# Patient Record
Sex: Male | Born: 1990 | Race: White | Hispanic: No | Marital: Single | Smoking: Never smoker
Health system: Southern US, Community
[De-identification: ages and names within clinical notes are randomized; demographics above are authoritative.]

## PROBLEM LIST (undated history)

## (undated) DIAGNOSIS — R42 Dizziness and giddiness: Secondary | ICD-10-CM

## (undated) DIAGNOSIS — F419 Anxiety disorder, unspecified: Secondary | ICD-10-CM

## (undated) DIAGNOSIS — R112 Nausea with vomiting, unspecified: Secondary | ICD-10-CM

## (undated) DIAGNOSIS — T4145XA Adverse effect of unspecified anesthetic, initial encounter: Secondary | ICD-10-CM

## (undated) DIAGNOSIS — B179 Acute viral hepatitis, unspecified: Secondary | ICD-10-CM

## (undated) DIAGNOSIS — B2710 Cytomegaloviral mononucleosis without complications: Secondary | ICD-10-CM

## (undated) DIAGNOSIS — T7840XA Allergy, unspecified, initial encounter: Secondary | ICD-10-CM

## (undated) DIAGNOSIS — F913 Oppositional defiant disorder: Secondary | ICD-10-CM

## (undated) DIAGNOSIS — R51 Headache: Secondary | ICD-10-CM

## (undated) DIAGNOSIS — R197 Diarrhea, unspecified: Secondary | ICD-10-CM

## (undated) DIAGNOSIS — K648 Other hemorrhoids: Secondary | ICD-10-CM

## (undated) DIAGNOSIS — F988 Other specified behavioral and emotional disorders with onset usually occurring in childhood and adolescence: Secondary | ICD-10-CM

## (undated) DIAGNOSIS — K3533 Acute appendicitis with perforation and localized peritonitis, with abscess: Secondary | ICD-10-CM

## (undated) DIAGNOSIS — Z9119 Patient's noncompliance with other medical treatment and regimen: Secondary | ICD-10-CM

## (undated) DIAGNOSIS — F411 Generalized anxiety disorder: Secondary | ICD-10-CM

## (undated) DIAGNOSIS — F319 Bipolar disorder, unspecified: Secondary | ICD-10-CM

## (undated) DIAGNOSIS — Z8489 Family history of other specified conditions: Secondary | ICD-10-CM

## (undated) DIAGNOSIS — L701 Acne conglobata: Secondary | ICD-10-CM

## (undated) DIAGNOSIS — K589 Irritable bowel syndrome without diarrhea: Secondary | ICD-10-CM

## (undated) HISTORY — DX: Generalized anxiety disorder: F41.1

## (undated) HISTORY — DX: Anxiety disorder, unspecified: F41.9

## (undated) HISTORY — DX: Irritable bowel syndrome, unspecified: K58.9

## (undated) HISTORY — DX: Allergy, unspecified, initial encounter: T78.40XA

## (undated) HISTORY — DX: Oppositional defiant disorder: F91.3

## (undated) HISTORY — DX: Acne conglobata: L70.1

## (undated) HISTORY — PX: KNEE SURGERY: SHX244

## (undated) HISTORY — DX: Acute appendicitis with perforation and localized peritonitis, with abscess: K35.33

## (undated) HISTORY — DX: Other specified behavioral and emotional disorders with onset usually occurring in childhood and adolescence: F98.8

## (undated) HISTORY — DX: Other hemorrhoids: K64.8

---
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
## 2013-09-23 ENCOUNTER — Other Ambulatory Visit: Payer: Self-pay | Admitting: Physician Assistant

## 2013-09-23 MED ORDER — SULFAMETHOXAZOLE-TRIMETHOPRIM 400-80 MG PO TABS: 1.0000 | ORAL_TABLET | Freq: Two times a day (BID) | ORAL | Status: AC

## 2013-10-24 ENCOUNTER — Other Ambulatory Visit: Payer: Self-pay | Admitting: Emergency Medicine

## 2013-11-04 ENCOUNTER — Other Ambulatory Visit: Payer: Self-pay | Admitting: Emergency Medicine

## 2013-11-07 DIAGNOSIS — F988 Other specified behavioral and emotional disorders with onset usually occurring in childhood and adolescence: Secondary | ICD-10-CM | POA: Insufficient documentation

## 2013-11-07 DIAGNOSIS — T7840XA Allergy, unspecified, initial encounter: Secondary | ICD-10-CM | POA: Insufficient documentation

## 2013-11-08 ENCOUNTER — Encounter: Payer: Self-pay | Admitting: Internal Medicine

## 2013-11-08 ENCOUNTER — Ambulatory Visit (INDEPENDENT_AMBULATORY_CARE_PROVIDER_SITE_OTHER): Payer: Self-pay | Admitting: Internal Medicine

## 2013-11-08 VITALS — BP 112/62 | HR 72 | Temp 98.2°F | Resp 18 | Wt 183.6 lb

## 2013-11-08 DIAGNOSIS — F913 Oppositional defiant disorder: Secondary | ICD-10-CM

## 2013-11-08 DIAGNOSIS — L701 Acne conglobata: Secondary | ICD-10-CM

## 2013-11-08 DIAGNOSIS — F319 Bipolar disorder, unspecified: Secondary | ICD-10-CM | POA: Insufficient documentation

## 2013-11-08 DIAGNOSIS — L708 Other acne: Secondary | ICD-10-CM

## 2013-11-08 HISTORY — DX: Acne conglobata: L70.1

## 2013-11-08 HISTORY — DX: Oppositional defiant disorder: F91.3

## 2013-11-08 MED ORDER — SULFAMETHOXAZOLE-TMP DS 800-160 MG PO TABS: 1.0000 | ORAL_TABLET | Freq: Two times a day (BID) | ORAL | Status: DC

## 2013-11-08 NOTE — Patient Instructions (Addendum)
Acne Acne is a skin problem that causes pimples. Acne occurs when the pores in your skin get blocked. Your pores may become red, sore, and swollen (inflamed), or infected with a common skin bacterium (Propionibacterium acnes). Acne is a common skin problem. Up to 80% of people get acne at some time. Acne is especially common from the ages of 76 to 25. Acne usually goes away over time with proper treatment. CAUSES  Your pores each contain an oil gland. The oil glands make an oily substance called sebum. Acne happens when these glands get plugged with sebum, dead skin cells, and dirt. The P. acnes bacteria that are normally found in the oil glands then multiply, causing inflammation. Acne is commonly triggered by changes in your hormones. These hormonal changes can cause the oil glands to get bigger and to make more sebum. Factors that can make acne worse include:  Hormone changes during adolescence.  Hormone changes during women's menstrual cycles.  Hormone changes during pregnancy.  Oil-based cosmetics and hair products.  Harshly scrubbing the skin.  Strong soaps.  Stress.  Hormone problems due to certain diseases.  Long or oily hair rubbing against the skin.  Certain medicines.  Pressure from headbands, backpacks, or shoulder pads.  Exposure to certain oils and chemicals. SYMPTOMS  Acne often occurs on the face, neck, chest, and upper back. Symptoms include:  Small, red bumps (pimples or papules).  Whiteheads (closed comedones).  Blackheads (open comedones).  Small, pus-filled pimples (pustules).  Big, red pimples or pustules that feel tender. More severe acne can cause:  An infected area that contains a collection of pus (abscess).  Hard, painful, fluid-filled sacs (cysts).  Scars. DIAGNOSIS  Your caregiver can usually tell what the problem is by doing a physical exam. TREATMENT  There are many good treatments for acne. Some are available over-the-counter and some  are available with a prescription. The treatment that is best for you depends on the type of acne you have and how severe it is. It may take 2 months of treatment before your acne gets better. Common treatments include:  Creams and lotions that prevent oil glands from clogging.  Creams and lotions that treat or prevent infections and inflammation.  Antibiotics applied to the skin or taken as a pill.  Pills that decrease sebum production.  Birth control pills.  Light or laser treatments.  Minor surgery.  Injections of medicine into the affected areas.  Chemicals that cause peeling of the skin. HOME CARE INSTRUCTIONS  Good skin care is the most important part of treatment.  Wash your skin gently at least twice a day and after exercise. Always wash your skin before bed.  Use mild soap.  After each wash, apply a water-based skin moisturizer.  Keep your hair clean and off of your face. Shampoo your hair daily.  Only take medicines as directed by your caregiver.  Use a sunscreen or sunblock with SPF 30 or greater. This is especially important when you are using acne medicines.  Choose cosmetics that are noncomedogenic. This means they do not plug the oil glands.  Avoid leaning your chin or forehead on your hands.  Avoid wearing tight headbands or hats.  Avoid picking or squeezing your pimples. This can make your acne worse and cause scarring. SEEK MEDICAL CARE IF:   Your acne is not better after 8 weeks.  Your acne gets worse.  You have a large area of skin that is red or tender. Document Released: 09/26/2000 Document  Revised: 12/22/2011 Document Reviewed: 07/18/2011 ExitCare Patient Information 2014 [place], [place].   Isotretinoin capsules What is this medicine? ISOTRETINOIN (eye soe TRET i noyn) treats severe acne that has not responded to other therapy like antibiotics. This medicine may be used for other purposes; ask your health care provider or pharmacist if you  have questions. COMMON BRAND NAME(S): Absorica, Accutane, Amnesteem , Claravis , MYORISAN, Sotret, ZENATANE What should I tell my health care provider before I take this medicine? They need to know if you have any of these conditions: -heart disease -high blood cholesterol or triglycerides -inflammatory bowel disease -liver disease -mental problems, such as depression, psychosis, attempted suicide, or a family history of mental problems -osteoporosis, osteomalacia, or other bone disorders -pancreatitis -an unusual or allergic reaction to isotretinoin, vitamin A or related drugs, parabens, other medicines, foods, dyes, or preservatives -pregnant or trying to get pregnant -breast-feeding How should I use this medicine? Take this medicine by mouth with a full glass of water. Follow the directions on the prescription label. Do not chew or suck on the capsules. Take your medicine at regular intervals. Do not take your medicine more often than directed. Do not stop taking except on your doctor's advice. A special MedGuide will be given to you by the pharmacist with each prescription and refill. Be sure to read this information carefully each time. Talk to your pediatrician regarding the use of this medicine in children. Special care may be needed. Overdosage: If you think you have taken too much of this medicine contact a poison control center or emergency room at once. NOTE: This medicine is only for you. Do not share this medicine with others. What if I miss a dose? If you miss a dose, skip that dose. Do not take double or extra doses. If you take more than your prescribed dose, call your doctor or poison control center right away. What may interact with this medicine? Do not take this medicine with any of the following medications: -vitamins and other supplements containing vitamin A This medicine may also interact with the following medications: -alcohol -benzoyl peroxide, salicylic acid, or  other drying medicines used for acne -medicines for seizures -orlistat -other drugs that make you more sensitive to the sun such as sulfa drugs -progestin-only birth control hormones -st. john's wort -steroid medicines like prednisone or cortisone -tetracycline antibiotics like doxycycline and tetracycline -warfarin This list may not describe all possible interactions. Give your health care provider a list of all the medicines, herbs, non-prescription drugs, or dietary supplements you use. Also tell them if you smoke, drink alcohol, or use illegal drugs. Some items may interact with your medicine. What should I watch for while using this medicine? You may experience a flare in your acne during the initial treatment period. You will need to see your doctor or health care professional monthly to get a new prescription and to check on your progress and for side effects. To receive this medicine, you, your doctor and your pharmacy must be registered in the [facility] program. You may only receive up to a 30 day supply of this medicine at one time. You will need a new prescription for each refill. Your prescription must be filled within 7 days of your doctor's office visit. This medicine can cause birth defects. Do not get pregnant while taking this drug. Females will need to have 2 negative pregnancy tests before starting this medicine and then monthly pregnancy tests during treatment, even if you are not sexually active.  Use 2 reliable forms of birth control together for 1 month prior to, during, and for 1 month after stopping this medicine. Avoid using birth control pills that do not contain estrogen. They may not work while you are taking this medicine. If you become pregnant, miss a menstrual cycle, or stop using birth control, you must immediately stop taking this medicine. If you are pregnant, report it to FDA MedWatch at 1-800-FDA-1088 and the [employer] pregnancy registry at [phone]. Severe birth  defects may occur even if just one dose is taken. Do not breast-feed while taking this medicine or for 1 month after stopping treatment. Do not give blood while taking this medicine and for 30 days after completion of treatment to avoid exposing pregnant women to this medicine through the donated blood. Some patients have become depressed or developed serious mental problems while taking this medicine or soon after stopping. Stop taking this medicine if you start feeling depressed or have thoughts of violence or suicide. Contact your doctor. This medicine can increase cholesterol and triglyceride levels and decrease HDL (the good cholesterol) levels. Your health care provider will monitor these levels and recommend appropriate therapy, including changes in diet or prescription drugs, if necessary. Alcohol can increase the risk of developing high cholesterol or high blood lipids. Avoid alcoholic drinks while you are taking this medicine. If you wear contact lenses, they may feel uncomfortable. If your eyes get dry, check with your eye doctor. This medicine may decrease your night vision or cause other changes in vision. If you experience any change in vision, stop taking this medicine and see an eye doctor. This medicine can make you more sensitive to the sun. Keep out of the sun. If you cannot avoid being in the sun, wear protective clothing and use sunscreen. Do not use sun lamps or tanning beds/booths. Cosmetic procedures to smooth your skin including waxing, dermabrasion, or laser therapy should be avoided during therapy and for at least 6 months after you stop because of the possibility of scarring. Check with your health care provider for advice about when you can have cosmetic procedures. This medicine may affect your blood sugar levels. If you have diabetes check with your doctor or health care professional if you notice any change in your blood sugar tests. What side effects may I notice from  receiving this medicine? Side effects that you should report to your doctor or health care professional as soon as possible: -allergic reactions like skin rash, itching or hives, swelling of the face, lips, or tongue -breathing problems -changes in menstrual cycle -changes in vision, like blurred or double vision or decreased night vision -chest pain -depression -dizziness -fainting -hearing loss or ringing in the ears -hives, skin rash -increased irritability, anger, aggression or thoughts of violence -increased urination and/or thirst or dark urine -irregular heartbeat -loss of interest in usual activities -muscle or joint pain -muscle weakness with or without pain -nausea and vomiting -severe diarrhea -severe headache -severe stomach pain -slurred speech or trouble swallowing -start to have thoughts about hurting yourself -swelling of face or mouth -unusual bruising or bleeding -yellowing of the eyes or skin Side effects that usually do not require medical attention (report to your doctor or health care professional if they continue or are bothersome): -chapped lips -dry mouth, nose or skin -flushing -hair loss, increased fragility of hair -headache (mild) This list may not describe all possible side effects. Call your doctor for medical advice about side effects. You may report side effects  to FDA at 1-800-FDA-1088. Where should I keep my medicine? Keep out of the reach of children. Store at room temperature between 15 and 30 degrees C (59 and 86 degrees F). Throw away any unused medicine after the expiration date. NOTE: This sheet is a summary. It may not cover all possible information. If you have questions about this medicine, talk to your doctor, pharmacist, or health care provider.  2014, Elsevier/Gold Standard. (2008-02-15 16:57:13)

## 2013-11-08 NOTE — Progress Notes (Signed)
   Subjective:    Patient ID: Blake Bradley, male    DOB: 11/15/1990, 23 y.o.   MRN: [id number]  HPI Blake Bradley presents today for f/u of his acne. In the past he has been on various Abx's and more recently he had bee tried on low dose alternate day dosing with some limited success.     Review of Systems 12 pt systems review neg to above.     Objective:   Physical Exam Skin exam reveals mild cystic acne of the face and upper back. No signs of cellulitis or abcesses.       Assessment & Plan:   1. Acne conglobata  - sulfamethoxazole-trimethoprim (BACTRIM DS) 800-160 MG per tablet; Take 1 tablet by mouth 2 (two) times daily. With food for Acne  Dispense: 60 tablet; Refill: 5  - Patient was advised that when he does procure insurance it would be reasonable to pursue a Dermatology evaluation

## 2013-11-09 ENCOUNTER — Other Ambulatory Visit: Payer: Self-pay | Admitting: Emergency Medicine

## 2013-11-09 ENCOUNTER — Telehealth: Payer: Self-pay | Admitting: *Deleted

## 2013-11-09 NOTE — Telephone Encounter (Signed)
Patient's parent called and requested 1 tab of Dexamethasone for rash onhands,back,face and legs.  Per Dr Oneta RackMcKeown, no rx and try Benadryl until OV on [id number].  Father aware.

## 2013-11-10 ENCOUNTER — Ambulatory Visit: Payer: Self-pay | Admitting: Internal Medicine

## 2013-11-10 ENCOUNTER — Encounter: Payer: Self-pay | Admitting: Internal Medicine

## 2013-11-10 VITALS — BP 112/66 | HR 52 | Temp 99.0°F | Resp 16 | Wt 182.5 lb

## 2013-11-10 DIAGNOSIS — L701 Acne conglobata: Secondary | ICD-10-CM

## 2013-11-10 NOTE — Progress Notes (Signed)
   Subjective:    Patient ID: Blake Bradley, male    DOB: 04/12/1991, 23 y.o.   MRN: [id number]  HPI  Blake Bradley was seen 2 days ago and Rx'd  SXT-DS for acne an d called yesterday claiming an allergic reaction or worsening of the acne on his back    Review of Systems Neg      Objective:   Physical Exam Facial acne appears about the same. Acne rash of back also about the same        Assessment & Plan:   Acne (706.1)  Reassured not an allergic reaction and that sometimes starting an Abx may transiently exacerbate the acneiform rash.  Has appt in 10 days with a Dermatoloist - Dr Yetta BarreJones

## 2013-11-11 ENCOUNTER — Ambulatory Visit: Payer: Self-pay | Admitting: Emergency Medicine

## 2014-04-06 ENCOUNTER — Encounter: Payer: Self-pay | Admitting: Internal Medicine

## 2014-04-17 ENCOUNTER — Ambulatory Visit: Payer: Self-pay | Admitting: Physician Assistant

## 2014-04-18 ENCOUNTER — Encounter: Payer: Self-pay | Admitting: Internal Medicine

## 2014-04-18 ENCOUNTER — Ambulatory Visit: Payer: Self-pay | Admitting: Internal Medicine

## 2014-04-18 VITALS — BP 116/74 | HR 56 | Temp 98.4°F | Resp 16 | Ht 73.0 in | Wt 161.2 lb

## 2014-04-18 DIAGNOSIS — N503 Cyst of epididymis: Secondary | ICD-10-CM

## 2014-04-18 NOTE — Progress Notes (Signed)
   Subjective:    Patient ID: Blake Bradley, male    DOB: 02/17/1991, 23 y.o.   MRN: [id number]  HPI Patient present with concern Re: small BB sized sl tender lump of the right epididymal area.  Review of Systems neg  Objective:   Physical Exam BP 116/74  Pulse 56  Temp(Src) 98.4 F (36.9 C) (Temporal)  Resp 16  Ht 6\' 1"  (1.854 m)  Wt 161 lb 3.2 oz (73.12 kg)  BMI 21.27 kg/m2 Exam focused to the genital areas finds normal male, no inguinal hernias, bilat. testes and cords normal & unremarkable with finding of a firm 2-3 mm sized firm nodule of the right epididymis.  Assessment & Plan:   1. Epididymal cyst - patient reassured not likely cancer. - patient instructed in STE & given handout for same Yaw Testicular Exam. - ROV prn.

## 2014-06-18 ENCOUNTER — Encounter (HOSPITAL_COMMUNITY): Payer: Self-pay | Admitting: Emergency Medicine

## 2014-06-18 ENCOUNTER — Emergency Department (INDEPENDENT_AMBULATORY_CARE_PROVIDER_SITE_OTHER)
Admission: EM | Admit: 2014-06-18 | Discharge: 2014-06-18 | Disposition: A | Discharge status: Home / Self Care | Payer: Medicaid Other | Source: Home / Self Care | Attending: Family Medicine | Admitting: Family Medicine

## 2014-06-18 DIAGNOSIS — K529 Noninfective gastroenteritis and colitis, unspecified: Secondary | ICD-10-CM

## 2014-06-18 DIAGNOSIS — E860 Dehydration: Secondary | ICD-10-CM

## 2014-06-18 DIAGNOSIS — K5289 Other specified noninfective gastroenteritis and colitis: Secondary | ICD-10-CM

## 2014-06-18 LAB — POCT URINALYSIS DIP (DEVICE)
GLUCOSE, UA: NEGATIVE mg/dL
Hgb urine dipstick: NEGATIVE
LEUKOCYTES UA: NEGATIVE
NITRITE: NEGATIVE
PH: 6 (ref 5.0–8.0)
Protein, ur: >=300 mg/dL — AB
Specific Gravity, Urine: 1.025 (ref 1.005–1.030)
Urobilinogen, UA: 0.2 mg/dL (ref 0.0–1.0)

## 2014-06-18 MED ORDER — ONDANSETRON HCL 4 MG/2ML IJ SOLN
8.0000 mg | Freq: Once | INTRAMUSCULAR | Status: AC
  Administered 2014-06-18: 8 mg via INTRAVENOUS

## 2014-06-18 MED ORDER — ONDANSETRON 4 MG PO TBDP: 4.0000 mg | ORAL_TABLET | Freq: Three times a day (TID) | ORAL | Status: DC | PRN

## 2014-06-18 MED ORDER — SODIUM CHLORIDE 0.9 % IV SOLN
Freq: Once | INTRAVENOUS | Status: AC
  Administered 2014-06-18 (×2): via INTRAVENOUS

## 2014-06-18 MED ORDER — SODIUM CHLORIDE 0.9 % IV SOLN: Freq: Once | INTRAVENOUS | Status: DC

## 2014-06-18 MED ORDER — ONDANSETRON HCL 4 MG/2ML IJ SOLN
INTRAMUSCULAR | Status: AC
  Filled 2014-06-18: qty 4

## 2014-06-18 NOTE — ED Provider Notes (Signed)
CSN: [id number]     Arrival date & time 06/18/14  1204 History   First MD Initiated Contact with Patient 06/18/14 1214     Chief Complaint  Patient presents with  . Diarrhea   (Consider location/radiation/quality/duration/timing/severity/associated sxs/prior Treatment) HPI Comments: Patient reports 4 days of N/V/D with associated abdominal cramping and anorexia. Patient's mother had been ill with same for a few days prior to patient. Reports himself to be otherwise healthy. No hx of abdominal surgery. No fever. Does not smoke or drink. Is unemployed.  PCP: Dr. Rosalita Bradley  Patient is a 23 y.o. male presenting with vomiting. The history is provided by the patient.  Emesis Severity:  Moderate Duration:  4 days Timing:  Constant Quality:  Stomach contents   Past Medical History  Diagnosis Date  . Allergy   . ADD (attention deficit disorder)    Past Surgical History  Procedure Laterality Date  . Knee surgery      recurrent spitz tumor   Family History  Problem Relation Age of Onset  . Cancer Mother     hx breast cancer   History  Substance Use Topics  . Smoking status: Never Smoker   . Smokeless tobacco: Not on file  . Alcohol Use: No    Review of Systems  Gastrointestinal: Positive for vomiting.  All other systems reviewed and are negative.   Allergies  Review of patient's allergies indicates no known allergies.  Home Medications   Prior to Admission medications   Medication Sig Start Date End Date Taking? Authorizing Provider  ISOtretinoin (ACCUTANE) 40 MG capsule Take 40 mg by mouth daily.    Historical Provider, MD  ondansetron (ZOFRAN ODT) 4 MG disintegrating tablet Take 1 tablet (4 mg total) by mouth every 8 (eight) hours as needed for nausea or vomiting. 06/18/14   Jess Barters H Kassandra Meriweather, PA   BP 133/80  Pulse 101  Temp(Src) 100 F (37.8 C) (Oral)  Resp 14  SpO2 99% Physical Exam  Nursing note and vitals reviewed. Constitutional: He is oriented to  person, place, and time. He appears well-developed and well-nourished. No distress.  HENT:  Head: Normocephalic and atraumatic.  Eyes: Conjunctivae are normal. No scleral icterus.  Cardiovascular: Normal rate, regular rhythm and normal heart sounds.   Pulmonary/Chest: Effort normal and breath sounds normal. No respiratory distress. He has no wheezes.  Abdominal: Normal appearance. He exhibits no distension and no mass. Bowel sounds are increased. There is no CVA tenderness.  Mild diffuse tenderness and cramping  Musculoskeletal: Normal range of motion.  Neurological: He is alert and oriented to person, place, and time.  Skin: Skin is dry. No rash noted.  Psychiatric: He has a normal mood and affect. His behavior is normal.    ED Course  Procedures (including critical care time) Labs Review Labs Reviewed  POCT URINALYSIS DIP (DEVICE) - Abnormal; Notable for the following:    Bilirubin Urine SMALL (*)    Ketones, ur TRACE (*)    Protein, ur >=300 (*)    All other components within normal limits    Imaging Review No results found.   MDM   1. Gastroenteritis   2. Dehydration    Patient provided with 1L NS IV at [facility] with 8 mg IV dose of Zofran. Following fluids and meds, patient given oral fluid challenge. Patient tolerated oral fluids and states he is feeling better. Will discahrge home with Rx for ODT zofran and instructions for oral rehydration and advised to have himself  reassessed if symptoms do not begin to improve over next several days.   UA with proteinuria and trace ketonuria with normal spec Blake Bradley, [place] 06/18/14 1446  Addendum :05pm 06/18/2014: Istat 8 with mild hyponatremia (132) and hyperglycemia (129), otherwise unremarkable.   Ria Clock, [place] 06/18/14 [phone]

## 2014-06-18 NOTE — ED Notes (Signed)
Pt  Has  Had  4  Days  Of  Nausea/  Vomiting    /  Diarrhea       Has  Not vomited  Today    But did last pm      Feels  Weak  And       Mucous  membranes  Are  Dry

## 2014-06-18 NOTE — ED Notes (Signed)
Iv  Ns 1  Liter    Bolus       r  Arm      20  Angio  Site  patent

## 2014-06-18 NOTE — Discharge Instructions (Signed)
Dehydration, Adult °Dehydration is when you lose more fluids from the body than you take in. Vital organs like the kidneys, brain, and heart cannot function without a proper amount of fluids and salt. Any loss of fluids from the body can cause dehydration.  °CAUSES  °· Vomiting. °· Diarrhea. °· Excessive sweating. °· Excessive urine output. °· Fever. °SYMPTOMS  °Mild dehydration °· Thirst. °· Dry lips. °· Slightly dry mouth. °Moderate dehydration °· Very dry mouth. °· Sunken eyes. °· Skin does not bounce back quickly when lightly pinched and released. °· Dark urine and decreased urine production. °· Decreased tear production. °· Headache. °Severe dehydration °· Very dry mouth. °· Extreme thirst. °· Rapid, weak pulse (more than 100 beats per minute at rest). °· Cold hands and feet. °· Not able to sweat in spite of heat and temperature. °· Rapid breathing. °· Blue lips. °· Confusion and lethargy. °· Difficulty being awakened. °· Minimal urine production. °· No tears. °DIAGNOSIS  °Your caregiver will diagnose dehydration based on your symptoms and your exam. Blood and urine tests will help confirm the diagnosis. The diagnostic evaluation should also identify the cause of dehydration. °TREATMENT  °Treatment of mild or moderate dehydration can often be done at home by increasing the amount of fluids that you drink. It is best to drink small amounts of fluid more often. Drinking too much at one time can make vomiting worse. Refer to the home care instructions below. °Severe dehydration needs to be treated at the hospital where you will probably be given intravenous (IV) fluids that contain water and electrolytes. °HOME CARE INSTRUCTIONS  °· Ask your caregiver about specific rehydration instructions. °· Drink enough fluids to keep your urine clear or pale yellow. °· Drink small amounts frequently if you have nausea and vomiting. °· Eat as you normally do. °· Avoid: °¨ Foods or drinks high in sugar. °¨ Carbonated  drinks. °¨ Juice. °¨ Extremely hot or cold fluids. °¨ Drinks with caffeine. °¨ Fatty, greasy foods. °¨ Alcohol. °¨ Tobacco. °¨ Overeating. °¨ Gelatin desserts. °· Wash your hands well to avoid spreading bacteria and viruses. °· Only take over-the-counter or prescription medicines for pain, discomfort, or fever as directed by your caregiver. °· Ask your caregiver if you should continue all prescribed and over-the-counter medicines. °· Keep all follow-up appointments with your caregiver. °SEEK MEDICAL CARE IF: °· You have abdominal pain and it increases or stays in one area (localizes). °· You have a rash, stiff neck, or severe headache. °· You are irritable, sleepy, or difficult to awaken. °· You are weak, dizzy, or extremely thirsty. °SEEK IMMEDIATE MEDICAL CARE IF:  °· You are unable to keep fluids down or you get worse despite treatment. °· You have frequent episodes of vomiting or diarrhea. °· You have blood or green matter (bile) in your vomit. °· You have blood in your stool or your stool looks black and tarry. °· You have not urinated in 6 to 8 hours, or you have only urinated a small amount of very dark urine. °· You have a fever. °· You faint. °MAKE SURE YOU:  °· Understand these instructions. °· Will watch your condition. °· Will get help right away if you are not doing well or get worse. °Document Released: 09/29/2005 Document Revised: 12/22/2011 Document Reviewed: 05/19/2011 °[employer]® Patient Information ©2015 [employer]. This information is not intended to replace advice given to you by your health care provider. Make sure you discuss any questions you have with your health care   provider.  Rehydration, Adult Rehydration is the replacement of body fluids lost during dehydration. Dehydration is an extreme loss of body fluids to the point of body function impairment. There are many ways extreme fluid loss can occur, including vomiting, diarrhea, or excess sweating. Recovering from dehydration  requires replacing lost fluids, continuing to eat to maintain strength, and avoiding foods and beverages that may contribute to further fluid loss or may increase nausea. HOW TO REHYDRATE In most cases, rehydration involves the replacement of not only fluids but also carbohydrates and basic body salts. Rehydration with an oral rehydration solution is one way to replace essential nutrients lost through dehydration. An oral rehydration solution can be purchased at pharmacies, retail stores, and online. Premixed packets of powder that you combine with water to make a solution are also sold. You can prepare an oral rehydration solution at home by mixing the following ingredients together:    - tsp table salt.   tsp baking soda.   tsp salt substitute containing potassium chloride.  1 tablespoons sugar.  1 L (34 oz) of water. Be sure to use exact measurements. Including too much sugar can make diarrhea worse. Drink -1 cup (120-240 mL) of oral rehydration solution each time you have diarrhea or vomit. If drinking this amount makes your vomiting worse, try drinking smaller amounts more often. For example, drink 1-3 tsp every 5-10 minutes.  A general rule for staying hydrated is to drink 1-2 L of fluid per day. Talk to your caregiver about the specific amount you should be drinking each day. Drink enough fluids to keep your urine clear or pale yellow. EATING WHEN DEHYDRATED Even if you have had severe sweating or you are having diarrhea, do not stop eating. Many healthy items in a normal diet are okay to continue eating while recovering from dehydration. The following tips can help you to lessen nausea when you eat:  Ask someone else to prepare your food. Cooking smells may worsen nausea.  Eat in a well-ventilated room away from cooking smells.  Sit up when you eat. Avoid lying down until 1-2 hours after eating.  Eat small amounts when you eat.  Eat foods that are easy to digest. These include  soft, well-cooked, or mashed foods. FOODS AND BEVERAGES TO AVOID Avoid eating or drinking the following foods and beverages that may increase nausea or further loss of fluid:   Fruit juices with a high sugar content, such as concentrated juices.  Alcohol.  Beverages containing caffeine.  Carbonated drinks. They may cause a lot of gas.  Foods that may cause a lot of gas, such as cabbage, broccoli, and beans.  Fatty, greasy, and fried foods.  Spicy, very salty, and very sweet foods or drinks.  Foods or drinks that are very hot or very cold. Consume food or drinks at or near room temperature.  Foods that need a lot of chewing, such as raw vegetables.  Foods that are sticky or hard to swallow, such as peanut butter. Document Released: 12/22/2011 Document Revised: 06/23/2012 Document Reviewed: 12/22/2011 [facility] Patient Information 2015 [place], [place]. This information is not intended to replace advice given to you by your health care provider. Make sure you discuss any questions you have with your health care provider.  Viral Gastroenteritis Viral gastroenteritis is also known as stomach flu. This condition affects the stomach and intestinal tract. It can cause sudden diarrhea and vomiting. The illness typically lasts 3 to 8 days. Most people develop an immune response that eventually  gets rid of the virus. While this natural response develops, the virus can make you quite ill. CAUSES  Many different viruses can cause gastroenteritis, such as rotavirus or noroviruses. You can catch one of these viruses by consuming contaminated food or water. You may also catch a virus by sharing utensils or other personal items with an infected person or by touching a contaminated surface. SYMPTOMS  The most common symptoms are diarrhea and vomiting. These problems can cause a severe loss of body fluids (dehydration) and a body salt (electrolyte) imbalance. Other symptoms may  include:  Fever.  Headache.  Fatigue.  Abdominal pain. DIAGNOSIS  Your caregiver can usually diagnose viral gastroenteritis based on your symptoms and a physical exam. A stool sample may also be taken to test for the presence of viruses or other infections. TREATMENT  This illness typically goes away on its own. Treatments are aimed at rehydration. The most serious cases of viral gastroenteritis involve vomiting so severely that you are not able to keep fluids down. In these cases, fluids must be given through an intravenous line (IV). HOME CARE INSTRUCTIONS   Drink enough fluids to keep your urine clear or pale yellow. Drink small amounts of fluids frequently and increase the amounts as tolerated.  Ask your caregiver for specific rehydration instructions.  Avoid:  Foods high in sugar.  Alcohol.  Carbonated drinks.  Tobacco.  Juice.  Caffeine drinks.  Extremely hot or cold fluids.  Fatty, greasy foods.  Too much intake of anything at one time.  Dairy products until 24 to 48 hours after diarrhea stops.  You may consume probiotics. Probiotics are active cultures of beneficial bacteria. They may lessen the amount and number of diarrheal stools in adults. Probiotics can be found in yogurt with active cultures and in supplements.  Wash your hands well to avoid spreading the virus.  Only take over-the-counter or prescription medicines for pain, discomfort, or fever as directed by your caregiver. Do not give aspirin to children. Antidiarrheal medicines are not recommended.  Ask your caregiver if you should continue to take your regular prescribed and over-the-counter medicines.  Keep all follow-up appointments as directed by your caregiver. SEEK IMMEDIATE MEDICAL CARE IF:   You are unable to keep fluids down.  You do not urinate at least once every 6 to 8 hours.  You develop shortness of breath.  You notice blood in your stool or vomit. This may look like coffee  grounds.  You have abdominal pain that increases or is concentrated in one small area (localized).  You have persistent vomiting or diarrhea.  You have a fever.  The patient is a child younger than 3 months, and he or she has a fever.  The patient is a child older than 3 months, and he or she has a fever and persistent symptoms.  The patient is a child older than 3 months, and he or she has a fever and symptoms suddenly get worse.  The patient is a baby, and he or she has no tears when crying. MAKE SURE YOU:   Understand these instructions.  Will watch your condition.  Will get help right away if you are not doing well or get worse. Document Released: 09/29/2005 Document Revised: 12/22/2011 Document Reviewed: 07/16/2011 [facility] Patient Information 2015 [place], [place]. This information is not intended to replace advice given to you by your health care provider. Make sure you discuss any questions you have with your health care provider.

## 2014-06-19 ENCOUNTER — Emergency Department (INDEPENDENT_AMBULATORY_CARE_PROVIDER_SITE_OTHER)
Admission: EM | Admit: 2014-06-19 | Discharge: 2014-06-19 | Disposition: A | Discharge status: Nursing Home (Includes ICF) | Payer: Medicaid Other | Source: Home / Self Care

## 2014-06-19 ENCOUNTER — Inpatient Hospital Stay (HOSPITAL_COMMUNITY)
Admission: EM | Admit: 2014-06-19 | Discharge: 2014-06-28 | DRG: 392 | Disposition: A | Discharge status: Home / Self Care | Payer: Medicaid Other | Attending: Internal Medicine | Admitting: Internal Medicine

## 2014-06-19 ENCOUNTER — Encounter (HOSPITAL_COMMUNITY): Payer: Self-pay | Admitting: Emergency Medicine

## 2014-06-19 ENCOUNTER — Emergency Department (HOSPITAL_COMMUNITY): Payer: Medicaid Other

## 2014-06-19 DIAGNOSIS — F988 Other specified behavioral and emotional disorders with onset usually occurring in childhood and adolescence: Secondary | ICD-10-CM

## 2014-06-19 DIAGNOSIS — R809 Proteinuria, unspecified: Secondary | ICD-10-CM | POA: Diagnosis present

## 2014-06-19 DIAGNOSIS — F319 Bipolar disorder, unspecified: Secondary | ICD-10-CM

## 2014-06-19 DIAGNOSIS — L701 Acne conglobata: Secondary | ICD-10-CM

## 2014-06-19 DIAGNOSIS — R1115 Cyclical vomiting syndrome unrelated to migraine: Secondary | ICD-10-CM

## 2014-06-19 DIAGNOSIS — R197 Diarrhea, unspecified: Secondary | ICD-10-CM

## 2014-06-19 DIAGNOSIS — R1084 Generalized abdominal pain: Secondary | ICD-10-CM

## 2014-06-19 DIAGNOSIS — K56609 Unspecified intestinal obstruction, unspecified as to partial versus complete obstruction: Secondary | ICD-10-CM | POA: Diagnosis not present

## 2014-06-19 DIAGNOSIS — R509 Fever, unspecified: Secondary | ICD-10-CM

## 2014-06-19 DIAGNOSIS — R638 Other symptoms and signs concerning food and fluid intake: Secondary | ICD-10-CM

## 2014-06-19 DIAGNOSIS — R112 Nausea with vomiting, unspecified: Secondary | ICD-10-CM | POA: Diagnosis present

## 2014-06-19 DIAGNOSIS — L509 Urticaria, unspecified: Secondary | ICD-10-CM | POA: Diagnosis not present

## 2014-06-19 DIAGNOSIS — L708 Other acne: Secondary | ICD-10-CM | POA: Diagnosis present

## 2014-06-19 DIAGNOSIS — K56600 Partial intestinal obstruction, unspecified as to cause: Secondary | ICD-10-CM | POA: Diagnosis present

## 2014-06-19 DIAGNOSIS — K529 Noninfective gastroenteritis and colitis, unspecified: Secondary | ICD-10-CM

## 2014-06-19 DIAGNOSIS — A09 Infectious gastroenteritis and colitis, unspecified: Principal | ICD-10-CM | POA: Diagnosis present

## 2014-06-19 DIAGNOSIS — R109 Unspecified abdominal pain: Secondary | ICD-10-CM

## 2014-06-19 DIAGNOSIS — F913 Oppositional defiant disorder: Secondary | ICD-10-CM

## 2014-06-19 DIAGNOSIS — R1110 Vomiting, unspecified: Secondary | ICD-10-CM

## 2014-06-19 DIAGNOSIS — E876 Hypokalemia: Secondary | ICD-10-CM | POA: Diagnosis present

## 2014-06-19 DIAGNOSIS — R933 Abnormal findings on diagnostic imaging of other parts of digestive tract: Secondary | ICD-10-CM

## 2014-06-19 LAB — CBC WITH DIFFERENTIAL/PLATELET
BASOS PCT: 0 % (ref 0–1)
Basophils Absolute: 0 10*3/uL (ref 0.0–0.1)
Eosinophils Absolute: 0.1 10*3/uL (ref 0.0–0.7)
Eosinophils Relative: 1 % (ref 0–5)
HEMATOCRIT: 42.3 % (ref 39.0–52.0)
HEMOGLOBIN: 15.2 g/dL (ref 13.0–17.0)
LYMPHS PCT: 5 % — AB (ref 12–46)
Lymphs Abs: 0.7 10*3/uL (ref 0.7–4.0)
MCH: 30.3 pg (ref 26.0–34.0)
MCHC: 35.9 g/dL (ref 30.0–36.0)
MCV: 84.4 fL (ref 78.0–100.0)
MONOS PCT: 9 % (ref 3–12)
Monocytes Absolute: 1.2 10*3/uL — ABNORMAL HIGH (ref 0.1–1.0)
NEUTROS ABS: 10.6 10*3/uL — AB (ref 1.7–7.7)
NEUTROS PCT: 85 % — AB (ref 43–77)
Platelets: 211 10*3/uL (ref 150–400)
RBC: 5.01 MIL/uL (ref 4.22–5.81)
RDW: 12.5 % (ref 11.5–15.5)
WBC: 12.6 10*3/uL — AB (ref 4.0–10.5)

## 2014-06-19 LAB — URINALYSIS, ROUTINE W REFLEX MICROSCOPIC
GLUCOSE, UA: NEGATIVE mg/dL
Hgb urine dipstick: NEGATIVE
KETONES UR: 15 mg/dL — AB
Nitrite: NEGATIVE
PH: 6 (ref 5.0–8.0)
Protein, ur: 100 mg/dL — AB
Specific Gravity, Urine: 1.031 — ABNORMAL HIGH (ref 1.005–1.030)
Urobilinogen, UA: 1 mg/dL (ref 0.0–1.0)

## 2014-06-19 LAB — COMPREHENSIVE METABOLIC PANEL
ALBUMIN: 3.5 g/dL (ref 3.5–5.2)
ALK PHOS: 93 U/L (ref 39–117)
ALT: 21 U/L (ref 0–53)
AST: 26 U/L (ref 0–37)
Anion gap: 16 — ABNORMAL HIGH (ref 5–15)
BILIRUBIN TOTAL: 1.1 mg/dL (ref 0.3–1.2)
BUN: 15 mg/dL (ref 6–23)
CHLORIDE: 97 meq/L (ref 96–112)
CO2: 24 meq/L (ref 19–32)
Calcium: 9.3 mg/dL (ref 8.4–10.5)
Creatinine, Ser: 1.08 mg/dL (ref 0.50–1.35)
GFR calc Af Amer: >90 mL/min (ref >90)
Glucose, Bld: 98 mg/dL (ref 70–99)
POTASSIUM: 3.6 meq/L — AB (ref 3.7–5.3)
Sodium: 137 mEq/L (ref 137–147)
Total Protein: 7.8 g/dL (ref 6.0–8.3)

## 2014-06-19 LAB — URINE MICROSCOPIC-ADD ON

## 2014-06-19 LAB — I-STAT CG4 LACTIC ACID, ED: LACTIC ACID, VENOUS: 1.24 mmol/L (ref 0.5–2.2)

## 2014-06-19 LAB — LIPASE, BLOOD: Lipase: 38 U/L (ref 11–59)

## 2014-06-19 MED ORDER — HYDROMORPHONE HCL PF 1 MG/ML IJ SOLN
1.0000 mg | Freq: Once | INTRAMUSCULAR | Status: AC
  Administered 2014-06-19: 1 mg via INTRAVENOUS
  Filled 2014-06-19: qty 1

## 2014-06-19 MED ORDER — IOHEXOL 300 MG/ML  SOLN: 25.0000 mL | INTRAMUSCULAR | Status: AC

## 2014-06-19 MED ORDER — MORPHINE SULFATE 4 MG/ML IJ SOLN
4.0000 mg | Freq: Once | INTRAMUSCULAR | Status: AC
  Administered 2014-06-19: 4 mg via INTRAVENOUS
  Filled 2014-06-19: qty 1

## 2014-06-19 MED ORDER — HYDROMORPHONE HCL PF 1 MG/ML IJ SOLN
1.0000 mg | Freq: Once | INTRAMUSCULAR | Status: AC
  Administered 2014-06-20: 1 mg via INTRAVENOUS
  Filled 2014-06-19: qty 1

## 2014-06-19 MED ORDER — ONDANSETRON HCL 4 MG/2ML IJ SOLN
4.0000 mg | Freq: Once | INTRAMUSCULAR | Status: AC
  Administered 2014-06-19: 4 mg via INTRAVENOUS
  Filled 2014-06-19: qty 2

## 2014-06-19 MED ORDER — SODIUM CHLORIDE 0.9 % IV BOLUS (SEPSIS)
2000.0000 mL | Freq: Once | INTRAVENOUS | Status: AC
  Administered 2014-06-19: 2000 mL via INTRAVENOUS

## 2014-06-19 MED ORDER — ONDANSETRON HCL 4 MG/2ML IJ SOLN
4.0000 mg | Freq: Once | INTRAMUSCULAR | Status: AC
  Administered 2014-06-20: 4 mg via INTRAVENOUS
  Filled 2014-06-19: qty 2

## 2014-06-19 MED ORDER — IOHEXOL 300 MG/ML  SOLN
100.0000 mL | Freq: Once | INTRAMUSCULAR | Status: AC | PRN
  Administered 2014-06-19: 100 mL via INTRAVENOUS

## 2014-06-19 NOTE — ED Notes (Signed)
Patient/father refused ambulance transport.  Father transferring patient to ed.  Understands to go to ed now, no food, no drinking.

## 2014-06-19 NOTE — ED Provider Notes (Signed)
Medical screening examination/treatment/procedure(s) were performed by non-physician practitioner and as supervising physician I was immediately available for consultation/collaboration.  Haizel Gatchell, M.D.  Rithik Odea C Thailyn Khalid, MD 06/19/14 1801 

## 2014-06-19 NOTE — ED Notes (Signed)
Pt reports that since Thursday he has had abd pain and vomiting. Reports that he went to [facility] yesterday and given medication. FAmily reports that he has not gotten any better and unable to hold anything down. REports that patient is pale and diaphoretic at home.

## 2014-06-19 NOTE — ED Provider Notes (Signed)
CSN: [id number]     Arrival date & time 06/19/14  1708 History   First MD Initiated Contact with Patient 06/19/14 1853     Chief Complaint  Patient presents with  . Abdominal Pain  . Fever     (Consider location/radiation/quality/duration/timing/severity/associated sxs/prior Treatment) The history is provided by the patient and medical records. No language interpreter was used.    Carlisle Harbuck is a 23 y.o. male  with a hx of allergic rhinitis presents to the Emergency Department complaining of gradual, persistent, progressively worsening lower abd pain onset 2 days ago. Associated symptoms include N/V/D everyday since then.  Pt was seen at [facility] yesterday, given fluids, zofran and sent home.  Patient reports is unable to tolerate by mouth for several days.  Patient denies urinary symptoms, penile discharge, testicular pain, penile pain. No history of abdominal surgeries.  Family of patient reports fevers of 103 with diaphoresis at home.  Patient reports that pain is better with sitting or curling up but worsened with lying flat or standing. Patient reports that the bumps in the car ride over not getting significantly worse. no other aggravating or alleviating factors.  Pt denies headache, neck pain, chest pain, shortness of breath, syncope, dysuria and hematuria.  Patient reports emesis is nonbloody, nonbilious and diarrhea has been without melena or hematochezia.  Past Medical History  Diagnosis Date  . Allergy   . ADD (attention deficit disorder)    Past Surgical History  Procedure Laterality Date  . Knee surgery      recurrent spitz tumor   Family History  Problem Relation Age of Onset  . Cancer Mother     hx breast cancer   History  Substance Use Topics  . Smoking status: Never Smoker   . Smokeless tobacco: Not on file  . Alcohol Use: No    Review of Systems  Constitutional: Positive for fever, appetite change and fatigue. Negative for diaphoresis and unexpected weight change.   HENT: Negative for mouth sores and trouble swallowing.   Eyes: Negative for visual disturbance.  Respiratory: Negative for cough, chest tightness, shortness of breath, wheezing and stridor.   Cardiovascular: Negative for chest pain and palpitations.  Gastrointestinal: Positive for nausea, vomiting, abdominal pain and diarrhea. Negative for constipation, blood in stool, abdominal distention and rectal pain.  Endocrine: Negative for polydipsia, polyphagia and polyuria.  Genitourinary: Negative for dysuria, urgency, frequency, hematuria, flank pain and difficulty urinating.  Musculoskeletal: Negative for back pain, neck pain and neck stiffness.  Skin: Negative for rash.  Allergic/Immunologic: Negative for immunocompromised state.  Neurological: Negative for syncope, weakness, light-headedness and headaches.  Hematological: Negative for adenopathy. Does not bruise/bleed easily.  Psychiatric/Behavioral: Negative for confusion and sleep disturbance. The patient is not nervous/anxious.   All other systems reviewed and are negative.     Allergies  Prednisone  Home Medications   Prior to Admission medications   Medication Sig Start Date End Date Taking? Authorizing Provider  cetirizine (ZYRTEC) 10 MG tablet Take 10 mg by mouth daily.   Yes Historical Provider, MD  ibuprofen (ADVIL,MOTRIN) 200 MG tablet Take 200 mg by mouth every 6 (six) hours as needed (pain).   Yes Historical Provider, MD  ISOtretinoin (ACCUTANE) 40 MG capsule Take 40 mg by mouth daily.   Yes Historical Provider, MD  magnesium gluconate (MAGONATE) 500 MG tablet Take 1,000-1,500 mg by mouth daily.   Yes Historical Provider, MD  ondansetron (ZOFRAN ODT) 4 MG disintegrating tablet Take 1 tablet (4 mg total) by  mouth every 8 (eight) hours as needed for nausea or vomiting. 06/18/14  Yes Jennifer Lee H Presson, PA   BP 133/69  Pulse 95  Temp(Src) 100 F (37.8 C) (Oral)  Resp 16  SpO2 99% Physical Exam  Nursing note and vitals  reviewed. Constitutional: He appears well-developed and well-nourished. No distress.  Awake, alert, nontoxic appearance  HENT:  Head: Normocephalic and atraumatic.  Mouth/Throat: Oropharynx is clear and moist. No oropharyngeal exudate.  Eyes: Conjunctivae are normal. No scleral icterus.  Neck: Normal range of motion. Neck supple.  Cardiovascular: Normal rate, regular rhythm, normal heart sounds and intact distal pulses.   No murmur heard. Pulmonary/Chest: Effort normal and breath sounds normal. No respiratory distress. He has no wheezes.  Equal chest expansion  Abdominal: Soft. Bowel sounds are normal. He exhibits no distension and no mass. There is tenderness in the right lower quadrant, suprapubic area and left lower quadrant. There is guarding. There is no rebound and no CVA tenderness.  Exquisite abdominal pain in the lower abdomen with guarding; worse in the right lower quadrant  Musculoskeletal: Normal range of motion. He exhibits no edema.  Neurological: He is alert.  Speech is clear and goal oriented Moves extremities without ataxia  Skin: Skin is warm and dry. He is not diaphoretic.  Psychiatric: He has a normal mood and affect.    ED Course  Procedures (including critical care time) Labs Review Labs Reviewed  CBC WITH DIFFERENTIAL - Abnormal; Notable for the following:    WBC 12.6 (*)    Neutrophils Relative % 85 (*)    Neutro Abs 10.6 (*)    Lymphocytes Relative 5 (*)    Monocytes Absolute 1.2 (*)    All other components within normal limits  COMPREHENSIVE METABOLIC PANEL - Abnormal; Notable for the following:    Potassium 3.6 (*)    Anion gap 16 (*)    All other components within normal limits  URINALYSIS, ROUTINE W REFLEX MICROSCOPIC - Abnormal; Notable for the following:    Color, Urine ORANGE (*)    Specific Gravity, Urine 1.031 (*)    Bilirubin Urine SMALL (*)    Ketones, ur 15 (*)    Protein, ur 100 (*)    Leukocytes, UA TRACE (*)    All other components  within normal limits  LIPASE, BLOOD  URINE MICROSCOPIC-ADD ON  I-STAT CG4 LACTIC ACID, ED    Imaging Review Ct Abdomen Pelvis W Contrast  06/19/2014   CLINICAL DATA:  Low abdominal pain and fever for 4 days. Nausea and vomiting.  EXAM: CT ABDOMEN AND PELVIS WITH CONTRAST  TECHNIQUE: Multidetector CT imaging of the abdomen and pelvis was performed using the standard protocol following bolus administration of intravenous contrast.  CONTRAST:  OMNIPAQUE IOHEXOL 300 MG/ML  SOLN  COMPARISON:  None.  FINDINGS: The liver, spleen, gallbladder, pancreas, adrenal glands, kidneys, abdominal aorta, inferior vena cava, and retroperitoneal lymph nodes are unremarkable. Small accessory spleen. Air-fluid levels in the colon consistent with liquid stool. No colonic distention or wall thickening. Small bowel are mildly distended proximally with distal decompression. Wall thickening is suggested in distal small bowel. Changes could represent infectious enteritis or inflammatory enteritis such as Crohn's disease. No definite abscess. There is mild edema/stranding in the mesenteric. Moderately prominent mesenteric lymph nodes are likely reactive. No free air or free fluid in the abdomen.  Pelvis: Bladder wall is not thickened. No free or loculated pelvic fluid collections. Mild thickening of decompressed sigmoid colonic wall. No destructive  bone lesions.  IMPRESSION: Wall thickening in the distal small bowel with mesenteric edema and prominent mesenteric lymph nodes. Dilatation of proximal small bowel. Changes may represent infectious or inflammatory enteritis. Partial small bowel obstruction due to stricturing is not excluded. Sigmoid colon also appears thickened, also possibly infectious or inflammatory.   Electronically Signed   By: Burman Nieves M.D.   On: 06/19/2014 23:14     EKG Interpretation None      MDM   Final diagnoses:  Generalized abdominal pain  Nausea vomiting and diarrhea   Antonio Gariepy  presents with 3 days of nausea vomiting and diarrhea.  He has been unable to hold down any fluids. Patient was seen yesterday at urgent care given IV fluids and Zofran. At that time he was able to tolerate a small amount of water and was discharged home. He is now having worsening of his abdominal pain and persistent nausea and vomiting since his discharge from urgent care.  On exam he is exquisitely tender throughout, worst in the RLQ.  For possible ruptured appendicitis as patient has been febrile to 103 at home.  Patient with low-grade fever here in the emergency department.  Give fluid boluses, check labs and obtain CT scan.  9:45PM Patient with leukocytosis. Continues to be in exquisite pain. Fluid bolus will be repeated. CT abdomen pending.  12:10 AM CT scan with Dilatation of proximal small bowel. Changes may represent infectious or inflammatory enteritis. Partial small bowel obstruction due to stricturing is not excluded. Sigmoid colon also appears thickened.  Patient with family history of UC/Crohn's.  CT exam consistent with first Crohn's flare.   Patient discussed with Dr. Burman Nieves of radiology who reports that there is significant amount of terminal ileum swelling and therefore his appendix cannot be visualized.   Reason to be admitted to triad by Dr. Toniann Fail.  Consult placed to Dr. Harlon Flor as a trauma surgery who will evaluate the patient .    BP 133/69  Pulse 95  Temp(Src) 100 F (37.8 C) (Oral)  Resp 16  SpO2 99%       Dierdre Forth, PA-C 06/20/14 0012

## 2014-06-19 NOTE — ED Provider Notes (Signed)
CSN: [id number]     Arrival date & time 06/19/14  1621 History   First MD Initiated Contact with Patient 06/19/14 1632     Chief Complaint  Patient presents with  . Emesis  . Diarrhea  . Abdominal Pain   (Consider location/radiation/quality/duration/timing/severity/associated sxs/prior Treatment) HPI Comments: 23 year old male was seen in urgent care yesterday with abdominal pain GI complaints and diagnosed with gastroenteritis. He was given IV fluids and Zofran, was able to drink some liquids, felt better and was discharged home. Overnight and today he has had worsening of abdominal pain with vomiting and diarrhea approximately every 30 minutes. The abdominal pain is much worse today. It is worsened with lying flat or standing. It is better with sitting. Father reports that the patient was febrile last night with a temperature of 103. After a period of diaphoresis he defervesced.   Past Medical History  Diagnosis Date  . Allergy   . ADD (attention deficit disorder)    Past Surgical History  Procedure Laterality Date  . Knee surgery      recurrent spitz tumor   Family History  Problem Relation Age of Onset  . Cancer Mother     hx breast cancer   History  Substance Use Topics  . Smoking status: Never Smoker   . Smokeless tobacco: Not on file  . Alcohol Use: No    Review of Systems  Constitutional: Positive for fever, chills, diaphoresis and activity change.  HENT: Negative.   Respiratory: Negative for cough, choking and shortness of breath.   Cardiovascular: Negative for chest pain.  Gastrointestinal: Positive for nausea, vomiting, abdominal pain and diarrhea. Negative for abdominal distention.  Genitourinary: Negative.   Skin: Positive for pallor.  Neurological: Positive for tremors and light-headedness. Negative for syncope.    Allergies  Review of patient's allergies indicates no known allergies.  Home Medications   Prior to Admission medications   Medication  Sig Start Date End Date Taking? Authorizing Provider  ISOtretinoin (ACCUTANE) 40 MG capsule Take 40 mg by mouth daily.    Historical Provider, MD  ondansetron (ZOFRAN ODT) 4 MG disintegrating tablet Take 1 tablet (4 mg total) by mouth every 8 (eight) hours as needed for nausea or vomiting. 06/18/14   Mathis Fare Presson, PA   Temp(Src) 98.8 F (37.1 C) (Oral)  Resp 14  SpO2 100% Physical Exam  Nursing note and vitals reviewed. Constitutional: He is oriented to person, place, and time. He appears well-developed.  Sitting on the table as this is the most comfortable position for him. Speaking short responses, pale.  Eyes: Conjunctivae and EOM are normal.  Neck: Normal range of motion. Neck supple.  Cardiovascular: Normal rate and normal heart sounds.   Pulmonary/Chest: Effort normal and breath sounds normal.  Abdominal:  Abdomen rigid with severe pain with light touch. Lying on table exacerbates pain. No current vomiting while in exam room.  Neurological: He is alert and oriented to person, place, and time.  Skin: There is pallor.    ED Course  Procedures (including critical care time) Labs Review Labs Reviewed - No data to display  Imaging Review No results found.   MDM   1. Generalized abdominal pain   2. Diarrhea   3. Intractable vomiting with nausea, vomiting of unspecified type   4. Fever, unspecified fever cause     The plan as told to the pt and father was to start IV, zofran and transfer  Transfer to the ED. Father and pt declines  EMS and st father will take him to the ED now as directed. Possible surgical abdomen, consider appendicitis, ruptured viscous, abscess. Reported to Triage nurse at [facility] ED 1706.      Hayden Rasmussen, NP 06/19/14 [phone]

## 2014-06-19 NOTE — ED Notes (Signed)
Patient seen 9/6 for the sam, no improvement : pain, nausea, vomiting, and diarrhea continues.

## 2014-06-20 ENCOUNTER — Encounter (HOSPITAL_COMMUNITY): Payer: Self-pay | Admitting: *Deleted

## 2014-06-20 DIAGNOSIS — K56609 Unspecified intestinal obstruction, unspecified as to partial versus complete obstruction: Secondary | ICD-10-CM | POA: Diagnosis not present

## 2014-06-20 DIAGNOSIS — R112 Nausea with vomiting, unspecified: Secondary | ICD-10-CM | POA: Diagnosis present

## 2014-06-20 DIAGNOSIS — R809 Proteinuria, unspecified: Secondary | ICD-10-CM | POA: Diagnosis present

## 2014-06-20 DIAGNOSIS — E876 Hypokalemia: Secondary | ICD-10-CM | POA: Diagnosis present

## 2014-06-20 DIAGNOSIS — L509 Urticaria, unspecified: Secondary | ICD-10-CM | POA: Diagnosis not present

## 2014-06-20 DIAGNOSIS — R1084 Generalized abdominal pain: Secondary | ICD-10-CM | POA: Diagnosis present

## 2014-06-20 DIAGNOSIS — R109 Unspecified abdominal pain: Secondary | ICD-10-CM

## 2014-06-20 DIAGNOSIS — L708 Other acne: Secondary | ICD-10-CM | POA: Diagnosis present

## 2014-06-20 DIAGNOSIS — R197 Diarrhea, unspecified: Secondary | ICD-10-CM

## 2014-06-20 DIAGNOSIS — R933 Abnormal findings on diagnostic imaging of other parts of digestive tract: Secondary | ICD-10-CM

## 2014-06-20 DIAGNOSIS — A09 Infectious gastroenteritis and colitis, unspecified: Secondary | ICD-10-CM | POA: Diagnosis present

## 2014-06-20 HISTORY — DX: Nausea with vomiting, unspecified: R11.2

## 2014-06-20 LAB — BASIC METABOLIC PANEL
ANION GAP: 12 (ref 5–15)
BUN: 12 mg/dL (ref 6–23)
CHLORIDE: 100 meq/L (ref 96–112)
CO2: 25 mEq/L (ref 19–32)
Calcium: 8.3 mg/dL — ABNORMAL LOW (ref 8.4–10.5)
Creatinine, Ser: 0.89 mg/dL (ref 0.50–1.35)
Glucose, Bld: 105 mg/dL — ABNORMAL HIGH (ref 70–99)
POTASSIUM: 3.5 meq/L — AB (ref 3.7–5.3)
SODIUM: 137 meq/L (ref 137–147)

## 2014-06-20 LAB — POCT I-STAT, CHEM 8
BUN: 13 mg/dL (ref 6–23)
Calcium, Ion: 1.04 mmol/L — ABNORMAL LOW (ref 1.12–1.23)
Chloride: 103 mEq/L (ref 96–112)
Creatinine, Ser: 1 mg/dL (ref 0.50–1.35)
GLUCOSE: 129 mg/dL — AB (ref 70–99)
HEMATOCRIT: 45 % (ref 39.0–52.0)
HEMOGLOBIN: 15.3 g/dL (ref 13.0–17.0)
Potassium: 3.8 mEq/L (ref 3.7–5.3)
Sodium: 132 mEq/L — ABNORMAL LOW (ref 137–147)
TCO2: 22 mmol/L (ref 0–100)

## 2014-06-20 LAB — RAPID URINE DRUG SCREEN, HOSP PERFORMED
AMPHETAMINES: NOT DETECTED
Barbiturates: NOT DETECTED
Benzodiazepines: NOT DETECTED
Cocaine: NOT DETECTED
OPIATES: POSITIVE — AB
Tetrahydrocannabinol: NOT DETECTED

## 2014-06-20 LAB — GLUCOSE, CAPILLARY: Glucose-Capillary: 91 mg/dL (ref 70–99)

## 2014-06-20 LAB — LACTIC ACID, PLASMA: Lactic Acid, Venous: 1.1 mmol/L (ref 0.5–2.2)

## 2014-06-20 LAB — CLOSTRIDIUM DIFFICILE BY PCR: Toxigenic C. Difficile by PCR: NEGATIVE

## 2014-06-20 MED ORDER — ACETAMINOPHEN 325 MG PO TABS: 650.0000 mg | ORAL_TABLET | Freq: Four times a day (QID) | ORAL | Status: DC | PRN

## 2014-06-20 MED ORDER — CHLORHEXIDINE GLUCONATE 0.12 % MT SOLN
15.0000 mL | Freq: Two times a day (BID) | OROMUCOSAL | Status: DC
  Administered 2014-06-20 – 2014-06-21 (×2): 15 mL via OROMUCOSAL
  Filled 2014-06-20 (×5): qty 15

## 2014-06-20 MED ORDER — PROMETHAZINE HCL 25 MG/ML IJ SOLN
12.5000 mg | Freq: Once | INTRAMUSCULAR | Status: AC
  Administered 2014-06-20: 12.5 mg via INTRAVENOUS
  Filled 2014-06-20: qty 1

## 2014-06-20 MED ORDER — DEXTROSE-NACL 5-0.9 % IV SOLN
INTRAVENOUS | Status: AC
  Administered 2014-06-20 – 2014-06-21 (×3): via INTRAVENOUS

## 2014-06-20 MED ORDER — CIPROFLOXACIN IN D5W 400 MG/200ML IV SOLN
400.0000 mg | Freq: Two times a day (BID) | INTRAVENOUS | Status: DC
  Administered 2014-06-20 – 2014-06-22 (×7): 400 mg via INTRAVENOUS
  Filled 2014-06-20 (×9): qty 200

## 2014-06-20 MED ORDER — HYDROMORPHONE HCL PF 1 MG/ML IJ SOLN
1.0000 mg | INTRAMUSCULAR | Status: DC | PRN
  Administered 2014-06-20: 1 mg via INTRAVENOUS
  Filled 2014-06-20: qty 1

## 2014-06-20 MED ORDER — ACETAMINOPHEN 650 MG RE SUPP: 650.0000 mg | Freq: Four times a day (QID) | RECTAL | Status: DC | PRN

## 2014-06-20 MED ORDER — HYDROMORPHONE HCL PF 1 MG/ML IJ SOLN
1.0000 mg | INTRAMUSCULAR | Status: DC | PRN
  Administered 2014-06-20 – 2014-06-21 (×8): 1 mg via INTRAVENOUS
  Filled 2014-06-20 (×8): qty 1

## 2014-06-20 MED ORDER — SODIUM CHLORIDE 0.9 % IV SOLN
INTRAVENOUS | Status: DC
  Administered 2014-06-20: 02:00:00 via INTRAVENOUS

## 2014-06-20 MED ORDER — METRONIDAZOLE IN NACL 5-0.79 MG/ML-% IV SOLN
500.0000 mg | Freq: Three times a day (TID) | INTRAVENOUS | Status: DC
  Administered 2014-06-20 – 2014-06-23 (×10): 500 mg via INTRAVENOUS
  Filled 2014-06-20 (×12): qty 100

## 2014-06-20 MED ORDER — ONDANSETRON HCL 4 MG/2ML IJ SOLN
4.0000 mg | Freq: Four times a day (QID) | INTRAMUSCULAR | Status: DC | PRN
  Administered 2014-06-20 – 2014-06-25 (×9): 4 mg via INTRAVENOUS
  Filled 2014-06-20 (×10): qty 2

## 2014-06-20 MED ORDER — ENOXAPARIN SODIUM 40 MG/0.4ML ~~LOC~~ SOLN
40.0000 mg | SUBCUTANEOUS | Status: DC
  Administered 2014-06-20 – 2014-06-28 (×9): 40 mg via SUBCUTANEOUS
  Filled 2014-06-20 (×9): qty 0.4

## 2014-06-20 MED ORDER — ONDANSETRON HCL 4 MG/2ML IJ SOLN: 4.0000 mg | Freq: Three times a day (TID) | INTRAMUSCULAR | Status: DC | PRN

## 2014-06-20 MED ORDER — CETYLPYRIDINIUM CHLORIDE 0.05 % MT LIQD
7.0000 mL | Freq: Two times a day (BID) | OROMUCOSAL | Status: DC
  Administered 2014-06-20 – 2014-06-26 (×6): 7 mL via OROMUCOSAL

## 2014-06-20 MED ORDER — ONDANSETRON HCL 4 MG PO TABS
4.0000 mg | ORAL_TABLET | Freq: Four times a day (QID) | ORAL | Status: DC | PRN
  Administered 2014-06-23 (×2): 4 mg via ORAL
  Filled 2014-06-20 (×3): qty 1

## 2014-06-20 MED ORDER — SODIUM CHLORIDE 0.9 % IV SOLN
INTRAVENOUS | Status: DC
Start: 2014-06-20 — End: 2014-06-20

## 2014-06-20 NOTE — Progress Notes (Signed)
Notified Schorr, NP pt requesting nausea medication. Zofran is not due yet. NP put in order for phenergan IV x1. Will continue to monitor pt. Jerrye Bushy

## 2014-06-20 NOTE — Progress Notes (Signed)
Patient ID: Blake Bradley, male   DOB: 14-Jan-1991, 23 y.o.   MRN: [id number]  Feeling slightly better Having loose BM's  Will continue current care

## 2014-06-20 NOTE — H&P (Signed)
[facility] History and Physical  Benny Gregson BJY:[id number] DOB: 20-Mar-1991 DOA: 06/19/2014  Referring physician: ER physician. PCP: Nadean Corwin, MD   Chief Complaint: Abdominal pain with nausea vomiting and diarrhea.  HPI: Blake Bradley is a 23 y.o. male with history of Acne on Accutane for last 5 months presents with complaints of abdominal pain with nausea vomiting and diarrhea. Patient has been having these symptoms for last 3 days. Denies any blood in the vomitus or diarrhea. Denies any recent travel or sick contacts. Patient's pain is diffuse abdominal mostly in the lower quadrants. Colicky in nature. Patient also has been having subjective feeling of fever chills and at times diaphoresis. In the ER CT abdomen and pelvis was done which was showing -Wall thickening in the distal small bowel with mesenteric edema and prominent mesenteric lymph nodes. Dilatation of proximal small bowel. Changes may represent infectious or inflammatory enteritis. Partial small bowel obstruction due to stricturing is not excluded. Sigmoid colon also appears thickened, also possibly infectious or inflammatory. Patient on exam had diffuse tenderness of the abdomen. On-call surgeon Dr. Gwinda Passe was consulted by ED physician and will be seeing patient in consult. Patient has been on it for further workup. Patient denies any chest pain or shortness of breath.    Review of Systems: As presented in the history of presenting illness, rest negative.  Past Medical History  Diagnosis Date  . Allergy   . ADD (attention deficit disorder)    Past Surgical History  Procedure Laterality Date  . Knee surgery      recurrent spitz tumor   Social History:  reports that he has never smoked. He does not have any smokeless tobacco history on file. He reports that he does not drink alcohol or use illicit drugs. Where does patient live home. Can patient participate in ADLs? Yes.  Allergies  Allergen Reactions   . Prednisone Hives    Family History:  Family History  Problem Relation Age of Onset  . Cancer Mother     hx breast cancer  . Ulcerative colitis Mother       Prior to Admission medications   Medication Sig Start Date End Date Taking? Authorizing Provider  cetirizine (ZYRTEC) 10 MG tablet Take 10 mg by mouth daily.   Yes Historical Provider, MD  ibuprofen (ADVIL,MOTRIN) 200 MG tablet Take 200 mg by mouth every 6 (six) hours as needed (pain).   Yes Historical Provider, MD  ISOtretinoin (ACCUTANE) 40 MG capsule Take 40 mg by mouth daily.   Yes Historical Provider, MD  magnesium gluconate (MAGONATE) 500 MG tablet Take 1,000-1,500 mg by mouth daily.   Yes Historical Provider, MD  ondansetron (ZOFRAN ODT) 4 MG disintegrating tablet Take 1 tablet (4 mg total) by mouth every 8 (eight) hours as needed for nausea or vomiting. 06/18/14  Yes Ria Clock, PA    Physical Exam: Filed Vitals:   06/20/14 0000 06/20/14 0030 06/20/14 0100 06/20/14 0130  BP: 131/70 134/68 138/77 141/70  Pulse: 91 87 98 87  Temp:    99.4 F (37.4 C)  TempSrc:    Oral  Resp: Height:    6' 2.4" (1.89 m)  Weight:    73.71 kg (162 lb 8 oz)  SpO2: 97% 98% 99% 100%     General:  Well-developed and nourished.  Eyes: Anicteric no pallor.  ENT: No discharge from the ears eyes nose mouth.  Neck: No mass felt.  Cardiovascular: S1-S2 heard.  Respiratory:  No rhonchi or crepitations.  Abdomen: Diffuse tenderness of the abdomen. Bowel sounds present.  Skin: No rash.  Musculoskeletal: No edema.  Psychiatric: Appears normal.  Neurologic: Alert awake oriented to time place and person. Moves all extremities.  Labs on Admission:  Basic Metabolic Panel:  Recent Labs Lab 06/19/14 1721  NA 137  K 3.6*  CL 97  CO2 24  GLUCOSE 98  BUN 15  CREATININE 1.08  CALCIUM 9.3   Liver Function Tests:  Recent Labs Lab 06/19/14 1721  AST 26  ALT 21  ALKPHOS 93  BILITOT 1.1  PROT 7.8   ALBUMIN 3.5    Recent Labs Lab 06/19/14 1856  LIPASE 38   No results found for this basename: AMMONIA,  in the last 168 hours CBC:  Recent Labs Lab 06/19/14 1721  WBC 12.6*  NEUTROABS 10.6*  HGB 15.2  HCT 42.3  MCV 84.4  PLT 211   Cardiac Enzymes: No results found for this basename: CKTOTAL, CKMB, CKMBINDEX, TROPONINI,  in the last 168 hours  BNP (last 3 results) No results found for this basename: PROBNP,  in the last 8760 hours CBG:  Recent Labs Lab 06/20/14 0206  GLUCAP 91    Radiological Exams on Admission: Ct Abdomen Pelvis W Contrast  06/19/2014   CLINICAL DATA:  Low abdominal pain and fever for 4 days. Nausea and vomiting.  EXAM: CT ABDOMEN AND PELVIS WITH CONTRAST  TECHNIQUE: Multidetector CT imaging of the abdomen and pelvis was performed using the standard protocol following bolus administration of intravenous contrast.  CONTRAST:  OMNIPAQUE IOHEXOL 300 MG/ML  SOLN  COMPARISON:  None.  FINDINGS: The liver, spleen, gallbladder, pancreas, adrenal glands, kidneys, abdominal aorta, inferior vena cava, and retroperitoneal lymph nodes are unremarkable. Small accessory spleen. Air-fluid levels in the colon consistent with liquid stool. No colonic distention or wall thickening. Small bowel are mildly distended proximally with distal decompression. Wall thickening is suggested in distal small bowel. Changes could represent infectious enteritis or inflammatory enteritis such as Crohn's disease. No definite abscess. There is mild edema/stranding in the mesenteric. Moderately prominent mesenteric lymph nodes are likely reactive. No free air or free fluid in the abdomen.  Pelvis: Bladder wall is not thickened. No free or loculated pelvic fluid collections. Mild thickening of decompressed sigmoid colonic wall. No destructive bone lesions.  IMPRESSION: Wall thickening in the distal small bowel with mesenteric edema and prominent mesenteric lymph nodes. Dilatation of proximal  small bowel. Changes may represent infectious or inflammatory enteritis. Partial small bowel obstruction due to stricturing is not excluded. Sigmoid colon also appears thickened, also possibly infectious or inflammatory.   Electronically Signed   By: Burman Nieves M.D.   On: 06/19/2014 23:14    Assessment/Plan Principal Problem:   Abdominal pain Active Problems:   Nausea vomiting and diarrhea   1. Abdominal pain with nausea vomiting and diarrhea - concerning for inflammation versus infectious causes. Surgery to see patient in consult. Patient has been kept n.p.o. with pain medications and empiric antibiotics. Check stool studies including C. difficile and cultures. 2. History of acne on Accutane - presently n.p.o. 3. Urine shows mild proteinuria - will need followup as outpatient.    Code Status: Full code.  Family Communication: None.  Disposition Plan: Admit to inpatient.    Juleon Narang N. [facility] Pager [phone].  If 7PM-7AM, please contact night-coverage [email or web address] Password [email or web address] 06/20/2014, 2:48 AM

## 2014-06-20 NOTE — Progress Notes (Signed)
Utilization review completed.  

## 2014-06-20 NOTE — Progress Notes (Signed)
Received report from ED. Aaryan Essman Thacker, RN 

## 2014-06-20 NOTE — Progress Notes (Signed)
Blake Bradley is a 23 y.o. male with history of Acne on Accutane for last 5 months presents with complaints of abdominal pain with nausea vomiting and diarrhea. Ct revealing mesenteric edema and small bowel wall thickening. NeedS colonoscopy / sigmoidoscopy to evaluate for crohn's / UC.  He was admitted earlier this am and reports his symptoms have improved. Surgery and GI consulted . Currently on IV ciprofloxacin nd IV flagyl. Continue to monitor. Resume IV fluids.   Kathlen Mody, MD [phone]

## 2014-06-20 NOTE — Consult Note (Signed)
Reason for Consult:Lower abdominal pain; rule out appendicitis Referring Physician: Mohit Zirbes States is an 23 y.o. male.  HPI: Blake Bradley is a 23 y.o. male with a hx of allergic rhinitis presents to the Emergency Department complaining of gradual, persistent, progressively worsening lower abd pain onset 5 days ago. Associated symptoms include N/V/D everyday since then. Pt was seen at Urgent Care yesterday, given fluids, zofran and sent home. Patient reports is unable to tolerate any intake by mouth for several days. Patient denies urinary symptoms, penile discharge, testicular pain, penile pain. No history of abdominal surgeries. Family of patient reports fevers of 103 with diaphoresis at home. Patient reports that pain is better with sitting or curling up but worsened with lying flat or standing. Patient reports that the bumps in the car ride caused more pain.  No other aggravating or alleviating factors. Pt denies headache, neck pain, chest pain, shortness of breath, syncope, dysuria and hematuria. Patient reports emesis is nonbloody, nonbilious and diarrhea has been without melena or hematochezia.  No previous episodes  Past Medical History  Diagnosis Date  . Allergy   . ADD (attention deficit disorder)     Past Surgical History  Procedure Laterality Date  . Knee surgery      recurrent spitz tumor    Family History  Problem Relation Age of Onset  . Cancer Mother     hx breast cancer  + for inflammatory bowel disease - mother - managed by Dr. Earlean Shawl  Social History:  reports that he has never smoked. He does not have any smokeless tobacco history on file. He reports that he does not drink alcohol or use illicit drugs.  Allergies:  Allergies  Allergen Reactions  . Prednisone Hives    Medications:  Prior to Admission medications   Medication Sig Start Date End Date Taking? Authorizing Provider  cetirizine (ZYRTEC) 10 MG tablet Take 10 mg by mouth daily.   Yes  Historical Provider, MD  ibuprofen (ADVIL,MOTRIN) 200 MG tablet Take 200 mg by mouth every 6 (six) hours as needed (pain).   Yes Historical Provider, MD  ISOtretinoin (ACCUTANE) 40 MG capsule Take 40 mg by mouth daily.   Yes Historical Provider, MD  magnesium gluconate (MAGONATE) 500 MG tablet Take 1,000-1,500 mg by mouth daily.   Yes Historical Provider, MD  ondansetron (ZOFRAN ODT) 4 MG disintegrating tablet Take 1 tablet (4 mg total) by mouth every 8 (eight) hours as needed for nausea or vomiting. 06/18/14  Yes Lutricia Feil, PA     Results for orders placed during the hospital encounter of 06/19/14 (from the past 48 hour(s))  CBC WITH DIFFERENTIAL     Status: Abnormal   Collection Time    06/19/14  5:21 PM      Result Value Ref Range   WBC 12.6 (*) 4.0 - 10.5 K/uL   RBC 5.01  4.22 - 5.81 MIL/uL   Hemoglobin 15.2  13.0 - 17.0 g/dL   HCT 42.3  39.0 - 52.0 %   MCV 84.4  78.0 - 100.0 fL   MCH 30.3  26.0 - 34.0 pg   MCHC 35.9  30.0 - 36.0 g/dL   RDW 12.5  11.5 - 15.5 %   Platelets 211  150 - 400 K/uL   Neutrophils Relative % 85 (*) 43 - 77 %   Neutro Abs 10.6 (*) 1.7 - 7.7 K/uL   Lymphocytes Relative 5 (*) 12 - 46 %   Lymphs Abs 0.7  0.7 -  4.0 K/uL   Monocytes Relative 9  3 - 12 %   Monocytes Absolute 1.2 (*) 0.1 - 1.0 K/uL   Eosinophils Relative 1  0 - 5 %   Eosinophils Absolute 0.1  0.0 - 0.7 K/uL   Basophils Relative 0  0 - 1 %   Basophils Absolute 0.0  0.0 - 0.1 K/uL  COMPREHENSIVE METABOLIC PANEL     Status: Abnormal   Collection Time    06/19/14  5:21 PM      Result Value Ref Range   Sodium 137  137 - 147 mEq/L   Potassium 3.6 (*) 3.7 - 5.3 mEq/L   Chloride 97  96 - 112 mEq/L   CO2 24  19 - 32 mEq/L   Glucose, Bld 98  70 - 99 mg/dL   BUN 15  6 - 23 mg/dL   Creatinine, Ser 9.35  0.50 - 1.35 mg/dL   Calcium 9.3  8.4 - 76.6 mg/dL   Total Protein 7.8  6.0 - 8.3 g/dL   Albumin 3.5  3.5 - 5.2 g/dL   AST 26  0 - 37 U/L   ALT 21  0 - 53 U/L   Alkaline Phosphatase  93  39 - 117 U/L   Total Bilirubin 1.1  0.3 - 1.2 mg/dL   GFR calc non Af Amer >90  >90 mL/min   GFR calc Af Amer >90  >90 mL/min   Comment: (NOTE)     The eGFR has been calculated using the CKD EPI equation.     This calculation has not been validated in all clinical situations.     eGFR's persistently <90 mL/min signify possible Chronic Kidney     Disease.   Anion gap 16 (*) 5 - 15  LIPASE, BLOOD     Status: None   Collection Time    06/19/14  6:56 PM      Result Value Ref Range   Lipase 38  11 - 59 U/L  I-STAT CG4 LACTIC ACID, ED     Status: None   Collection Time    06/19/14  8:02 PM      Result Value Ref Range   Lactic Acid, Venous 1.24  0.5 - 2.2 mmol/L  URINALYSIS, ROUTINE W REFLEX MICROSCOPIC     Status: Abnormal   Collection Time    06/19/14  9:30 PM      Result Value Ref Range   Color, Urine ORANGE (*) YELLOW   Comment: BIOCHEMICALS MAY BE AFFECTED BY COLOR   APPearance CLEAR  CLEAR   Specific Gravity, Urine 1.031 (*) 1.005 - 1.030   pH 6.0  5.0 - 8.0   Glucose, UA NEGATIVE  NEGATIVE mg/dL   Hgb urine dipstick NEGATIVE  NEGATIVE   Bilirubin Urine SMALL (*) NEGATIVE   Ketones, ur 15 (*) NEGATIVE mg/dL   Protein, ur 526 (*) NEGATIVE mg/dL   Urobilinogen, UA 1.0  0.0 - 1.0 mg/dL   Nitrite NEGATIVE  NEGATIVE   Leukocytes, UA TRACE (*) NEGATIVE  URINE MICROSCOPIC-ADD ON     Status: None   Collection Time    06/19/14  9:30 PM      Result Value Ref Range   Squamous Epithelial / LPF RARE  RARE   WBC, UA 0-2  <3 WBC/hpf   Bacteria, UA RARE  RARE    Ct Abdomen Pelvis W Contrast  06/19/2014   CLINICAL DATA:  Low abdominal pain and fever for 4 days. Nausea and vomiting.  EXAM:  CT ABDOMEN AND PELVIS WITH CONTRAST  TECHNIQUE: Multidetector CT imaging of the abdomen and pelvis was performed using the standard protocol following bolus administration of intravenous contrast.  CONTRAST:  157m OMNIPAQUE IOHEXOL 300 MG/ML  SOLN  COMPARISON:  None.  FINDINGS: The liver, spleen,  gallbladder, pancreas, adrenal glands, kidneys, abdominal aorta, inferior vena cava, and retroperitoneal lymph nodes are unremarkable. Small accessory spleen. Air-fluid levels in the colon consistent with liquid stool. No colonic distention or wall thickening. Small bowel are mildly distended proximally with distal decompression. Wall thickening is suggested in distal small bowel. Changes could represent infectious enteritis or inflammatory enteritis such as Crohn's disease. No definite abscess. There is mild edema/stranding in the mesenteric. Moderately prominent mesenteric lymph nodes are likely reactive. No free air or free fluid in the abdomen.  Pelvis: Bladder wall is not thickened. No free or loculated pelvic fluid collections. Mild thickening of decompressed sigmoid colonic wall. No destructive bone lesions.  IMPRESSION: Wall thickening in the distal small bowel with mesenteric edema and prominent mesenteric lymph nodes. Dilatation of proximal small bowel. Changes may represent infectious or inflammatory enteritis. Partial small bowel obstruction due to stricturing is not excluded. Sigmoid colon also appears thickened, also possibly infectious or inflammatory.   Electronically Signed   By: WLucienne CapersM.D.   On: 06/19/2014 23:14    Review of Systems  Constitutional: Positive for fever and malaise/fatigue. Negative for weight loss.  HENT: Negative for ear discharge, ear pain, hearing loss and tinnitus.   Eyes: Negative for blurred vision, double vision, photophobia and pain.  Respiratory: Negative for cough, sputum production and shortness of breath.   Cardiovascular: Negative for chest pain.  Gastrointestinal: Positive for nausea, vomiting, abdominal pain and diarrhea.  Genitourinary: Negative for dysuria, urgency, frequency and flank pain.  Musculoskeletal: Negative for back pain, falls, joint pain, myalgias and neck pain.  Neurological: Negative for dizziness, tingling, sensory change,  focal weakness, loss of consciousness and headaches.  Endo/Heme/Allergies: Does not bruise/bleed easily.  Psychiatric/Behavioral: Negative for depression, memory loss and substance abuse. The patient is not nervous/anxious.    Blood pressure 133/69, pulse 95, temperature 100 F (37.8 C), temperature source Oral, resp. rate 16, SpO2 99.00%. Physical Exam WDWN in NAD HEENT:  EOMI, sclera anicteric Neck:  No masses, no thyromegaly Lungs:  CTA bilaterally; normal respiratory effort CV:  Regular rate and rhythm; no murmurs Abd:  Hyperactive bowel sounds, mildly distended; diffuse bilateral lower quadrant tenderness with some guarding; no masses Ext:  Well-perfused; no edema Skin:  Warm, dry; no sign of jaundice  Assessment/Plan: Radiologic findings, history, and physical examiantion more consistent with inflammatory bowel disease as opposed to acute appendicitis.  Would treat with broad-spectrum IV antibiotics and hydration.  Consider GI consult to evaluate for Crohns' disease or ulcerative colitis.  Keep him NPO for now until symptoms improve.  Will follow with you.  Azoria Abbett K. 06/20/2014, 12:44 AM

## 2014-06-20 NOTE — Progress Notes (Signed)
Attempted to call to get report. ED nurse stated he would call back to give report. Nelda Marseille, RN

## 2014-06-20 NOTE — ED Provider Notes (Signed)
Medical screening examination/treatment/procedure(s) were performed by a resident physician or non-physician practitioner and as the supervising physician I was immediately available for consultation/collaboration.  Bernell Sigal, MD    Mazelle Huebert S Dariela Stoker, MD 06/20/14 0813 

## 2014-06-20 NOTE — Progress Notes (Signed)
Pt admitted to 5w20 from ED. Pt lives at home with parents. Pt's right forearm is pink, where tape was removed in ED.  Pt oriented to unit and room. Will continue to monitor pt. Nelda Marseille, RN

## 2014-06-20 NOTE — Consult Note (Signed)
[facility] Consult: 11:42 AM 06/20/2014  LOS: 1 day    Referring Provider: Dr Blake Divine  Primary Care Physician:  Nadean Corwin, MD Primary Gastroenterologist:  unassigned    Reason for Consultation:  enteritis   HPI: Blake Bradley is a 23 y.o. male.  Takes Claravis  (Acutane) for acne (started 5 months ago). Hx ADD. 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 Student at [employer].   Admitted with 3 days of n/v/d and diffuse but predominantly lower abdominal pain. Fever to 103.  CT scan shows thickened distal SB with prominent mesenteric lymph nodes. Proximal SB is dilated. Unable to rule out PSBO. Sigmoid colon also looks thickened.    He had no GI prodrome.  Just developed pain on Friday AM, grre/brown watery diarrhea Friday PM and clear emesis started Saturday AM.  Up until then eating well.  Eats mostly takeout and fast food.  Well water.  No sick contacts  Mother has UC and takes Asacol, followed by Dr Kinnie Scales.     Past Medical History  Diagnosis Date  . Allergy   . ADD (attention deficit disorder)     Past Surgical History  Procedure Laterality Date  . Knee surgery      recurrent spitz tumor    Prior to Admission medications   Medication Sig Start Date End Date Taking? Authorizing Provider  cetirizine (ZYRTEC) 10 MG tablet Take 10 mg by mouth daily.   Yes Historical Provider, MD  ibuprofen (ADVIL,MOTRIN) 200 MG tablet Take 200 mg by mouth every 6 (six) hours as needed (pain).   Yes Historical Provider, MD  ISOtretinoin (ACCUTANE) 40 MG capsule Take 40 mg by mouth daily.   Yes Historical Provider, MD  magnesium gluconate (MAGONATE) 500 MG tablet Take 1,000-1,500 mg by mouth daily.   Yes Historical Provider, MD  ondansetron (ZOFRAN ODT) 4 MG disintegrating tablet Take 1 tablet  (4 mg total) by mouth every 8 (eight) hours as needed for nausea or vomiting. 06/18/14  Yes Ria Clock, PA    Scheduled Meds: . antiseptic oral rinse  7 mL Mouth Rinse q12n4p  . chlorhexidine  15 mL Mouth Rinse BID  . ciprofloxacin  400 mg Intravenous BID  . enoxaparin (LOVENOX) injection  40 mg Subcutaneous Q24H  . metronidazole  500 mg Intravenous 3 times per day   Infusions: . dextrose 5 % and 0.9% NaCl 100 mL/hr at 06/20/14 0307   PRN Meds: acetaminophen, acetaminophen, HYDROmorphone (DILAUDID) injection, ondansetron (ZOFRAN) IV, ondansetron   Allergies as of 06/19/2014 - Review Complete 06/19/2014  Allergen Reaction Noted  . Prednisone Hives 06/19/2014    Family History  Problem Relation Age of Onset  . Cancer Mother     hx breast cancer  . Ulcerative colitis Mother     History   Social History  . Marital Status: Single    Spouse Name: N/A    Number of Children: N/A  . Years of Education: N/A   Occupational History  . Not on file.   Social History Main Topics  .  Smoking status: Never Smoker   . Smokeless tobacco: Not on file  . Alcohol Use: No  . Drug Use: No  . Sexual Activity: Not on file   Other Topics Concern  . Not on file   Social History Narrative  . No narrative on file    REVIEW OF SYSTEMS: Constitutional:  No weakness, no weight loss ENT:  No nose bleeds Pulm:  No sob or cough CV:  No palpitations, no LE edema.  GU:  No hematuria, no frequency GI:  Per HPI.  Heme:  No unusual bleeding or bruising   Transfusions:  none Neuro:  No headaches, no peripheral tingling or numbness Derm:  No itching, no rash or sores. No tatoos Endocrine:  + sweats and chills.  No polyuria or dysuria Immunization:  Not queried.  Travel:  None beyond local counties in last few months.    PHYSICAL EXAM: Vital signs in last 24 hours: Filed Vitals:   06/20/14 0637  BP: 130/72  Pulse: 88  Temp: 98.4 F (36.9 C)  Resp: 18   Wt Readings from  Last 3 Encounters:  06/20/14 73.71 kg (162 lb 8 oz)  04/18/14 73.12 kg (161 lb 3.2 oz)  11/10/13 82.781 kg (182 lb 8 oz)    General: looks well, a bit pale.  comfortable Head:  No asymmetry or trauma  Eyes:  No icterus or pallor Ears:  Not HOH  Nose:  No congestion or discharge Mouth:  Clear and moist Neck:  No mass, no JVD. No bruits Lungs:  Clear bil.  Breathing not labored Heart: RRR.  No MRG Abdomen:  Soft, tender in lower abdomen.  No guard or rebound.  Active BS.  No distention. .   Rectal: deferred   Musc/Skeltl: no joint swelling or deformity Extremities:  No CCE  Neurologic:  Oriented x 3.  Good historian.  No tremor or limb weakness Skin:  No rash, no telangectasia Tattoos:  none Nodes:  No cervical or inguinal adenopathy   Psych:  Pleasant, cooperative, engaged, relaxed.   Intake/Output from previous day: 09/07 0701 - 09/08 0700 In: 806.3 [I.V.:506.3; IV Piggyback:300] Out: 300 [Urine:300] Intake/Output this shift:    LAB RESULTS:  Recent Labs  06/18/14 1347 06/19/14 1721  WBC  --  12.6*  HGB 15.3 15.2  HCT 45.0 42.3  PLT  --  211   BMET Lab Results  Component Value Date   NA 137 06/19/2014   NA 132* 06/18/2014   NA 142 01/30/2010   K 3.6* 06/19/2014   K 3.8 06/18/2014   K 4.0 01/30/2010   CL 97 06/19/2014   CL 103 06/18/2014   CL 108 01/30/2010   CO2 24 06/19/2014   CO2 28 01/30/2010   GLUCOSE 98 06/19/2014   GLUCOSE 129* 06/18/2014   GLUCOSE 74 01/30/2010   BUN 15 06/19/2014   BUN 13 06/18/2014   BUN 17 01/30/2010   CREATININE 1.08 06/19/2014   CREATININE 1.00 06/18/2014   CREATININE 1.14 01/30/2010   CALCIUM 9.3 06/19/2014   CALCIUM 9.2 01/30/2010   LFT  Recent Labs  06/19/14 1721  PROT 7.8  ALBUMIN 3.5  AST 26  ALT 21  ALKPHOS 93  BILITOT 1.1   PT/INR No results found for this basename: INR,  PROTIME   Hepatitis Panel No results found for this basename: HEPBSAG, HCVAB, HEPAIGM, HEPBIGM,  in the last 72 hours C-Diff No components found with this  basename: cdiff   Lipase     Component  Value Date/Time   LIPASE 38 06/19/2014 1856    Drugs of Abuse     Component Value Date/Time   LABOPIA POSITIVE* 06/19/2014 2130   LABOPIA NEGATIVE 01/29/2010 0644   COCAINSCRNUR NONE DETECTED 06/19/2014 2130   COCAINSCRNUR NEGATIVE 01/29/2010 0644   LABBENZ NONE DETECTED 06/19/2014 2130   LABBENZ NEGATIVE 01/29/2010 0644   AMPHETMU NONE DETECTED 06/19/2014 2130   AMPHETMU NEGATIVE 01/29/2010 0644   THCU NONE DETECTED 06/19/2014 2130   LABBARB NONE DETECTED 06/19/2014 2130     RADIOLOGY STUDIES: Ct Abdomen Pelvis W Contrast  06/19/2014   CLINICAL DATA:  Low abdominal pain and fever for 4 days. Nausea and vomiting.  EXAM: CT ABDOMEN AND PELVIS WITH CONTRAST  TECHNIQUE: Multidetector CT imaging of the abdomen and pelvis was performed using the standard protocol following bolus administration of intravenous contrast.  CONTRAST:  OMNIPAQUE IOHEXOL 300 MG/ML  SOLN  COMPARISON:  None.  FINDINGS: The liver, spleen, gallbladder, pancreas, adrenal glands, kidneys, abdominal aorta, inferior vena cava, and retroperitoneal lymph nodes are unremarkable. Small accessory spleen. Air-fluid levels in the colon consistent with liquid stool. No colonic distention or wall thickening. Small bowel are mildly distended proximally with distal decompression. Wall thickening is suggested in distal small bowel. Changes could represent infectious enteritis or inflammatory enteritis such as Crohn's disease. No definite abscess. There is mild edema/stranding in the mesenteric. Moderately prominent mesenteric lymph nodes are likely reactive. No free air or free fluid in the abdomen.  Pelvis: Bladder wall is not thickened. No free or loculated pelvic fluid collections. Mild thickening of decompressed sigmoid colonic wall. No destructive bone lesions.  IMPRESSION: Wall thickening in the distal small bowel with mesenteric edema and prominent mesenteric lymph nodes. Dilatation of proximal small bowel.  Changes may represent infectious or inflammatory enteritis. Partial small bowel obstruction due to stricturing is not excluded. Sigmoid colon also appears thickened, also possibly infectious or inflammatory.   Electronically Signed   By: Blake Bradley M.D.   On: 06/19/2014 23:14    ENDOSCOPIC STUDIES: none  IMPRESSION:   *  SB enteritis with possible PSBO.  Acute onset of sxs with fever weighs in favor of infectious etiology. However mother has UC and pt on isotretinoin which can cause IBD and sigmoid colon thickened per CT so need to rule out IBD/Crohn's.     PLAN:     *  Per Dr Marina Goodell. Jennye Moccasin  06/20/2014, 11:42 AM Pager: [phone]  GI ATTENDING  History, laboratories, x-rays reviewed. Patient personally seen and examined. Mother and father in a room at bedside. Agree with H&P as outlined above. Previously healthy 23 year old who developed acute nausea with vomiting and abdominal pain. Also fever. The clinical picture is most consistent with acute infectious gastroenteritis. Changes on x-ray can be seen with the same. Presentation is unusual for inflammatory bowel disease, though mother with IBD. Agree with holding dermatology medications indefinitely. In the interim, IV fluids, stool studies, and symptomatic therapies. We'll follow  Blake Bradley. Blake Keys., M.D. [facility] Division of Gastroenterology

## 2014-06-20 NOTE — ED Provider Notes (Signed)
Medical screening examination/treatment/procedure(s) were conducted as a shared visit with non-physician practitioner(s) and myself.  I personally evaluated the patient during the encounter.   EKG Interpretation None      Patient presents for evaluation of abdominal pain, nausea, vomiting, diarrhea. Patient has already been seen in urgent care and received IV fluids yesterday. Symptoms have recurred today. Examination reveals significant tenderness with guarding and borderline rebound in the right lower quadrant. CT scan did not visualize appendix. There is significant swelling of the terminal ileum. Patient does have a family history of IBD, I suspect this is first flare of Crohn's, but cannot rule out appendicitis. We'll admit to medicine, surgical consult to follow along to rule out appendicitis.  Gilda Crease, MD 06/20/14 [phone]

## 2014-06-21 LAB — CBC WITH DIFFERENTIAL/PLATELET
Basophils Absolute: 0 10*3/uL (ref 0.0–0.1)
Basophils Relative: 0 % (ref 0–1)
EOS PCT: 3 % (ref 0–5)
Eosinophils Absolute: 0.3 10*3/uL (ref 0.0–0.7)
HCT: 37.6 % — ABNORMAL LOW (ref 39.0–52.0)
Hemoglobin: 12.7 g/dL — ABNORMAL LOW (ref 13.0–17.0)
LYMPHS PCT: 8 % — AB (ref 12–46)
Lymphs Abs: 0.8 10*3/uL (ref 0.7–4.0)
MCH: 29.4 pg (ref 26.0–34.0)
MCHC: 33.8 g/dL (ref 30.0–36.0)
MCV: 87 fL (ref 78.0–100.0)
Monocytes Absolute: 1.3 10*3/uL — ABNORMAL HIGH (ref 0.1–1.0)
Monocytes Relative: 13 % — ABNORMAL HIGH (ref 3–12)
NEUTROS ABS: 8 10*3/uL — AB (ref 1.7–7.7)
Neutrophils Relative %: 76 % (ref 43–77)
PLATELETS: 197 10*3/uL (ref 150–400)
RBC: 4.32 MIL/uL (ref 4.22–5.81)
RDW: 12.7 % (ref 11.5–15.5)
WBC: 10.5 10*3/uL (ref 4.0–10.5)

## 2014-06-21 LAB — GI PATHOGEN PANEL BY PCR, STOOL
C difficile toxin A/B: NEGATIVE
CAMPYLOBACTER BY PCR: NEGATIVE
CRYPTOSPORIDIUM BY PCR: NEGATIVE
E COLI 0157 BY PCR: NEGATIVE
E coli (ETEC) LT/ST: NEGATIVE
E coli (STEC): NEGATIVE
G LAMBLIA BY PCR: NEGATIVE
Norovirus GI/GII: NEGATIVE
ROTAVIRUS A BY PCR: NEGATIVE
SALMONELLA BY PCR: NEGATIVE
Shigella by PCR: NEGATIVE

## 2014-06-21 LAB — RAPID URINE DRUG SCREEN, HOSP PERFORMED
AMPHETAMINES: NOT DETECTED
BARBITURATES: NOT DETECTED
Benzodiazepines: NOT DETECTED
Cocaine: NOT DETECTED
OPIATES: POSITIVE — AB
Tetrahydrocannabinol: NOT DETECTED

## 2014-06-21 MED ORDER — OXYCODONE HCL 5 MG PO TABS
5.0000 mg | ORAL_TABLET | ORAL | Status: DC | PRN
  Filled 2014-06-21: qty 1

## 2014-06-21 MED ORDER — HYDROMORPHONE HCL PF 1 MG/ML IJ SOLN
0.5000 mg | INTRAMUSCULAR | Status: DC | PRN
  Administered 2014-06-21 – 2014-06-22 (×6): 0.5 mg via INTRAVENOUS
  Filled 2014-06-21 (×6): qty 1

## 2014-06-21 MED ORDER — DIPHENHYDRAMINE-ZINC ACETATE 2-0.1 % EX CREA
TOPICAL_CREAM | Freq: Three times a day (TID) | CUTANEOUS | Status: DC | PRN
  Filled 2014-06-21 (×2): qty 28

## 2014-06-21 MED ORDER — POTASSIUM CHLORIDE CRYS ER 20 MEQ PO TBCR
40.0000 meq | EXTENDED_RELEASE_TABLET | Freq: Once | ORAL | Status: DC
  Filled 2014-06-21: qty 2

## 2014-06-21 NOTE — Progress Notes (Signed)
Patient ID: Blake Bradley, male   DOB: 1991/04/17, 23 y.o.   MRN: [id number]     [address] [place], [place] [address]    Phone: [phone] FAX: [phone]     Subjective: Afebrile.  VSS.  c diff negative.  Stool cultures pending.    Objective:  Vital signs:  Filed Vitals:   06/20/14 0637 06/20/14 1427 06/20/14 2142 06/21/14 0530  BP: 130/72 120/68 137/69 134/77  Pulse: 88 75 85 87  Temp: 98.4 F (36.9 C) 98.3 F (36.8 C) 99.5 F (37.5 C) 100 F (37.8 C)  TempSrc: Oral Oral Oral Oral  Resp: 18 18 18 18   Height:      Weight:      SpO2: 100% 99% 100% 97%    Last BM Date: 06/20/14  Intake/Output   Yesterday:  09/08 0701 - 09/09 0700 In: 2124.2 [I.V.:1824.2; IV Piggyback:300] Out: 600 [Urine:600] This shift:  Total I/O In: -  Out: 200 [Urine:200]   Physical Exam: General: Pt awake/alert/oriented x4 in no acute distress Abdomen: Soft.  Nondistended.  TTP lower abdomen without guarding.  No evidence of peritonitis.  No incarcerated hernias.   Problem List:   Principal Problem:   Abdominal pain Active Problems:   Nausea vomiting and diarrhea    Results:   Labs: Results for orders placed during the hospital encounter of 06/19/14 (from the past 48 hour(s))  CBC WITH DIFFERENTIAL     Status: Abnormal   Collection Time    06/19/14  5:21 PM      Result Value Ref Range   WBC 12.6 (*) 4.0 - 10.5 K/uL   RBC 5.01  4.22 - 5.81 MIL/uL   Hemoglobin 15.2  13.0 - 17.0 g/dL   HCT 42.3  39.0 - 52.0 %   MCV 84.4  78.0 - 100.0 fL   MCH 30.3  26.0 - 34.0 pg   MCHC 35.9  30.0 - 36.0 g/dL   RDW 12.5  11.5 - 15.5 %   Platelets 211  150 - 400 K/uL   Neutrophils Relative % 85 (*) 43 - 77 %   Neutro Abs 10.6 (*) 1.7 - 7.7 K/uL   Lymphocytes Relative 5 (*) 12 - 46 %   Lymphs Abs 0.7  0.7 - 4.0 K/uL   Monocytes Relative 9  3 - 12 %   Monocytes Absolute 1.2 (*) 0.1 - 1.0 K/uL   Eosinophils Relative 1   0 - 5 %   Eosinophils Absolute 0.1  0.0 - 0.7 K/uL   Basophils Relative 0  0 - 1 %   Basophils Absolute 0.0  0.0 - 0.1 K/uL  COMPREHENSIVE METABOLIC PANEL     Status: Abnormal   Collection Time    06/19/14  5:21 PM      Result Value Ref Range   Sodium 137  137 - 147 mEq/L   Potassium 3.6 (*) 3.7 - 5.3 mEq/L   Chloride 97  96 - 112 mEq/L   CO2 24  19 - 32 mEq/L   Glucose, Bld 98  70 - 99 mg/dL   BUN 15  6 - 23 mg/dL   Creatinine, Ser 1.08  0.50 - 1.35 mg/dL   Calcium 9.3  8.4 - 10.5 mg/dL   Total Protein 7.8  6.0 - 8.3 g/dL   Albumin 3.5  3.5 - 5.2 g/dL   AST 26  0 - 37 U/L   ALT  21  0 - 53 U/L   Alkaline Phosphatase 93  39 - 117 U/L   Total Bilirubin 1.1  0.3 - 1.2 mg/dL   GFR calc non Af Amer >90  >90 mL/min   GFR calc Af Amer >90  >90 mL/min   Comment: (NOTE)     The eGFR has been calculated using the CKD EPI equation.     This calculation has not been validated in all clinical situations.     eGFR's persistently <90 mL/min signify possible Chronic Kidney     Disease.   Anion gap 16 (*) 5 - 15  LIPASE, BLOOD     Status: None   Collection Time    06/19/14  6:56 PM      Result Value Ref Range   Lipase 38  11 - 59 U/L  I-STAT CG4 LACTIC ACID, ED     Status: None   Collection Time    06/19/14  8:02 PM      Result Value Ref Range   Lactic Acid, Venous 1.24  0.5 - 2.2 mmol/L  URINALYSIS, ROUTINE W REFLEX MICROSCOPIC     Status: Abnormal   Collection Time    06/19/14  9:30 PM      Result Value Ref Range   Color, Urine ORANGE (*) YELLOW   Comment: BIOCHEMICALS MAY BE AFFECTED BY COLOR   APPearance CLEAR  CLEAR   Specific Gravity, Urine 1.031 (*) 1.005 - 1.030   pH 6.0  5.0 - 8.0   Glucose, UA NEGATIVE  NEGATIVE mg/dL   Hgb urine dipstick NEGATIVE  NEGATIVE   Bilirubin Urine SMALL (*) NEGATIVE   Ketones, ur 15 (*) NEGATIVE mg/dL   Protein, ur 100 (*) NEGATIVE mg/dL   Urobilinogen, UA 1.0  0.0 - 1.0 mg/dL   Nitrite NEGATIVE  NEGATIVE   Leukocytes, UA TRACE (*) NEGATIVE   URINE MICROSCOPIC-ADD ON     Status: None   Collection Time    06/19/14  9:30 PM      Result Value Ref Range   Squamous Epithelial / LPF RARE  RARE   WBC, UA 0-2  <3 WBC/hpf   Bacteria, UA RARE  RARE  URINE RAPID DRUG SCREEN (HOSP PERFORMED)     Status: Abnormal   Collection Time    06/19/14  9:30 PM      Result Value Ref Range   Opiates POSITIVE (*) NONE DETECTED   Cocaine NONE DETECTED  NONE DETECTED   Benzodiazepines NONE DETECTED  NONE DETECTED   Amphetamines NONE DETECTED  NONE DETECTED   Tetrahydrocannabinol NONE DETECTED  NONE DETECTED   Barbiturates NONE DETECTED  NONE DETECTED   Comment:            DRUG SCREEN FOR MEDICAL PURPOSES     ONLY.  IF CONFIRMATION IS NEEDED     FOR ANY PURPOSE, NOTIFY LAB     WITHIN 5 DAYS.                LOWEST DETECTABLE LIMITS     FOR URINE DRUG SCREEN     Drug Class       Cutoff (ng/mL)     Amphetamine      1000     Barbiturate      200     Benzodiazepine   950     Tricyclics       932     Opiates          300  Cocaine          300     THC              50  GLUCOSE, CAPILLARY     Status: None   Collection Time    06/20/14  2:06 AM      Result Value Ref Range   Glucose-Capillary 91  70 - 99 mg/dL   Comment 1 Notify RN    CULTURE, BLOOD (ROUTINE X 2)     Status: None   Collection Time    06/20/14  5:15 AM      Result Value Ref Range   Specimen Description BLOOD RIGHT ARM     Special Requests BOTTLES DRAWN AEROBIC AND ANAEROBIC 8CC EA     Culture  Setup Time       Value: 06/20/2014 09:39     Performed at [facility]   Culture       Value:        BLOOD CULTURE RECEIVED NO GROWTH TO DATE CULTURE WILL BE HELD FOR 5 DAYS BEFORE ISSUING A FINAL NEGATIVE REPORT     Performed at [facility]   Report Status PENDING    LACTIC ACID, PLASMA     Status: None   Collection Time    06/20/14  5:15 AM      Result Value Ref Range   Lactic Acid, Venous 1.1  0.5 - 2.2 mmol/L  CULTURE, BLOOD (ROUTINE X 2)     Status: None    Collection Time    06/20/14  5:27 AM      Result Value Ref Range   Specimen Description BLOOD LEFT HAND     Special Requests BOTTLES DRAWN AEROBIC AND ANAEROBIC 5CC EA     Culture  Setup Time       Value: 06/20/2014 09:38     Performed at [facility]   Culture       Value:        BLOOD CULTURE RECEIVED NO GROWTH TO DATE CULTURE WILL BE HELD FOR 5 DAYS BEFORE ISSUING A FINAL NEGATIVE REPORT     Performed at [facility]   Report Status PENDING    CLOSTRIDIUM DIFFICILE BY PCR     Status: None   Collection Time    06/20/14 10:53 AM      Result Value Ref Range   C difficile by pcr NEGATIVE  NEGATIVE  BASIC METABOLIC PANEL     Status: Abnormal   Collection Time    06/20/14  1:08 PM      Result Value Ref Range   Sodium 137  137 - 147 mEq/L   Potassium 3.5 (*) 3.7 - 5.3 mEq/L   Chloride 100  96 - 112 mEq/L   CO2 25  19 - 32 mEq/L   Glucose, Bld 105 (*) 70 - 99 mg/dL   BUN 12  6 - 23 mg/dL   Creatinine, Ser 0.89  0.50 - 1.35 mg/dL   Calcium 8.3 (*) 8.4 - 10.5 mg/dL   GFR calc non Af Amer >90  >90 mL/min   GFR calc Af Amer >90  >90 mL/min   Comment: (NOTE)     The eGFR has been calculated using the CKD EPI equation.     This calculation has not been validated in all clinical situations.     eGFR's persistently <90 mL/min signify possible Chronic Kidney     Disease.   Anion gap 12  5 -  15    Imaging / Studies: Ct Abdomen Pelvis W Contrast  06/19/2014   CLINICAL DATA:  Low abdominal pain and fever for 4 days. Nausea and vomiting.  EXAM: CT ABDOMEN AND PELVIS WITH CONTRAST  TECHNIQUE: Multidetector CT imaging of the abdomen and pelvis was performed using the standard protocol following bolus administration of intravenous contrast.  CONTRAST:  161m OMNIPAQUE IOHEXOL 300 MG/ML  SOLN  COMPARISON:  None.  FINDINGS: The liver, spleen, gallbladder, pancreas, adrenal glands, kidneys, abdominal aorta, inferior vena cava, and retroperitoneal lymph nodes are unremarkable. Small  accessory spleen. Air-fluid levels in the colon consistent with liquid stool. No colonic distention or wall thickening. Small bowel are mildly distended proximally with distal decompression. Wall thickening is suggested in distal small bowel. Changes could represent infectious enteritis or inflammatory enteritis such as Crohn's disease. No definite abscess. There is mild edema/stranding in the mesenteric. Moderately prominent mesenteric lymph nodes are likely reactive. No free air or free fluid in the abdomen.  Pelvis: Bladder wall is not thickened. No free or loculated pelvic fluid collections. Mild thickening of decompressed sigmoid colonic wall. No destructive bone lesions.  IMPRESSION: Wall thickening in the distal small bowel with mesenteric edema and prominent mesenteric lymph nodes. Dilatation of proximal small bowel. Changes may represent infectious or inflammatory enteritis. Partial small bowel obstruction due to stricturing is not excluded. Sigmoid colon also appears thickened, also possibly infectious or inflammatory.   Electronically Signed   By: WLucienne CapersM.D.   On: 06/19/2014 23:14    Medications / Allergies:  Scheduled Meds: . antiseptic oral rinse  7 mL Mouth Rinse q12n4p  . chlorhexidine  15 mL Mouth Rinse BID  . ciprofloxacin  400 mg Intravenous BID  . enoxaparin (LOVENOX) injection  40 mg Subcutaneous Q24H  . metronidazole  500 mg Intravenous 3 times per day   Continuous Infusions:  PRN Meds:.acetaminophen, acetaminophen, HYDROmorphone (DILAUDID) injection, ondansetron (ZOFRAN) IV, ondansetron  Antibiotics: Anti-infectives   Start     Dose/Rate Route Frequency Ordered Stop   06/20/14 0400  metroNIDAZOLE (FLAGYL) IVPB 500 mg     500 mg 100 mL/hr over 60 Minutes Intravenous 3 times per day 06/20/14 0248     06/20/14 0330  ciprofloxacin (CIPRO) IVPB 400 mg     400 mg 200 mL/hr over 60 Minutes Intravenous 2 times daily 06/20/14 0249         Assessment/Plan Enteritis-low suspicion for appendicitis.  Will check a CBC to ensure white count is trending down. Non-surgical abdomen.  Further management per primary team and GI.    EErby Pian ACasper [facility] Pager [phone] For consults and floor pages call [phone]([id number])  06/21/2014 9:43 AM

## 2014-06-21 NOTE — Progress Notes (Signed)
PROGRESS NOTE  Blake Bradley WUJ:[id number] DOB: 22-Oct-1990 DOA: 06/19/2014 PCP: Nadean Corwin, MD  Assessment/Plan:  Abdominal pain / vomiting / diarrhea Suspected infectious etiology.  Stool cultures (GI pathogen panel) pending. CT scan shows Small Bowel and Colon thickening with PSBO. Appreciate GI consultation.  GI recommends ruling out IBD as patient is on Accutane and mother has UC. Continue on cipro/flagyl IV. Tolerating clears (but he has diaphoresis when he drinks them).  Will remain on clears today. Patient continues to request IV pain medication q 2 hours.  Will attempt to decrease dose and add Oxy IR (patient is aware). CCS has signed off.  Accutane use. Currently on hold.  Proteinuria Greater than 300 on admit u/a-However repeat UA proteinuria decreased to 100. Could be from acute febrile illness Creatinine within normal limits. Stable for outpatient work up-suspect will need to repeat UA in the outpatient setting once recovers from acute illness  Diffuse skin rash  Over trunk.  Not pruritic. Per patient the rash started Thursday night with N/V/ AP. Will monitor.   Hypokalemia  Mild Will supplement orally.   DVT Prophylaxis:  Enoxaparin  Code Status: full Family Communication: patient is alert and orientated. Disposition Plan: to home when able.   Consultants:  GI   CCS.  Procedures:    Antibiotics: Anti-infectives   Start     Dose/Rate Route Frequency Ordered Stop   06/20/14 0400  metroNIDAZOLE (FLAGYL) IVPB 500 mg     500 mg 100 mL/hr over 60 Minutes Intravenous 3 times per day 06/20/14 0248     06/20/14 0330  ciprofloxacin (CIPRO) IVPB 400 mg     400 mg 200 mL/hr over 60 Minutes Intravenous 2 times daily 06/20/14 0249          HPI/Subjective: Stools have decreased.  Tolerating clears with diaphoresis.  Reports nausea with sitting up.  Reports severe pain with even light palpation of his abdomen.    Objective: Filed  Vitals:   06/20/14 0637 06/20/14 1427 06/20/14 2142 06/21/14 0530  BP: 130/72 120/68 137/69 134/77  Pulse: 88 75 85 87  Temp: 98.4 F (36.9 C) 98.3 F (36.8 C) 99.5 F (37.5 C) 100 F (37.8 C)  TempSrc: Oral Oral Oral Oral  Resp: Height:      Weight:      SpO2: 100% 99% 100% 97%    Intake/Output Summary (Last 24 hours) at 06/21/14 1404 Last data filed at 06/21/14 0900  Gross per 24 hour  Intake 2124.17 ml  Output   1175 ml  Net 949.17 ml   Filed Weights   06/20/14 0130  Weight: 73.71 kg (162 lb 8 oz)    Exam: General: Well developed, well nourished, NAD, appears stated age  HEENT:  PERR, EOMI, Anicteic Sclera, MMM. No pharyngeal erythema or exudates  Neck: Supple, no JVD, no masses  Cardiovascular: RRR, S1 S2 auscultated, no rubs, murmurs or gallops.   Respiratory: Clear to auscultation bilaterally with equal chest rise  Abdomen: Soft, disproportionately tender, most tender in the right lower quadrant, + bowel sounds  Extremities: warm dry without cyanosis clubbing or edema.  Neuro: AAOx3, cranial nerves grossly intact. Strength 5/5 in upper and lower extremities  Skin: diffuse erythema over trunk, also patch of raised papules on right arm (approximately 3 cm x 3 cm) Psych: Normal affect and demeanor with intact judgement and insight   ata Reviewed: Basic Metabolic Panel:  Recent Labs Lab 06/18/14 1347 06/19/14 1721 06/20/14 1308  NA 132* 137 137  K 3.8 3.6* 3.5*  CL 103 97 100  CO2  --  24 25  GLUCOSE 129* 98 105*  BUN CREATININE 1.00 1.08 0.89  CALCIUM  --  9.3 8.3*   Liver Function Tests:  Recent Labs Lab 06/19/14 1721  AST 26  ALT 21  ALKPHOS 93  BILITOT 1.1  PROT 7.8  ALBUMIN 3.5    Recent Labs Lab 06/19/14 1856  LIPASE 38   CBC:  Recent Labs Lab 06/18/14 1347 06/19/14 1721 06/21/14 1156  WBC  --  12.6* 10.5  NEUTROABS  --  10.6* 8.0*  HGB 15.3 15.2 12.7*  HCT 45.0 42.3 37.6*  MCV  --  84.4 87.0  PLT   --  211 197  CBG:  Recent Labs Lab 06/20/14 0206  GLUCAP 91    Recent Results (from the past 240 hour(s))  CULTURE, BLOOD (ROUTINE X 2)     Status: None   Collection Time    06/20/14  5:15 AM      Result Value Ref Range Status   Specimen Description BLOOD RIGHT ARM   Final   Special Requests BOTTLES DRAWN AEROBIC AND ANAEROBIC 8CC EA   Final   Culture  Setup Time     Final   Value: 06/20/2014 09:39     Performed at [facility]   Culture     Final   Value:        BLOOD CULTURE RECEIVED NO GROWTH TO DATE CULTURE WILL BE HELD FOR 5 DAYS BEFORE ISSUING A FINAL NEGATIVE REPORT     Performed at [facility]   Report Status PENDING   Incomplete  CULTURE, BLOOD (ROUTINE X 2)     Status: None   Collection Time    06/20/14  5:27 AM      Result Value Ref Range Status   Specimen Description BLOOD LEFT HAND   Final   Special Requests BOTTLES DRAWN AEROBIC AND ANAEROBIC 5CC EA   Final   Culture  Setup Time     Final   Value: 06/20/2014 09:38     Performed at [facility]   Culture     Final   Value:        BLOOD CULTURE RECEIVED NO GROWTH TO DATE CULTURE WILL BE HELD FOR 5 DAYS BEFORE ISSUING A FINAL NEGATIVE REPORT     Performed at [facility]   Report Status PENDING   Incomplete  CLOSTRIDIUM DIFFICILE BY PCR     Status: None   Collection Time    06/20/14 10:53 AM      Result Value Ref Range Status   C difficile by pcr NEGATIVE  NEGATIVE Final     Studies: Ct Abdomen Pelvis W Contrast  06/19/2014   CLINICAL DATA:  Low abdominal pain and fever for 4 days. Nausea and vomiting.  EXAM: CT ABDOMEN AND PELVIS WITH CONTRAST  TECHNIQUE: Multidetector CT imaging of the abdomen and pelvis was performed using the standard protocol following bolus administration of intravenous contrast.  CONTRAST:  OMNIPAQUE IOHEXOL 300 MG/ML  SOLN  COMPARISON:  None.  FINDINGS: The liver, spleen, gallbladder, pancreas, adrenal glands, kidneys, abdominal aorta, inferior vena  cava, and retroperitoneal lymph nodes are unremarkable. Small accessory spleen. Air-fluid levels in the colon consistent with liquid stool. No colonic distention or wall thickening. Small bowel are mildly distended proximally with distal decompression. Wall thickening is suggested in distal small bowel.  Changes could represent infectious enteritis or inflammatory enteritis such as Crohn's disease. No definite abscess. There is mild edema/stranding in the mesenteric. Moderately prominent mesenteric lymph nodes are likely reactive. No free air or free fluid in the abdomen.  Pelvis: Bladder wall is not thickened. No free or loculated pelvic fluid collections. Mild thickening of decompressed sigmoid colonic wall. No destructive bone lesions.  IMPRESSION: Wall thickening in the distal small bowel with mesenteric edema and prominent mesenteric lymph nodes. Dilatation of proximal small bowel. Changes may represent infectious or inflammatory enteritis. Partial small bowel obstruction due to stricturing is not excluded. Sigmoid colon also appears thickened, also possibly infectious or inflammatory.   Electronically Signed   By: Burman Nieves M.D.   On: 06/19/2014 23:14    Scheduled Meds: . antiseptic oral rinse  7 mL Mouth Rinse q12n4p  . ciprofloxacin  400 mg Intravenous BID  . enoxaparin (LOVENOX) injection  40 mg Subcutaneous Q24H  . metronidazole  500 mg Intravenous 3 times per day   Continuous Infusions:   Principal Problem:   Abdominal pain Active Problems:   Nausea vomiting and diarrhea    Conley Canal  [facility] Pager [phone]. If 7PM-7AM, please contact night-coverage at [email or web address], password [facility] 06/21/2014, 2:04 PM  LOS: 2 days    Attending Patient was seen, examined,treatment plan was discussed with the Physician extender. I have directly reviewed the clinical findings, lab, imaging studies and management of this patient in detail. I have made the necessary changes  to the above noted documentation, and agree with the documentation, as recorded by the Physician extender.  Windell Norfolk MD [facility].

## 2014-06-21 NOTE — Progress Notes (Signed)
I have seen and examined the patient and agree with the assessment and plans. Till with moderate abdominal pain and tenderness.  Suspected infectious and not inflammatory. If he does not improve significantly in next 24 to 48 hours, would repeat his CT scan. Currently no plans for surgery.  Will sign off.  Call us again if we can help.  Wayne Brunker A. Magnus Ivan  MD, FACS

## 2014-06-21 NOTE — Progress Notes (Signed)
Daily Rounding Note  06/21/2014, 10:21 AM  LOS: 2 days   SUBJECTIVE:       Only 2 green watery BMs yesterday.  Still with pain in abdomen and nausea.  No emesis.  No heaving.  Some sweats  OBJECTIVE:         Vital signs in last 24 hours:    Temp:  [98.3 F (36.8 C)-100 F (37.8 C)] 100 F (37.8 C) (09/09 0530) Pulse Rate:  [75-87] 87 (09/09 0530) Resp:  [18] 18 (09/09 0530) BP: (120-137)/(68-77) 134/77 mmHg (09/09 0530) SpO2:  [97 %-100 %] 97 % (09/09 0530) Last BM Date: 06/20/14 General: looks well, just pale   Heart: RRR Chest: clear bil.  Abdomen: soft, BS hypoactive.  ND.  Tender diffusely, no guard or rebound.  Extremities: no CCE Neuro/Psych:  Pleasant, alert, no weakness or deficits.   Intake/Output from previous day: 09/08 0701 - 09/09 0700 In: 2124.2 [I.V.:1824.2; IV Piggyback:300] Out: 600 [Urine:600]  Intake/Output this shift: Total I/O In: -  Out: 200 [Urine:200]  Lab Results:  Recent Labs  06/18/14 1347 06/19/14 1721  WBC  --  12.6*  HGB 15.3 15.2  HCT 45.0 42.3  PLT  --  211   BMET  Recent Labs  06/18/14 1347 06/19/14 1721 06/20/14 1308  NA 132* 137 137  K 3.8 3.6* 3.5*  CL 103 97 100  CO2  --  24 25  GLUCOSE 129* 98 105*  BUN CREATININE 1.00 1.08 0.89  CALCIUM  --  9.3 8.3*    Studies/Results: Ct Abdomen Pelvis W Contrast 06/19/2014   COMPARISON:  None.  FINDINGS: The liver, spleen, gallbladder, pancreas, adrenal glands, kidneys, abdominal aorta, inferior vena cava, and retroperitoneal lymph nodes are unremarkable. Small accessory spleen. Air-fluid levels in the colon consistent with liquid stool. No colonic distention or wall thickening. Small bowel are mildly distended proximally with distal decompression. Wall thickening is suggested in distal small bowel. Changes could represent infectious enteritis or inflammatory enteritis such as Crohn's disease. No definite  abscess. There is mild edema/stranding in the mesenteric. Moderately prominent mesenteric lymph nodes are likely reactive. No free air or free fluid in the abdomen.  Pelvis: Bladder wall is not thickened. No free or loculated pelvic fluid collections. Mild thickening of decompressed sigmoid colonic wall. No destructive bone lesions.  IMPRESSION: Wall thickening in the distal small bowel with mesenteric edema and prominent mesenteric lymph nodes. Dilatation of proximal small bowel. Changes may represent infectious or inflammatory enteritis. Partial small bowel obstruction due to stricturing is not excluded. Sigmoid colon also appears thickened, also possibly infectious or inflammatory.   Electronically Signed   By: Burman Nieves M.D.   On: 06/19/2014 23:14   Scheduled Meds: . antiseptic oral rinse  7 mL Mouth Rinse q12n4p  . chlorhexidine  15 mL Mouth Rinse BID  . ciprofloxacin  400 mg Intravenous BID  . enoxaparin (LOVENOX) injection  40 mg Subcutaneous Q24H  . metronidazole  500 mg Intravenous 3 times per day   Continuous Infusions:  PRN Meds:.acetaminophen, acetaminophen, diphenhydrAMINE-zinc acetate, HYDROmorphone (DILAUDID) injection, ondansetron (ZOFRAN) IV, ondansetron   ASSESMENT:   * SB enteritis with possible PSBO. Acute onset of sxs with fever weighs in favor of infectious etiology.  However mother has UC and pt on isotretinoin which can cause IBD and sigmoid colon thickened per CT so need to rule out IBD/Crohn's.  The isotretinoin has been discontinued.  Day  2 IV cipro and IV flagyl.  Still with pain, nausea but markedly improved diarrhea.  Stool pathogen panel and culture study still in process.  Cdiff PCR is negative. *  Hypokalemia.  *  Contact dermatitis.  Small patch on right forarm.    PLAN   *  Continue empiric abx. *  Added topical Benadryl. *  PMD to address supplementing potassium. .  *  Advance diet as tolerated.     Blake Bradley  06/21/2014, 10:21 AM Pager:  [phone]  GI ATTENDING  Interval history data reviewed. Overall patient doing better. Having GI upset with oral potassium. No bowel movements today. Continue with hydration. Stool studies pending except for C. difficile which is negative. Encourage ambulation.  [name]. Eda Keys., M.D. [facility] Division of Gastroenterology

## 2014-06-22 LAB — BASIC METABOLIC PANEL
ANION GAP: 14 (ref 5–15)
Anion gap: 15 (ref 5–15)
BUN: 10 mg/dL (ref 6–23)
BUN: 10 mg/dL (ref 6–23)
CALCIUM: 8.5 mg/dL (ref 8.4–10.5)
CHLORIDE: 95 meq/L — AB (ref 96–112)
CO2: 26 mEq/L (ref 19–32)
CO2: 27 mEq/L (ref 19–32)
Calcium: 8.4 mg/dL (ref 8.4–10.5)
Chloride: 97 mEq/L (ref 96–112)
Creatinine, Ser: 0.81 mg/dL (ref 0.50–1.35)
Creatinine, Ser: 0.78 mg/dL (ref 0.50–1.35)
GFR calc Af Amer: >90 mL/min (ref >90)
GFR calc Af Amer: >90 mL/min (ref >90)
GFR calc non Af Amer: >90 mL/min (ref >90)
GFR calc non Af Amer: >90 mL/min (ref >90)
Glucose, Bld: 92 mg/dL (ref 70–99)
Glucose, Bld: 89 mg/dL (ref 70–99)
POTASSIUM: 3.5 meq/L — AB (ref 3.7–5.3)
Potassium: 3.7 mEq/L (ref 3.7–5.3)
SODIUM: 136 meq/L — AB (ref 137–147)
Sodium: 138 mEq/L (ref 137–147)

## 2014-06-22 LAB — CBC
HEMATOCRIT: 39.4 % (ref 39.0–52.0)
Hemoglobin: 13.5 g/dL (ref 13.0–17.0)
MCH: 29.7 pg (ref 26.0–34.0)
MCHC: 34.3 g/dL (ref 30.0–36.0)
MCV: 86.8 fL (ref 78.0–100.0)
Platelets: 220 10*3/uL (ref 150–400)
RBC: 4.54 MIL/uL (ref 4.22–5.81)
RDW: 12.9 % (ref 11.5–15.5)
WBC: 11.8 10*3/uL — ABNORMAL HIGH (ref 4.0–10.5)

## 2014-06-22 MED ORDER — DICYCLOMINE HCL 10 MG PO CAPS
10.0000 mg | ORAL_CAPSULE | Freq: Three times a day (TID) | ORAL | Status: DC
  Administered 2014-06-22 – 2014-06-23 (×6): 10 mg via ORAL
  Filled 2014-06-22 (×8): qty 1

## 2014-06-22 MED ORDER — KCL IN DEXTROSE-NACL 10-5-0.45 MEQ/L-%-% IV SOLN
INTRAVENOUS | Status: DC
  Administered 2014-06-22: 1000 mL via INTRAVENOUS
  Administered 2014-06-23 – 2014-06-28 (×12): via INTRAVENOUS
  Filled 2014-06-22 (×18): qty 1000

## 2014-06-22 MED ORDER — OXYCODONE HCL 5 MG PO TABS
5.0000 mg | ORAL_TABLET | ORAL | Status: DC | PRN
  Administered 2014-06-22 – 2014-06-23 (×3): 10 mg via ORAL
  Filled 2014-06-22 (×4): qty 2

## 2014-06-22 MED ORDER — INFLUENZA VAC SPLIT QUAD 0.5 ML IM SUSY
0.5000 mL | PREFILLED_SYRINGE | INTRAMUSCULAR | Status: DC
  Filled 2014-06-22: qty 0.5

## 2014-06-22 MED ORDER — HYDROMORPHONE HCL PF 1 MG/ML IJ SOLN: 0.5000 mg | INTRAMUSCULAR | Status: DC | PRN

## 2014-06-22 NOTE — Progress Notes (Signed)
          Daily Rounding Note  06/22/2014, 10:42 AM  LOS: 3 days   SUBJECTIVE:       Just one BM yesterday, soft/unformed and no blood.  Still with intermittent diffuse abdominal pain.  Seems worse if he moves around, tries to sit up.  Some nausea yesterday, none today.  No emesis.  Overall feels better.  No sweats  OBJECTIVE:         Vital signs in last 24 hours:    Temp:  [98 F (36.7 C)-99.2 F (37.3 C)] 98 F (36.7 C) (09/10 0552) Pulse Rate:  [72-81] 75 (09/10 0552) Resp:  [18-20] 20 (09/10 0552) BP: (131-136)/(69-74) 133/69 mmHg (09/10 0552) SpO2:  [97 %-99 %] 97 % (09/10 0552) Last BM Date: 06/21/14 General: looks comfortable, pale, not ill just tired   Heart: rrr Chest: clear bil Abdomen: soft, BS somewhat tinkling.  Not distended.   Extremities: no CCE Neuro/Psych:  Oriented x 3.  No limb weakness.  Affect subdued.   Intake/Output from previous day: 09/09 0701 - 09/10 0700 In: 1040 [P.O.:240; IV Piggyback:800] Out: 1575 [Urine:1575]  Intake/Output this shift: Total I/O In: -  Out: 300 [Urine:300]  Lab Results:  Recent Labs  06/19/14 1721 06/21/14 1156 06/22/14 0803  WBC 12.6* 10.5 11.8*  HGB 15.2 12.7* 13.5  HCT 42.3 37.6* 39.4  PLT 211 197 220   BMET  Recent Labs  06/19/14 1721 06/20/14 1308 06/22/14 0803  NA 137 137 136*  K 3.6* 3.5* 3.7  CL 97 100 95*  CO2 GLUCOSE 98 105* 92  BUN CREATININE 1.08 0.89 0.81  CALCIUM 9.3 8.3* 8.5   Scheduled Meds: . antiseptic oral rinse  7 mL Mouth Rinse q12n4p  . ciprofloxacin  400 mg Intravenous BID  . enoxaparin (LOVENOX) injection  40 mg Subcutaneous Q24H  . metronidazole  500 mg Intravenous 3 times per day  . potassium chloride  40 mEq Oral Once   Continuous Infusions:  PRN Meds:.acetaminophen, acetaminophen, diphenhydrAMINE-zinc acetate, HYDROmorphone (DILAUDID) injection, ondansetron (ZOFRAN) IV, ondansetron,  oxyCODONE  ASSESMENT:   * SB enteritis with possible PSBO. Acute onset of sxs with fever weighs in favor of infectious etiology.  However mother has UC and pt on isotretinoin which can cause IBD and sigmoid colon thickened per CT so need to rule out IBD/Crohn's.  The isotretinoin has been discontinued. Surgery consulted 9/9: favor dx of infectious over inflammatory etiology.  Day 3 IV cipro and IV flagyl.  Stool pathogen panel, Cdiff PCR are negative.  sxs improving, not resolved * Hypokalemia. Resolved.  * Contact dermatitis. Small patch on right forarm.     PLAN   *  Leave on [place] diet. *  Add Bentyl.  *  Add IVF with 10 of potassium as not eating/drinking much yet.     Jennye Moccasin  06/22/2014, 10:42 AM Pager: [phone]  GI ATTENDING  Interval history and data reviewed. Patient seen and examined. Clinically stable. Some abdominal discomfort. Exam has improved. Encouraged to ambulate and advance diet. Hopefully home soon.  [name]. Eda Keys., M.D. [facility] Division of Gastroenterology,

## 2014-06-22 NOTE — Progress Notes (Addendum)
PROGRESS NOTE  Blake Bradley WUJ:[id number] DOB: 1990-12-16 DOA: 06/19/2014 PCP: Nadean Corwin, MD  HPI/Subjective: Blake Bradley is a 23 y.o. male who presented with profuse diarrhea and vomiting. Since admission to floor, patient reports one bowel movement with a "more formed" stool. He continues to be nauseated but has had not episodes of vomiting. He is tolerating clears at this time. Abdominal pain has improved, but patient states it is difficult to sleep at night due to intermittent abdominal cramps.  Assessment/Plan: Abdominal pain / vomiting / diarrhea  Initially suspected infectious etiology. GI panel negative. Stool cultures pending.   Unfortunately WBC trending up-but very minimal-belly exam is benign-still soft CT scan shows Small Bowel and Colon thickening with PSBO.  GI recommends ruling out IBD as patient is on Accutane and mother has UC.  Continue on cipro/flagyl IV.  Bentyl added for abdominal cramps. Tolerating clears, but unable to tolerate full liquids thus far.    Patient continues to request IV pain medication q 2 hours. Will increase oral pain meds and give IV only for break thru. CCS has signed off.   Acne-Accutane use.  Accutane currently on hold.   Proteinuria  Greater than 300 on admit u/a-However repeat UA proteinuria decreased to 100. Could be from acute febrile illness  Creatinine within normal limits.  Stable for outpatient work up-suspect will need to repeat UA in the outpatient setting once recovers from acute illness   Diffuse skin rash  Over trunk, now resolved.   Per patient the rash started Thursday night with N/V/ AP.   Hypokalemia  Mild  Resolved. Is receiving low dose potassium in IVF.  DVT Prophylaxis:  Enoxaparin  Code Status:Full Family Communication:patient alert and oriented Disposition Plan: home when able  Consultants:  GI - [place].  CCS  Procedures:  None  Antibiotics: Anti-infectives   Start     Dose/Rate  Route Frequency Ordered Stop   06/20/14 0400  metroNIDAZOLE (FLAGYL) IVPB 500 mg     500 mg 100 mL/hr over 60 Minutes Intravenous 3 times per day 06/20/14 0248     06/20/14 0330  ciprofloxacin (CIPRO) IVPB 400 mg     400 mg 200 mL/hr over 60 Minutes Intravenous 2 times daily 06/20/14 0249        Objective: Filed Vitals:   06/21/14 0530 06/21/14 1439 06/21/14 2210 06/22/14 0552  BP: 134/77 131/74 136/71 133/69  Pulse: 87 81 72 75  Temp: 100 F (37.8 C) 98.6 F (37 C) 99.2 F (37.3 C) 98 F (36.7 C)  TempSrc: Oral Oral Oral Oral  Resp: Height:      Weight:      SpO2: 97% 98% 99% 97%    Intake/Output Summary (Last 24 hours) at 06/22/14 0958 Last data filed at 06/22/14 9562  Gross per 24 hour  Intake   1040 ml  Output   1300 ml  Net   -260 ml   Filed Weights   06/20/14 0130  Weight: 73.71 kg (162 lb 8 oz)    Exam: General: Well developed, well nourished, NAD, appears stated age  Cardiovascular: RRR, S1 S2 auscultated, no rubs, murmurs or gallops.   Respiratory: Clear to auscultation bilaterally with equal chest rise  Abdomen: Soft, slightly tender throughout, nondistended, + bowel sounds  Neuro: AAOx3, cranial nerves grossly intact.  Psych: Normal affect and demeanor with intact judgement and insight   Data Reviewed: Basic Metabolic Panel:  Recent Labs Lab 06/18/14 1347 06/19/14 1721 06/20/14 1308  06/22/14 0803  NA 132* 137 137 136*  K 3.8 3.6* 3.5* 3.7  CL 103 97 100 95*  CO2  --  GLUCOSE 129* 98 105* 92  BUN CREATININE 1.00 1.08 0.89 0.81  CALCIUM  --  9.3 8.3* 8.5   Liver Function Tests:  Recent Labs Lab 06/19/14 1721  AST 26  ALT 21  ALKPHOS 93  BILITOT 1.1  PROT 7.8  ALBUMIN 3.5    Recent Labs Lab 06/19/14 1856  LIPASE 38   CBC:  Recent Labs Lab 06/18/14 1347 06/19/14 1721 06/21/14 1156 06/22/14 0803  WBC  --  12.6* 10.5 11.8*  NEUTROABS  --  10.6* 8.0*  --   HGB 15.3 15.2 12.7* 13.5    HCT 45.0 42.3 37.6* 39.4  MCV  --  84.4 87.0 86.8  PLT  --  211 197 220   CBG:  Recent Labs Lab 06/20/14 0206  GLUCAP 91    Recent Results (from the past 240 hour(s))  CULTURE, BLOOD (ROUTINE X 2)     Status: None   Collection Time    06/20/14  5:15 AM      Result Value Ref Range Status   Specimen Description BLOOD RIGHT ARM   Final   Special Requests BOTTLES DRAWN AEROBIC AND ANAEROBIC 8CC EA   Final   Culture  Setup Time     Final   Value: 06/20/2014 09:39     Performed at [facility]   Culture     Final   Value:        BLOOD CULTURE RECEIVED NO GROWTH TO DATE CULTURE WILL BE HELD FOR 5 DAYS BEFORE ISSUING A FINAL NEGATIVE REPORT     Performed at [facility]   Report Status PENDING   Incomplete  CULTURE, BLOOD (ROUTINE X 2)     Status: None   Collection Time    06/20/14  5:27 AM      Result Value Ref Range Status   Specimen Description BLOOD LEFT HAND   Final   Special Requests BOTTLES DRAWN AEROBIC AND ANAEROBIC 5CC EA   Final   Culture  Setup Time     Final   Value: 06/20/2014 09:38     Performed at [facility]   Culture     Final   Value:        BLOOD CULTURE RECEIVED NO GROWTH TO DATE CULTURE WILL BE HELD FOR 5 DAYS BEFORE ISSUING A FINAL NEGATIVE REPORT     Performed at [facility]   Report Status PENDING   Incomplete  CLOSTRIDIUM DIFFICILE BY PCR     Status: None   Collection Time    06/20/14 10:53 AM      Result Value Ref Range Status   C difficile by pcr NEGATIVE  NEGATIVE Final     Studies: No results found.  Scheduled Meds: . antiseptic oral rinse  7 mL Mouth Rinse q12n4p  . ciprofloxacin  400 mg Intravenous BID  . enoxaparin (LOVENOX) injection  40 mg Subcutaneous Q24H  . metronidazole  500 mg Intravenous 3 times per day  . potassium chloride  40 mEq Oral Once   Continuous Infusions:   Principal Problem:   Abdominal pain Active Problems:   Nausea vomiting and diarrhea  Elvis Coil, PA-S2 Algis Downs, PA-C  [facility] Pager [phone]. If 7PM-7AM, please contact night-coverage at [email or web address], password [facility] 06/22/2014, 9:58 AM  LOS:  3 days   Attending  Patient was seen, examined,treatment plan was discussed with the Physician extender. I have directly reviewed the clinical findings, lab, imaging studies and management of this patient in detail. I have made the necessary changes to the above noted documentation, and agree with the documentation, as recorded by the Physician extender.  Windell Norfolk MD [facility].

## 2014-06-23 ENCOUNTER — Inpatient Hospital Stay (HOSPITAL_COMMUNITY): Payer: Medicaid Other

## 2014-06-23 DIAGNOSIS — K5289 Other specified noninfective gastroenteritis and colitis: Secondary | ICD-10-CM

## 2014-06-23 LAB — CBC
HCT: 40.1 % (ref 39.0–52.0)
HEMOGLOBIN: 13.7 g/dL (ref 13.0–17.0)
MCH: 29.3 pg (ref 26.0–34.0)
MCHC: 34.2 g/dL (ref 30.0–36.0)
MCV: 85.7 fL (ref 78.0–100.0)
Platelets: 284 10*3/uL (ref 150–400)
RBC: 4.68 MIL/uL (ref 4.22–5.81)
RDW: 12.8 % (ref 11.5–15.5)
WBC: 10.8 10*3/uL — ABNORMAL HIGH (ref 4.0–10.5)

## 2014-06-23 LAB — BASIC METABOLIC PANEL
Anion gap: 12 (ref 5–15)
BUN: 8 mg/dL (ref 6–23)
CHLORIDE: 98 meq/L (ref 96–112)
CO2: 29 meq/L (ref 19–32)
Calcium: 8.5 mg/dL (ref 8.4–10.5)
Creatinine, Ser: 0.86 mg/dL (ref 0.50–1.35)
GFR calc Af Amer: >90 mL/min (ref >90)
GFR calc non Af Amer: >90 mL/min (ref >90)
GLUCOSE: 94 mg/dL (ref 70–99)
POTASSIUM: 4 meq/L (ref 3.7–5.3)
SODIUM: 139 meq/L (ref 137–147)

## 2014-06-23 MED ORDER — DIPHENHYDRAMINE HCL 25 MG PO CAPS
25.0000 mg | ORAL_CAPSULE | Freq: Three times a day (TID) | ORAL | Status: AC
  Administered 2014-06-23 – 2014-06-24 (×3): 25 mg via ORAL
  Filled 2014-06-23 (×3): qty 1

## 2014-06-23 MED ORDER — CIPROFLOXACIN HCL 500 MG PO TABS
500.0000 mg | ORAL_TABLET | Freq: Two times a day (BID) | ORAL | Status: DC
  Administered 2014-06-23: 500 mg via ORAL
  Filled 2014-06-23 (×3): qty 1

## 2014-06-23 MED ORDER — POTASSIUM CHLORIDE 10 MEQ/100ML IV SOLN
10.0000 meq | INTRAVENOUS | Status: DC
  Administered 2014-06-23 (×2): 10 meq via INTRAVENOUS
  Filled 2014-06-23 (×4): qty 100

## 2014-06-23 MED ORDER — OXYCODONE HCL 5 MG PO TABS
5.0000 mg | ORAL_TABLET | ORAL | Status: DC | PRN
  Administered 2014-06-23 – 2014-06-24 (×4): 10 mg via ORAL
  Filled 2014-06-23 (×4): qty 2

## 2014-06-23 MED ORDER — HYDROMORPHONE HCL PF 1 MG/ML IJ SOLN: 0.5000 mg | INTRAMUSCULAR | Status: DC | PRN

## 2014-06-23 MED ORDER — FAMOTIDINE 20 MG PO TABS
20.0000 mg | ORAL_TABLET | Freq: Two times a day (BID) | ORAL | Status: AC
  Administered 2014-06-23 – 2014-06-24 (×2): 20 mg via ORAL
  Filled 2014-06-23 (×2): qty 1

## 2014-06-23 MED ORDER — METRONIDAZOLE 500 MG PO TABS
500.0000 mg | ORAL_TABLET | Freq: Three times a day (TID) | ORAL | Status: DC
  Administered 2014-06-23: 500 mg via ORAL
  Filled 2014-06-23 (×4): qty 1

## 2014-06-23 MED ORDER — POTASSIUM CHLORIDE 10 MEQ/100ML IV SOLN
10.0000 meq | INTRAVENOUS | Status: AC
  Filled 2014-06-23 (×2): qty 100

## 2014-06-23 MED ORDER — OXYCODONE HCL 5 MG PO TABS: 5.0000 mg | ORAL_TABLET | ORAL | Status: DC | PRN

## 2014-06-23 MED ORDER — NYSTATIN 100000 UNIT/GM EX POWD
Freq: Two times a day (BID) | CUTANEOUS | Status: DC
  Filled 2014-06-23: qty 15

## 2014-06-23 MED ORDER — BARRIER CREAM NON-SPECIFIED
1.0000 "application " | TOPICAL_CREAM | Freq: Two times a day (BID) | TOPICAL | Status: DC | PRN
  Filled 2014-06-23: qty 1

## 2014-06-23 NOTE — Progress Notes (Signed)
Patient with large rash to back, appears with welps, reddened. Not complaining of pain, just "itching." Notified Claiborne Billings, NP who stated to hold tonight's dose of Bentyl . NP stated that he would place orders.

## 2014-06-23 NOTE — Progress Notes (Signed)
Small bowel follow-through in progress. See earlier for progress note. I am Discontinuing antibiotics.  [name]. Eda Keys., M.D. [facility] Division of Gastroenterology

## 2014-06-23 NOTE — Care Management Note (Signed)
    Page 1 of 1   06/28/2014     3:19:19 PM CARE MANAGEMENT NOTE 06/28/2014  Patient:  Blake Bradley, Blake Bradley   Account Number:  [id number]  Date Initiated:  06/23/2014  Documentation initiated by:  Letha Cape  Subjective/Objective Assessment:   dx gastreoenteritis, dehydration  admit- lives with parents. pta indep.     Action/Plan:   Anticipated DC Date:  06/28/2014   Anticipated DC Plan:  HOME/[place] CARE      DC Planning Services  CM consult      Choice offered to / List presented to:             Status of service:  Completed, signed off Medicare Important Message given?  NO (If response is "NO", the following Medicare IM given date fields will be blank) Date Medicare IM given:   Medicare IM given by:   Date Additional Medicare IM given:   Additional Medicare IM given by:    Discharge Disposition:  HOME/[place] CARE  Per UR Regulation:  Reviewed for med. necessity/level of care/duration of stay  If discussed at Long Length of Stay Meetings, dates discussed:    Comments:  PCP [name]  06/28/14 1517 Letha Cape RN, BSN [phone] patient is for dc today, Patient has a PCP and states parents will help get his medications.  06/23/14 1615 Letha Cape RN,BSN [phone] patient lives with parents, patient has a PCP, patient states his parents will help him get his medications at dc. Patient may still need medication asst at dc.  Will inform weekend NCM.

## 2014-06-23 NOTE — Progress Notes (Signed)
PROGRESS NOTE  Blake Bradley ZOX:[id number] DOB: 04/29/1991 DOA: 06/19/2014 PCP: Nadean Corwin, MD  HPI/Subjective: Blake Bradley is a 23 y.o. Male who presented to ED 06/19/14 with profuse diarrhea and vomiting. Abdominal pain has been persistent however improving, and oral pain medication has been tolerated well. He has had two bowel movements since admission, including one this morning, which was "normal" according to the patient. He has had no episodes of vomiting but continues to be nauseated and is currently only tolerating clear liquids.   He ambulated some yesterday with his father for about 5 minutes but stated he had to stop because he felt lightheaded. He also stated he feels like there is "a lot of pressure" in his lower abdomen.  Assessment/Plan:  Abdominal pain / vomiting / diarrhea  -secondary to enteritis -Suspected infectious etiology. However GI panel negative, Stool cultures preliminary result reports no suspicious colonies, C Diff PCR neg. -Admitted and started on IVF, kept NPO, and empiric Cipro/Flagyl-now day 4.Slow improvement continues, currently on liquid diet, but abd exam much improved than few days back. Diarrhea/vomiting has resolved.WBC trending back down. Will continue with supportive care.   Acne-Accutane use.  Accutane currently on hold.   Proteinuria  Greater than 300 on admit u/a-However repeat UA proteinuria decreased to 100. Could be from acute febrile illness  Creatinine within normal limits.  Stable for outpatient work up-suspect will need to repeat UA in the outpatient setting once recovers from acute illness   Diffuse skin rash  Over trunk, now resolved.  Per patient the rash started Thursday night with N/V/ AP.   Hypokalemia  Mild  Resolved. Is receiving low dose potassium in IVF.  DVT Prophylaxis:  Enoxaparin  Code Status: Full Family Communication: patient alert and oriented Disposition Plan: home when able  Consultants:  GI-  [place]  [facility]  Procedures:  None  Antibiotics: Anti-infectives   Start     Dose/Rate Route Frequency Ordered Stop   06/20/14 0400  metroNIDAZOLE (FLAGYL) IVPB 500 mg     500 mg 100 mL/hr over 60 Minutes Intravenous 3 times per day 06/20/14 0248     06/20/14 0330  ciprofloxacin (CIPRO) IVPB 400 mg     400 mg 200 mL/hr over 60 Minutes Intravenous 2 times daily 06/20/14 0249        Objective: Filed Vitals:   06/22/14 0552 06/22/14 1344 06/22/14 2144 06/23/14 0540  BP: 133/69 131/57 123/66 119/62  Pulse: 75 65 68 52  Temp: 98 F (36.7 C) 98.5 F (36.9 C) 98.5 F (36.9 C) 98.3 F (36.8 C)  TempSrc: Oral Oral Oral Oral  Resp: Height:      Weight:      SpO2: 97% 100% 100% 99%    Intake/Output Summary (Last 24 hours) at 06/23/14 0846 Last data filed at 06/22/14 1855  Gross per 24 hour  Intake 566.67 ml  Output    300 ml  Net 266.67 ml   Filed Weights   06/20/14 0130  Weight: 73.71 kg (162 lb 8 oz)    Exam: General: Well developed, well nourished, NAD, appears stated age  Cardiovascular: RRR, S1 S2 auscultated, no rubs, murmurs or gallops.   Respiratory: Clear to auscultation bilaterally with equal chest rise  Abdomen: Soft, slightly tender to moderate palpation, slightly distended, + bowel sounds  Neuro: AAOx3, cranial nerves grossly intact.  Psych: Normal affect and demeanor with intact judgement and insight   Data Reviewed: Basic Metabolic Panel:  Recent Labs  Lab 06/18/14 1347 06/19/14 1721 06/20/14 1308 06/22/14 0803 06/22/14 1130  NA 132* 137 137 136* 138  K 3.8 3.6* 3.5* 3.7 3.5*  CL 103 97 100 95* 97  CO2  --  GLUCOSE 129* 98 105* 92 89  BUN CREATININE 1.00 1.08 0.89 0.81 0.78  CALCIUM  --  9.3 8.3* 8.5 8.4   Liver Function Tests:  Recent Labs Lab 06/19/14 1721  AST 26  ALT 21  ALKPHOS 93  BILITOT 1.1  PROT 7.8  ALBUMIN 3.5    Recent Labs Lab 06/19/14 1856  LIPASE 38   CBC:  Recent  Labs Lab 06/18/14 1347 06/19/14 1721 06/21/14 1156 06/22/14 0803 06/23/14 0706  WBC  --  12.6* 10.5 11.8* 10.8*  NEUTROABS  --  10.6* 8.0*  --   --   HGB 15.3 15.2 12.7* 13.5 13.7  HCT 45.0 42.3 37.6* 39.4 40.1  MCV  --  84.4 87.0 86.8 85.7  PLT  --  211 197 220 284   CBG:  Recent Labs Lab 06/20/14 0206  GLUCAP 91    Recent Results (from the past 240 hour(s))  CULTURE, BLOOD (ROUTINE X 2)     Status: None   Collection Time    06/20/14  5:15 AM      Result Value Ref Range Status   Specimen Description BLOOD RIGHT ARM   Final   Special Requests BOTTLES DRAWN AEROBIC AND ANAEROBIC 8CC EA   Final   Culture  Setup Time     Final   Value: 06/20/2014 09:39     Performed at [facility]   Culture     Final   Value:        BLOOD CULTURE RECEIVED NO GROWTH TO DATE CULTURE WILL BE HELD FOR 5 DAYS BEFORE ISSUING A FINAL NEGATIVE REPORT     Performed at [facility]   Report Status PENDING   Incomplete  CULTURE, BLOOD (ROUTINE X 2)     Status: None   Collection Time    06/20/14  5:27 AM      Result Value Ref Range Status   Specimen Description BLOOD LEFT HAND   Final   Special Requests BOTTLES DRAWN AEROBIC AND ANAEROBIC 5CC EA   Final   Culture  Setup Time     Final   Value: 06/20/2014 09:38     Performed at [facility]   Culture     Final   Value:        BLOOD CULTURE RECEIVED NO GROWTH TO DATE CULTURE WILL BE HELD FOR 5 DAYS BEFORE ISSUING A FINAL NEGATIVE REPORT     Performed at [facility]   Report Status PENDING   Incomplete  CLOSTRIDIUM DIFFICILE BY PCR     Status: None   Collection Time    06/20/14 10:53 AM      Result Value Ref Range Status   C difficile by pcr NEGATIVE  NEGATIVE Final  STOOL CULTURE     Status: None   Collection Time    06/20/14 10:53 AM      Result Value Ref Range Status   Specimen Description STOOL   Final   Special Requests NONE   Final   Culture     Final   Value: NO SUSPICIOUS COLONIES, CONTINUING TO  HOLD     Performed at [facility]   Report Status PENDING   Incomplete  Studies: Dg Abd 2 Views  06/23/2014   CLINICAL DATA:  Abdominal pain  EXAM: ABDOMEN - 2 VIEW  COMPARISON:  06/19/2014  FINDINGS: Scattered large and small bowel gas is identified. Dilatation of small bowel is again identified but the colonic gas would indicate more of a partial small bowel obstruction or ileus. Correlation with physical exam is recommended. The overall appearance is stable from that seen on recent CT. No acute mass is noted. No abnormal calcifications or abnormal bony changes are noted.  IMPRESSION: Persistent small bowel dilatation stable from the prior exam. This likely represents a partial small bowel obstruction or ileus. Correlation with the physical exam is recommended.   Electronically Signed   By: Alcide Clever M.D.   On: 06/23/2014 07:45    Scheduled Meds: . antiseptic oral rinse  7 mL Mouth Rinse q12n4p  . ciprofloxacin  400 mg Intravenous BID  . dicyclomine  10 mg Oral TID AC & HS  . enoxaparin (LOVENOX) injection  40 mg Subcutaneous Q24H  . Influenza vac split quadrivalent PF  0.5 mL Intramuscular Tomorrow-1000  . metronidazole  500 mg Intravenous 3 times per day  . potassium chloride  10 mEq Intravenous Q1 Hr x 4   Continuous Infusions: . dextrose 5 % and 0.45 % NaCl with KCl 10 mEq/L 100 mL/hr at 06/23/14 0232    Principal Problem:   Abdominal pain Active Problems:   Nausea vomiting and diarrhea  Elvis Coil, PA-S2   [facility] Pager [phone]. If 7PM-7AM, please contact night-coverage at [email or web address], password [facility] 06/23/2014, 8:46 AM  LOS: 4 days   Attending Patient was seen, examined,treatment plan was discussed with the Physician extender. I have directly reviewed the clinical findings, lab, imaging studies and management of this patient in detail. I have made the necessary changes to the above noted documentation, and agree with the documentation, as  recorded by the Physician extender.  Windell Norfolk MD [facility].

## 2014-06-23 NOTE — Progress Notes (Signed)
Daily Rounding Note  06/23/2014, 8:30 AM  LOS: 4 days   SUBJECTIVE:       Still with 7/10 pain (does not look it).  Some nausea.  Tolerating clears but does not like texture of jello so not eating this.   OBJECTIVE:         Vital signs in last 24 hours:    Temp:  [98.3 F (36.8 C)-98.5 F (36.9 C)] 98.3 F (36.8 C) (09/11 0540) Pulse Rate:  [52-68] 52 (09/11 0540) Resp:  [17-20] 20 (09/11 0540) BP: (119-131)/(57-66) 119/62 mmHg (09/11 0540) SpO2:  [99 %-100 %] 99 % (09/11 0540) Last BM Date: 06/21/14 General: pale, not toxic or acutely ill looking   Heart: RRR Chest: clear bil Abdomen: soft, minor non-focal tenderness.  BS not tinkling or tympanitic.  Not distended  Extremities: no CCE Neuro/Psych:  Alert, cooperative, no deficits.   Intake/Output from previous day: 09/10 0701 - 09/11 0700 In: 566.7 [I.V.:566.7] Out: 300 [Urine:300]  Intake/Output this shift:    Lab Results:  Recent Labs  06/21/14 1156 06/22/14 0803 06/23/14 0706  WBC 10.5 11.8* 10.8*  HGB 12.7* 13.5 13.7  HCT 37.6* 39.4 40.1  PLT 197 220 284   BMET  Recent Labs  06/20/14 1308 06/22/14 0803 06/22/14 1130  NA 137 136* 138  K 3.5* 3.7 3.5*  CL 100 95* 97  CO2 GLUCOSE 105* 92 89  BUN CREATININE 0.89 0.81 0.78  CALCIUM 8.3* 8.5 8.4    Studies/Results: Dg Abd 2 Views 06/23/2014   CLINICAL DATA:  Abdominal pain  EXAM: ABDOMEN - 2 VIEW  COMPARISON:  06/19/2014  FINDINGS: Scattered large and small bowel gas is identified. Dilatation of small bowel is again identified but the colonic gas would indicate more of a partial small bowel obstruction or ileus. Correlation with physical exam is recommended. The overall appearance is stable from that seen on recent CT. No acute mass is noted. No abnormal calcifications or abnormal bony changes are noted.  IMPRESSION: Persistent small bowel dilatation stable from the prior  exam. This likely represents a partial small bowel obstruction or ileus. Correlation with the physical exam is recommended.   Electronically Signed   By: Alcide Clever M.D.   On: 06/23/2014 07:45   Scheduled Meds: . antiseptic oral rinse  7 mL Mouth Rinse q12n4p  . ciprofloxacin  500 mg Oral BID  . dicyclomine  10 mg Oral TID AC & HS  . enoxaparin (LOVENOX) injection  40 mg Subcutaneous Q24H  . Influenza vac split quadrivalent PF  0.5 mL Intramuscular Tomorrow-1000  . metroNIDAZOLE  500 mg Oral 3 times per day  . nystatin   Topical BID  . potassium chloride  10 mEq Intravenous Q1 Hr x 2   Continuous Infusions: . dextrose 5 % and 0.45 % NaCl with KCl 10 mEq/L 100 mL/hr at 06/23/14 0232   PRN Meds:.acetaminophen, acetaminophen, diphenhydrAMINE-zinc acetate, ondansetron (ZOFRAN) IV, ondansetron, oxyCODONE   ASSESMENT:   * SB enteritis with possible PSBO. Acute onset of sxs with fever weighs in favor of infectious etiology.  However mother has UC and pt on isotretinoin which can cause IBD and sigmoid colon thickened per CT so need to rule out IBD/Crohn's.  The isotretinoin has been discontinued. Surgery consulted 9/9: favor dx of infectious over inflammatory etiology.  Day 4 IV cipro and IV flagyl. Today converted to po Flagyl and Cipro.  Stool pathogen panel, Cdiff PCR are negative.  Abdominal xrays this AM show persistent SB dilatation: PSBO vs ileus. Is taking Oxycodone 10 to 20 mg daily. Dilaudid stopped yesterday. Scheduled Bentyl started yesterday.  Still with pain and some nausea.  * Hypokalemia. Has 10 of K in his IVF. Also 2 runs of potassium ordered.  * Contact dermatitis. Small patch on right forarm.     PLAN   *  Per Dr Marina Goodell.  Dr Elnoria Howard covering the weekend.  *  Leave on clears as not inclined to advance to full liquids: too much dairy, and not convinced he will tolerate low res diet. *  Encouraged pt to ambulate in hall.   *  BMET in AM      Jennye Moccasin  06/23/2014,  8:30 AM Pager: [phone]  GI ATTENDING  History and interval data reviewed. Patient personally seen and examined. Father in room. Patient looks well but complains of abdominal pain. Has not had a bowel movement. Increase small bowel gas on KUB today. Question ileus versus partial obstruction. Suspect the former. GERD and assess him up for small bowel follow-through to rule out obstruction. If negative, discontinue any medications that decrease GI motility. Advance diet as tolerated. Increase activity (he has not gotten out of bed much).  [name]. Eda Keys., M.D. [facility] Division of Gastroenterology

## 2014-06-24 DIAGNOSIS — K56609 Unspecified intestinal obstruction, unspecified as to partial versus complete obstruction: Secondary | ICD-10-CM

## 2014-06-24 DIAGNOSIS — L708 Other acne: Secondary | ICD-10-CM

## 2014-06-24 LAB — STOOL CULTURE

## 2014-06-24 LAB — BASIC METABOLIC PANEL
Anion gap: 10 (ref 5–15)
BUN: 8 mg/dL (ref 6–23)
CO2: 28 mEq/L (ref 19–32)
Calcium: 8.2 mg/dL — ABNORMAL LOW (ref 8.4–10.5)
Chloride: 102 mEq/L (ref 96–112)
Creatinine, Ser: 0.83 mg/dL (ref 0.50–1.35)
GFR calc Af Amer: >90 mL/min (ref >90)
GLUCOSE: 90 mg/dL (ref 70–99)
POTASSIUM: 3.5 meq/L — AB (ref 3.7–5.3)
Sodium: 140 mEq/L (ref 137–147)

## 2014-06-24 MED ORDER — METHYLPREDNISOLONE SODIUM SUCC 125 MG IJ SOLR
60.0000 mg | Freq: Once | INTRAMUSCULAR | Status: AC
  Administered 2014-06-24: 60 mg via INTRAVENOUS
  Filled 2014-06-24: qty 2
  Filled 2014-06-24: qty 0.96

## 2014-06-24 NOTE — Progress Notes (Signed)
Reluctant to place a NGT-however feels much better today-no abd pain at all, passing flatus, and wants to eat. Abdomen with NO tenderness at all, not distented. Will hold of on placing NGT for now, trial of clear liquids. If vomiting recurs, or has worsening abd pain-will keep NPO and then place NGT. Repeat Abd Xray in am.

## 2014-06-24 NOTE — Progress Notes (Signed)
No change in rash on back since previous note. Pt complaining of "a little itching," areas still pink colored and in same location. No spread noticed.  Administered scheduled benadryl  PO. Will continue to monitor .

## 2014-06-24 NOTE — Progress Notes (Signed)
PATIENT DETAILS Name: Blake Bradley Age: 23 y.o. Sex: male Date of Birth: 06/06/1991 Admit Date: 06/19/2014 Admitting Physician Eduard Clos, MD NFA:[id number],HQIONGE DAVID, MD  Subjective: Had BM-no vomiting, abd pain better-but still ongoing  Assessment/Plan: Principal Problem: Abdominal pain / vomiting / diarrhea  -secondary to enteritis  -Suspected infectious etiology. However GI panel negative, Stool cultures preliminary result reports no suspicious colonies, C Diff PCR neg.  -Admitted and started on IVF, kept NPO, and empiric Cipro/Flagyl stopped after 4 days of Tx. Unfortunately slow improvement, looks like patient has now developed SBO. Initially started on clear liquids-but now back to NPO  ?Developing SBO -likely secondary to enteritis-mucosal thickening -GI recommending-NGT-patient reluctant. -c/w supportive care  Diffuse skin rash  -recurred last night-suspect hives-off all antibiotics-give one dose of Solumedrol, c/w Benadryl and pepcid.   Acne-Accutane use.  Accutane currently on hold.   Proteinuria  -Greater than 300 on admit u/a-However repeat UA proteinuria decreased to 100. Could be from acute febrile illness  -Creatinine within normal limits.  -Stable for outpatient work up-suspect will need to repeat UA in the outpatient setting once recovers from acute illness   Disposition: Remain inpatient  DVT Prophylaxis: Prophylactic Lovenox   Code Status: Full code   Family Communication Father at bedside   Procedures:  None  CONSULTS:  GI and general surgery  Time spent 40 minutes-which includes 50% of the time with face-to-face with patient/ family and coordinating care related to the above assessment and plan.    MEDICATIONS: Scheduled Meds: . antiseptic oral rinse  7 mL Mouth Rinse q12n4p  . diphenhydrAMINE  25 mg Oral 3 times per day  . enoxaparin (LOVENOX) injection  40 mg Subcutaneous Q24H  . Influenza vac split quadrivalent  PF  0.5 mL Intramuscular Tomorrow-1000  . nystatin   Topical BID   Continuous Infusions: . dextrose 5 % and 0.45 % NaCl with KCl 10 mEq/L 100 mL/hr at 06/24/14 0929   PRN Meds:.acetaminophen, acetaminophen, ondansetron (ZOFRAN) IV, ondansetron, oxyCODONE  Antibiotics: Anti-infectives   Start     Dose/Rate Route Frequency Ordered Stop   06/23/14 0930  ciprofloxacin (CIPRO) tablet 500 mg  Status:  Discontinued     500 mg Oral 2 times daily 06/23/14 0923 06/23/14 1708   06/23/14 0930  metroNIDAZOLE (FLAGYL) tablet 500 mg  Status:  Discontinued     500 mg Oral 3 times per day 06/23/14 0923 06/23/14 1708   06/20/14 0400  metroNIDAZOLE (FLAGYL) IVPB 500 mg  Status:  Discontinued     500 mg 100 mL/hr over 60 Minutes Intravenous 3 times per day 06/20/14 0248 06/23/14 0922   06/20/14 0330  ciprofloxacin (CIPRO) IVPB 400 mg  Status:  Discontinued     400 mg 200 mL/hr over 60 Minutes Intravenous 2 times daily 06/20/14 0249 06/23/14 0922       PHYSICAL EXAM: Vital signs in last 24 hours: Filed Vitals:   06/23/14 1431 06/23/14 2241 06/24/14 0655 06/24/14 1323  BP: 119/55 120/59 114/61 116/59  Pulse: 61 49 47 56  Temp: 98.2 F (36.8 C) 98.6 F (37 C) 98 F (36.7 C) 98.5 F (36.9 C)  TempSrc: Oral Oral Oral Oral  Resp: Height:      Weight:      SpO2: 100% 100% 99% 100%    Weight change:  Filed Weights   06/20/14 0130  Weight: 73.71 kg (162 lb 8 oz)   Body mass index  is 20.63 kg/(m^2).   Gen Exam: Awake and alert with clear speech.  Neck: Supple, No JVD.   Chest: B/L Clear.   CVS: S1 S2 Regular, no murmurs.  Abdomen: soft, BS +, mildly tender in the periumblical area, non distended.  Extremities: no edema, lower extremities warm to touch. Neurologic: Non Focal.  Skin: No Rash.   Wounds: N/A.    Intake/Output from previous day:  Intake/Output Summary (Last 24 hours) at 06/24/14 1334 Last data filed at 06/24/14 0929  Gross per 24 hour  Intake      0 ml    Output      0 ml  Net      0 ml     LAB RESULTS: CBC  Recent Labs Lab 06/18/14 1347 06/19/14 1721 06/21/14 1156 06/22/14 0803 06/23/14 0706  WBC  --  12.6* 10.5 11.8* 10.8*  HGB 15.3 15.2 12.7* 13.5 13.7  HCT 45.0 42.3 37.6* 39.4 40.1  PLT  --  211 197 220 284  MCV  --  84.4 87.0 86.8 85.7  MCH  --  30.3 29.4 29.7 29.3  MCHC  --  35.9 33.8 34.3 34.2  RDW  --  12.5 12.7 12.9 12.8  LYMPHSABS  --  0.7 0.8  --   --   MONOABS  --  1.2* 1.3*  --   --   EOSABS  --  0.1 0.3  --   --   BASOSABS  --  0.0 0.0  --   --     Chemistries   Recent Labs Lab 06/20/14 1308 06/22/14 0803 06/22/14 1130 06/23/14 0706 06/24/14 0628  NA 137 136* 138 139 140  K 3.5* 3.7 3.5* 4.0 3.5*  CL 100 95* 97 98 102  CO2 GLUCOSE 105* 92 89 94 90  BUN CREATININE 0.89 0.81 0.78 0.86 0.83  CALCIUM 8.3* 8.5 8.4 8.5 8.2*    CBG:  Recent Labs Lab 06/20/14 0206  GLUCAP 91    GFR Estimated Creatinine Clearance: 148 ml/min (by C-G formula based on Cr of 0.83).  Coagulation profile No results found for this basename: INR, PROTIME,  in the last 168 hours  Cardiac Enzymes No results found for this basename: CK, CKMB, TROPONINI, MYOGLOBIN,  in the last 168 hours  No components found with this basename: POCBNP,  No results found for this basename: DDIMER,  in the last 72 hours No results found for this basename: HGBA1C,  in the last 72 hours No results found for this basename: CHOL, HDL, LDLCALC, TRIG, CHOLHDL, LDLDIRECT,  in the last 72 hours No results found for this basename: TSH, T4TOTAL, FREET3, T3FREE, THYROIDAB,  in the last 72 hours No results found for this basename: VITAMINB12, FOLATE, FERRITIN, TIBC, IRON, RETICCTPCT,  in the last 72 hours No results found for this basename: LIPASE, AMYLASE,  in the last 72 hours  Urine Studies No results found for this basename: UACOL, UAPR, USPG, UPH, UTP, UGL, UKET, UBIL, UHGB, UNIT, UROB, ULEU, UEPI, UWBC, URBC,  UBAC, CAST, CRYS, UCOM, BILUA,  in the last 72 hours  MICROBIOLOGY: Recent Results (from the past 240 hour(s))  CULTURE, BLOOD (ROUTINE X 2)     Status: None   Collection Time    06/20/14  5:15 AM      Result Value Ref Range Status   Specimen Description BLOOD RIGHT ARM   Final   Special Requests BOTTLES DRAWN AEROBIC AND  ANAEROBIC 8CC EA   Final   Culture  Setup Time     Final   Value: 06/20/2014 09:39     Performed at [facility]   Culture     Final   Value:        BLOOD CULTURE RECEIVED NO GROWTH TO DATE CULTURE WILL BE HELD FOR 5 DAYS BEFORE ISSUING A FINAL NEGATIVE REPORT     Performed at [facility]   Report Status PENDING   Incomplete  CULTURE, BLOOD (ROUTINE X 2)     Status: None   Collection Time    06/20/14  5:27 AM      Result Value Ref Range Status   Specimen Description BLOOD LEFT HAND   Final   Special Requests BOTTLES DRAWN AEROBIC AND ANAEROBIC 5CC EA   Final   Culture  Setup Time     Final   Value: 06/20/2014 09:38     Performed at [facility]   Culture     Final   Value:        BLOOD CULTURE RECEIVED NO GROWTH TO DATE CULTURE WILL BE HELD FOR 5 DAYS BEFORE ISSUING A FINAL NEGATIVE REPORT     Performed at [facility]   Report Status PENDING   Incomplete  CLOSTRIDIUM DIFFICILE BY PCR     Status: None   Collection Time    06/20/14 10:53 AM      Result Value Ref Range Status   C difficile by pcr NEGATIVE  NEGATIVE Final  STOOL CULTURE     Status: None   Collection Time    06/20/14 10:53 AM      Result Value Ref Range Status   Specimen Description STOOL   Final   Special Requests NONE   Final   Culture     Final   Value: NO SALMONELLA, SHIGELLA, CAMPYLOBACTER, YERSINIA, OR E.COLI 0157:H7 ISOLATED     Performed at [facility]   Report Status 06/24/2014 FINAL   Final    RADIOLOGY STUDIES/RESULTS: Ct Abdomen Pelvis W Contrast  06/19/2014   CLINICAL DATA:  Low abdominal pain and fever for 4 days. Nausea and  vomiting.  EXAM: CT ABDOMEN AND PELVIS WITH CONTRAST  TECHNIQUE: Multidetector CT imaging of the abdomen and pelvis was performed using the standard protocol following bolus administration of intravenous contrast.  CONTRAST:  OMNIPAQUE IOHEXOL 300 MG/ML  SOLN  COMPARISON:  None.  FINDINGS: The liver, spleen, gallbladder, pancreas, adrenal glands, kidneys, abdominal aorta, inferior vena cava, and retroperitoneal lymph nodes are unremarkable. Small accessory spleen. Air-fluid levels in the colon consistent with liquid stool. No colonic distention or wall thickening. Small bowel are mildly distended proximally with distal decompression. Wall thickening is suggested in distal small bowel. Changes could represent infectious enteritis or inflammatory enteritis such as Crohn's disease. No definite abscess. There is mild edema/stranding in the mesenteric. Moderately prominent mesenteric lymph nodes are likely reactive. No free air or free fluid in the abdomen.  Pelvis: Bladder wall is not thickened. No free or loculated pelvic fluid collections. Mild thickening of decompressed sigmoid colonic wall. No destructive bone lesions.  IMPRESSION: Wall thickening in the distal small bowel with mesenteric edema and prominent mesenteric lymph nodes. Dilatation of proximal small bowel. Changes may represent infectious or inflammatory enteritis. Partial small bowel obstruction due to stricturing is not excluded. Sigmoid colon also appears thickened, also possibly infectious or inflammatory.   Electronically Signed   By: Marisa Cyphers.D.  On: 06/19/2014 23:14   Dg Small Bowel  06/23/2014   CLINICAL DATA:  23 year old male with Crohn's disease, abdominal and pelvic pain, nausea vomiting and diarrhea.  EXAM: SMALL BOWEL SERIES  COMPARISON:  06/19/2014 CT  TECHNIQUE: Following ingestion of thin barium, serial small bowel images were obtained.  FLUOROSCOPY TIME:  0 min 0 seconds  FINDINGS: A scout film of the abdomen  demonstrates distended small bowel loops. Gas in the colon and a small amount a gas in the rectum noted.  After administration of oral contrast, contrast flows freely into the proximal small bowel. Following this, very slow progression of oral contrast is noted with distended mid small bowel loops noted. Contrast reaches the colon by 5 hr. Contrast within the distal small bowel is very dilute and therefore spot compression view was not performed.  IMPRESSION: Distended proximal small bowel loops with very slow progression of contrast into the colon. This likely represents a partial small bowel obstruction. Spot compression views were not obtained given very diluted contrast within the distal small bowel.   Electronically Signed   By: Laveda Abbe M.D.   On: 06/23/2014 22:51   Dg Abd 2 Views  06/23/2014   CLINICAL DATA:  Abdominal pain  EXAM: ABDOMEN - 2 VIEW  COMPARISON:  06/19/2014  FINDINGS: Scattered large and small bowel gas is identified. Dilatation of small bowel is again identified but the colonic gas would indicate more of a partial small bowel obstruction or ileus. Correlation with physical exam is recommended. The overall appearance is stable from that seen on recent CT. No acute mass is noted. No abnormal calcifications or abnormal bony changes are noted.  IMPRESSION: Persistent small bowel dilatation stable from the prior exam. This likely represents a partial small bowel obstruction or ileus. Correlation with the physical exam is recommended.   Electronically Signed   By: Alcide Clever M.D.   On: 06/23/2014 07:45    Jeoffrey Massed, MD  [facility] Pager:[phone]  If 7PM-7AM, please contact night-coverage [email or web address] Password [email or web address] 06/24/2014, 1:34 PM   LOS: 5 days   **Disclaimer: This note may have been dictated with voice recognition software. Similar sounding words can inadvertently be transcribed and this note may contain transcription errors which may not have been corrected  upon publication of note.**

## 2014-06-24 NOTE — Progress Notes (Signed)
Subjective: No change.  Nauseated with ambulation.  Objective: Vital signs in last 24 hours: Temp:  [98 F (36.7 C)-98.6 F (37 C)] 98 F (36.7 C) (09/12 0655) Pulse Rate:  [47-61] 47 (09/12 0655) Resp:  [12-14] 12 (09/12 0655) BP: (114-120)/(55-61) 114/61 mmHg (09/12 0655) SpO2:  [99 %-100 %] 99 % (09/12 0655) Last BM Date: 06/22/14  Intake/Output from previous day:   Intake/Output this shift:    General appearance: alert and no distress GI: soft, non-tender; bowel sounds normal; no masses,  no organomegaly  Lab Results:  Recent Labs  06/21/14 1156 06/22/14 0803 06-28-14 0706  WBC 10.5 11.8* 10.8*  HGB 12.7* 13.5 13.7  HCT 37.6* 39.4 40.1  PLT 197 220 284   BMET  Recent Labs  06/22/14 1130 June 28, 2014 0706 06/24/14 0628  NA 138 139 140  K 3.5* 4.0 3.5*  CL 97 98 102  CO2 GLUCOSE 89 94 90  BUN CREATININE 0.78 0.86 0.83  CALCIUM 8.4 8.5 8.2*   LFT No results found for this basename: PROT, ALBUMIN, AST, ALT, ALKPHOS, BILITOT, BILIDIR, IBILI,  in the last 72 hours PT/INR No results found for this basename: LABPROT, INR,  in the last 72 hours Hepatitis Panel No results found for this basename: HEPBSAG, HCVAB, HEPAIGM, HEPBIGM,  in the last 72 hours C-Diff No results found for this basename: CDIFFTOX,  in the last 72 hours Fecal Lactopherrin No results found for this basename: FECLLACTOFRN,  in the last 72 hours  Studies/Results: Dg Small Bowel  Jun 28, 2014   CLINICAL DATA:  23 year old male with Crohn's disease, abdominal and pelvic pain, nausea vomiting and diarrhea.  EXAM: SMALL BOWEL SERIES  COMPARISON:  06/19/2014 CT  TECHNIQUE: Following ingestion of thin barium, serial small bowel images were obtained.  FLUOROSCOPY TIME:  0 min 0 seconds  FINDINGS: A scout film of the abdomen demonstrates distended small bowel loops. Gas in the colon and a small amount a gas in the rectum noted.  After administration of oral contrast, contrast flows  freely into the proximal small bowel. Following this, very slow progression of oral contrast is noted with distended mid small bowel loops noted. Contrast reaches the colon by 5 hr. Contrast within the distal small bowel is very dilute and therefore spot compression view was not performed.  IMPRESSION: Distended proximal small bowel loops with very slow progression of contrast into the colon. This likely represents a partial small bowel obstruction. Spot compression views were not obtained given very diluted contrast within the distal small bowel.   Electronically Signed   By: Laveda Abbe M.D.   On: 2014/06/28 22:51   Dg Abd 2 Views  June 28, 2014   CLINICAL DATA:  Abdominal pain  EXAM: ABDOMEN - 2 VIEW  COMPARISON:  06/19/2014  FINDINGS: Scattered large and small bowel gas is identified. Dilatation of small bowel is again identified but the colonic gas would indicate more of a partial small bowel obstruction or ileus. Correlation with physical exam is recommended. The overall appearance is stable from that seen on recent CT. No acute mass is noted. No abnormal calcifications or abnormal bony changes are noted.  IMPRESSION: Persistent small bowel dilatation stable from the prior exam. This likely represents a partial small bowel obstruction or ileus. Correlation with the physical exam is recommended.   Electronically Signed   By: Alcide Clever M.D.   On: 2014/06/28 07:45    Medications:  Scheduled: . antiseptic oral rinse  7 mL Mouth Rinse q12n4p  . diphenhydrAMINE  25 mg Oral 3 times per day  . enoxaparin (LOVENOX) injection  40 mg Subcutaneous Q24H  . famotidine  20 mg Oral BID  . Influenza vac split quadrivalent PF  0.5 mL Intramuscular Tomorrow-1000  . nystatin   Topical BID   Continuous: . dextrose 5 % and 0.45 % NaCl with KCl 10 mEq/L 100 mL/hr at 06/24/14 0929    Assessment/Plan: 1) Partial SBO. 2) Acute gastroenteritis.   The SBFT reveals a partial SBO.  There is very slow movement of contrast  through his intestines.  No reports of any vomiting and he was able to tolerate the liquid contrast.  He does not appear to be making any significant progression.  Plan: 1) NG tube with low intermittent suctioning.  He is uncertain about this recommendation at this time, but I feel this is required with his lack of improvement and current SBFT findings.  He wants to discuss this issue with his parents.   LOS: 5 days   Andriea Hasegawa D 06/24/2014, 10:40 AM

## 2014-06-24 NOTE — Progress Notes (Signed)
Patient given benadryl  PO at 2226 per provider on call's orders. Rash more pink on patient's back, seems to be improving. Pt states relief from itching. Will continue to monitor patient and rash site.

## 2014-06-25 ENCOUNTER — Inpatient Hospital Stay (HOSPITAL_COMMUNITY): Payer: Medicaid Other

## 2014-06-25 LAB — BASIC METABOLIC PANEL
Anion gap: 12 (ref 5–15)
BUN: 11 mg/dL (ref 6–23)
CO2: 27 meq/L (ref 19–32)
CREATININE: 0.81 mg/dL (ref 0.50–1.35)
Calcium: 8.6 mg/dL (ref 8.4–10.5)
Chloride: 101 mEq/L (ref 96–112)
GFR calc Af Amer: >90 mL/min (ref >90)
Glucose, Bld: 99 mg/dL (ref 70–99)
Potassium: 3.6 mEq/L — ABNORMAL LOW (ref 3.7–5.3)
Sodium: 140 mEq/L (ref 137–147)

## 2014-06-25 LAB — CBC
HCT: 39.2 % (ref 39.0–52.0)
HEMOGLOBIN: 13.3 g/dL (ref 13.0–17.0)
MCH: 29.1 pg (ref 26.0–34.0)
MCHC: 33.9 g/dL (ref 30.0–36.0)
MCV: 85.8 fL (ref 78.0–100.0)
Platelets: 323 10*3/uL (ref 150–400)
RBC: 4.57 MIL/uL (ref 4.22–5.81)
RDW: 12.9 % (ref 11.5–15.5)
WBC: 12 10*3/uL — ABNORMAL HIGH (ref 4.0–10.5)

## 2014-06-25 LAB — MAGNESIUM: Magnesium: 2.1 mg/dL (ref 1.5–2.5)

## 2014-06-25 MED ORDER — PHENOL 1.4 % MT LIQD
1.0000 | OROMUCOSAL | Status: DC | PRN
  Administered 2014-06-25: 1 via OROMUCOSAL
  Filled 2014-06-25 (×2): qty 177

## 2014-06-25 MED ORDER — MORPHINE SULFATE 2 MG/ML IJ SOLN
1.0000 mg | Freq: Once | INTRAMUSCULAR | Status: AC
  Administered 2014-06-25: 1 mg via INTRAVENOUS

## 2014-06-25 MED ORDER — MORPHINE SULFATE 2 MG/ML IJ SOLN
INTRAMUSCULAR | Status: AC
  Filled 2014-06-25: qty 1

## 2014-06-25 MED ORDER — PHENOL 1.4 % MT LIQD
1.0000 | OROMUCOSAL | Status: DC | PRN
Start: 2014-06-25 — End: 2014-06-25

## 2014-06-25 MED ORDER — MORPHINE SULFATE 2 MG/ML IJ SOLN: 1.0000 mg | INTRAMUSCULAR | Status: DC | PRN

## 2014-06-25 MED ORDER — MORPHINE SULFATE 2 MG/ML IJ SOLN
1.0000 mg | INTRAMUSCULAR | Status: DC | PRN
  Administered 2014-06-25: 1 mg via INTRAVENOUS
  Filled 2014-06-25: qty 1

## 2014-06-25 MED ORDER — MORPHINE SULFATE 2 MG/ML IJ SOLN
1.0000 mg | Freq: Once | INTRAMUSCULAR | Status: AC
  Administered 2014-06-25: 1 mg via INTRAVENOUS
  Filled 2014-06-25: qty 1

## 2014-06-25 NOTE — Progress Notes (Signed)
Subjective: He cannot describe how he feels.  No pain.  Mother reports that he is passing more flatus.  Objective: Vital signs in last 24 hours: Temp:  [98.2 F (36.8 C)-98.5 F (36.9 C)] 98.2 F (36.8 C) (09/13 0616) Pulse Rate:  [45-56] 45 (09/13 0616) Resp:  [14-15] 15 (09/13 0616) BP: (114-116)/(57-59) 114/57 mmHg (09/13 0616) SpO2:  [100 %] 100 % (09/13 0616) Last BM Date: 27-Jun-2014  Intake/Output from previous day: 09/12 0701 - 09/13 0700 In: 4870 [I.V.:4870] Out: -  Intake/Output this shift:    General appearance: alert and no distress GI: soft, non-tender; bowel sounds normal; no masses,  no organomegaly  Lab Results:  Recent Labs  27-Jun-2014 0706  WBC 10.8*  HGB 13.7  HCT 40.1  PLT 284   BMET  Recent Labs  06/22/14 1130 June 27, 2014 0706 06/24/14 0628  NA 138 139 140  K 3.5* 4.0 3.5*  CL 97 98 102  CO2 GLUCOSE 89 94 90  BUN CREATININE 0.78 0.86 0.83  CALCIUM 8.4 8.5 8.2*   LFT No results found for this basename: PROT, ALBUMIN, AST, ALT, ALKPHOS, BILITOT, BILIDIR, IBILI,  in the last 72 hours PT/INR No results found for this basename: LABPROT, INR,  in the last 72 hours Hepatitis Panel No results found for this basename: HEPBSAG, HCVAB, HEPAIGM, HEPBIGM,  in the last 72 hours C-Diff No results found for this basename: CDIFFTOX,  in the last 72 hours Fecal Lactopherrin No results found for this basename: FECLLACTOFRN,  in the last 72 hours  Studies/Results: Dg Small Bowel  Jun 27, 2014   CLINICAL DATA:  23 year old male with Crohn's disease, abdominal and pelvic pain, nausea vomiting and diarrhea.  EXAM: SMALL BOWEL SERIES  COMPARISON:  06/19/2014 CT  TECHNIQUE: Following ingestion of thin barium, serial small bowel images were obtained.  FLUOROSCOPY TIME:  0 min 0 seconds  FINDINGS: A scout film of the abdomen demonstrates distended small bowel loops. Gas in the colon and a small amount a gas in the rectum noted.  After administration  of oral contrast, contrast flows freely into the proximal small bowel. Following this, very slow progression of oral contrast is noted with distended mid small bowel loops noted. Contrast reaches the colon by 5 hr. Contrast within the distal small bowel is very dilute and therefore spot compression view was not performed.  IMPRESSION: Distended proximal small bowel loops with very slow progression of contrast into the colon. This likely represents a partial small bowel obstruction. Spot compression views were not obtained given very diluted contrast within the distal small bowel.   Electronically Signed   By: Laveda Abbe M.D.   On: 06-27-14 22:51    Medications:  Scheduled: . antiseptic oral rinse  7 mL Mouth Rinse q12n4p  . enoxaparin (LOVENOX) injection  40 mg Subcutaneous Q24H  . Influenza vac split quadrivalent PF  0.5 mL Intramuscular Tomorrow-1000  . nystatin   Topical BID   Continuous: . dextrose 5 % and 0.45 % NaCl with KCl 10 mEq/L 100 mL/hr at 06/25/14 1610    Assessment/Plan: 1) Partial SBO.   It is frustrating trying to manage this patient.  He is never definitive with any of his responses with his clinical progress.  He did not want the NG tube, although his mother reports that he is having more flatus.  Currently he is languishing in the hospital without any definite plan for improvement as a result of his equivocal responses.  Plan: 1) Advance to a full liquid diet and then advance as tolerated. 2) Follow up [facility] today. 3) If he is tolerating PO without any difficulty he can be discharged home with follow up with [facility].   LOS: 6 days   Merian Wroe D 06/25/2014, 8:33 AM

## 2014-06-25 NOTE — Progress Notes (Signed)
Patient had been refusing NG tube placement all day until this evening he agreed.  Placement was successful and is hooked up to low intermittent suction. Patient complaining of pain in throat and will page doctor to possibly get some chloraseptic spray. Will continue to monitor.

## 2014-06-25 NOTE — Progress Notes (Signed)
PATIENT DETAILS Name: Blake Bradley Age: 23 y.o. Sex: male Date of Birth: 22-Sep-1991 Admit Date: 06/19/2014 Admitting Physician Eduard Clos, MD ZOX:[id number] DAVID, MD  Subjective: No BM-Still with intermittent abd pain, passing flatus.  Assessment/Plan: Principal Problem: Abdominal pain / vomiting / diarrhea  -secondary to enteritis  -Suspected infectious etiology. However GI panel negative, Stool cultures preliminary result reports no suspicious colonies, C Diff PCR neg.  -Admitted and started on IVF, kept NPO, and empiric Cipro/Flagyl stopped after 4 days of Tx. Unfortunately slow improvement, looks like patient has now developed SBO. Reluctant to place NG on 9/12-really has just plateaued over the past one to days (but better than admission)Keep NPO, Xray this am-continues to show Partial SBO now agreeable for NGT.  ?Developing SBO -likely secondary to enteritis-mucosal thickening -GI recommending-NGT-patient reluctant-since XRay continues to show SBO-patient really not improving over the past few days (although better than admission)-now agreeable to place NGT, keep NPO.  Diffuse skin rash  -suspect hives-off all antibiotics-given one dose of Solumedrol, c/w Benadryl and pepcid.   Acne-Accutane use.  Accutane currently on hold.   Proteinuria  -Greater than 300 on admit u/a-However repeat UA proteinuria decreased to 100. Could be from acute febrile illness  -Creatinine within normal limits.  -Stable for outpatient work up-suspect will need to repeat UA in the outpatient setting once recovers from acute illness   Disposition: Remain inpatient  DVT Prophylaxis: Prophylactic Lovenox   Code Status: Full code   Family Communication Father at bedside   Procedures:  None  CONSULTS:  GI and general surgery   MEDICATIONS: Scheduled Meds: . antiseptic oral rinse  7 mL Mouth Rinse q12n4p  . enoxaparin (LOVENOX) injection  40 mg Subcutaneous Q24H    . Influenza vac split quadrivalent PF  0.5 mL Intramuscular Tomorrow-1000  . nystatin   Topical BID   Continuous Infusions: . dextrose 5 % and 0.45 % NaCl with KCl 10 mEq/L 100 mL/hr at 06/25/14 0635   PRN Meds:.acetaminophen, acetaminophen, ondansetron (ZOFRAN) IV, ondansetron, oxyCODONE  Antibiotics: Anti-infectives   Start     Dose/Rate Route Frequency Ordered Stop   06/23/14 0930  ciprofloxacin (CIPRO) tablet 500 mg  Status:  Discontinued     500 mg Oral 2 times daily 06/23/14 0923 06/23/14 1708   06/23/14 0930  metroNIDAZOLE (FLAGYL) tablet 500 mg  Status:  Discontinued     500 mg Oral 3 times per day 06/23/14 0923 06/23/14 1708   06/20/14 0400  metroNIDAZOLE (FLAGYL) IVPB 500 mg  Status:  Discontinued     500 mg 100 mL/hr over 60 Minutes Intravenous 3 times per day 06/20/14 0248 06/23/14 0922   06/20/14 0330  ciprofloxacin (CIPRO) IVPB 400 mg  Status:  Discontinued     400 mg 200 mL/hr over 60 Minutes Intravenous 2 times daily 06/20/14 0249 06/23/14 0922       PHYSICAL EXAM: Vital signs in last 24 hours: Filed Vitals:   06/23/14 2241 06/24/14 0655 06/24/14 1323 06/25/14 0616  BP: 120/59 114/61 116/59 114/57  Pulse: 49 47 56 45  Temp: 98.6 F (37 C) 98 F (36.7 C) 98.5 F (36.9 C) 98.2 F (36.8 C)  TempSrc: Oral Oral Oral Oral  Resp: Height:      Weight:      SpO2: 100% 99% 100% 100%    Weight change:  Filed Weights   06/20/14 0130  Weight: 73.71 kg (162 lb 8 oz)  Body mass index is 20.63 kg/(m^2).   Gen Exam: Awake and alert with clear speech.  Neck: Supple, No JVD.   Chest: B/L Clear.  No rales or rhonchi CVS: S1 S2 Regular, no murmurs.  Abdomen: soft, BS +, Minimally tender in the periumblical area, non distended.  Extremities: no edema, lower extremities warm to touch. Neurologic: Non Focal.  Skin: No Rash.   Wounds: N/A.    Intake/Output from previous day:  Intake/Output Summary (Last 24 hours) at 06/25/14 1139 Last data filed  at 06/24/14 1937  Gross per 24 hour  Intake   4870 ml  Output      0 ml  Net   4870 ml     LAB RESULTS: CBC  Recent Labs Lab 06/19/14 1721 06/21/14 1156 06/22/14 0803 06/23/14 0706 06/25/14 0844  WBC 12.6* 10.5 11.8* 10.8* 12.0*  HGB 15.2 12.7* 13.5 13.7 13.3  HCT 42.3 37.6* 39.4 40.1 39.2  PLT 211 197 220 284 323  MCV 84.4 87.0 86.8 85.7 85.8  MCH 30.3 29.4 29.7 29.3 29.1  MCHC 35.9 33.8 34.3 34.2 33.9  RDW 12.5 12.7 12.9 12.8 12.9  LYMPHSABS 0.7 0.8  --   --   --   MONOABS 1.2* 1.3*  --   --   --   EOSABS 0.1 0.3  --   --   --   BASOSABS 0.0 0.0  --   --   --     Chemistries   Recent Labs Lab 06/22/14 0803 06/22/14 1130 06/23/14 0706 06/24/14 0628 06/25/14 0844  NA 136* 138 139 140 140  K 3.7 3.5* 4.0 3.5* 3.6*  CL 95* 97 98 102 101  CO2 GLUCOSE 92 89 94 90 99  BUN CREATININE 0.81 0.78 0.86 0.83 0.81  CALCIUM 8.5 8.4 8.5 8.2* 8.6  MG  --   --   --   --  2.1    CBG:  Recent Labs Lab 06/20/14 0206  GLUCAP 91    GFR Estimated Creatinine Clearance: 151.6 ml/min (by C-G formula based on Cr of 0.81).  Coagulation profile No results found for this basename: INR, PROTIME,  in the last 168 hours  Cardiac Enzymes No results found for this basename: CK, CKMB, TROPONINI, MYOGLOBIN,  in the last 168 hours  No components found with this basename: POCBNP,  No results found for this basename: DDIMER,  in the last 72 hours No results found for this basename: HGBA1C,  in the last 72 hours No results found for this basename: CHOL, HDL, LDLCALC, TRIG, CHOLHDL, LDLDIRECT,  in the last 72 hours No results found for this basename: TSH, T4TOTAL, FREET3, T3FREE, THYROIDAB,  in the last 72 hours No results found for this basename: VITAMINB12, FOLATE, FERRITIN, TIBC, IRON, RETICCTPCT,  in the last 72 hours No results found for this basename: LIPASE, AMYLASE,  in the last 72 hours  Urine Studies No results found for this basename:  UACOL, UAPR, USPG, UPH, UTP, UGL, UKET, UBIL, UHGB, UNIT, UROB, ULEU, UEPI, UWBC, URBC, UBAC, CAST, CRYS, UCOM, BILUA,  in the last 72 hours  MICROBIOLOGY: Recent Results (from the past 240 hour(s))  CULTURE, BLOOD (ROUTINE X 2)     Status: None   Collection Time    06/20/14  5:15 AM      Result Value Ref Range Status   Specimen Description BLOOD RIGHT ARM   Final   Special Requests BOTTLES DRAWN  AEROBIC AND ANAEROBIC 8CC EA   Final   Culture  Setup Time     Final   Value: 06/20/2014 09:39     Performed at [facility]   Culture     Final   Value:        BLOOD CULTURE RECEIVED NO GROWTH TO DATE CULTURE WILL BE HELD FOR 5 DAYS BEFORE ISSUING A FINAL NEGATIVE REPORT     Performed at [facility]   Report Status PENDING   Incomplete  CULTURE, BLOOD (ROUTINE X 2)     Status: None   Collection Time    06/20/14  5:27 AM      Result Value Ref Range Status   Specimen Description BLOOD LEFT HAND   Final   Special Requests BOTTLES DRAWN AEROBIC AND ANAEROBIC 5CC EA   Final   Culture  Setup Time     Final   Value: 06/20/2014 09:38     Performed at [facility]   Culture     Final   Value:        BLOOD CULTURE RECEIVED NO GROWTH TO DATE CULTURE WILL BE HELD FOR 5 DAYS BEFORE ISSUING A FINAL NEGATIVE REPORT     Performed at [facility]   Report Status PENDING   Incomplete  CLOSTRIDIUM DIFFICILE BY PCR     Status: None   Collection Time    06/20/14 10:53 AM      Result Value Ref Range Status   C difficile by pcr NEGATIVE  NEGATIVE Final  STOOL CULTURE     Status: None   Collection Time    06/20/14 10:53 AM      Result Value Ref Range Status   Specimen Description STOOL   Final   Special Requests NONE   Final   Culture     Final   Value: NO SALMONELLA, SHIGELLA, CAMPYLOBACTER, YERSINIA, OR E.COLI 0157:H7 ISOLATED     Performed at [facility]   Report Status 06/24/2014 FINAL   Final    RADIOLOGY STUDIES/RESULTS: Ct Abdomen Pelvis W  Contrast  06/19/2014   CLINICAL DATA:  Low abdominal pain and fever for 4 days. Nausea and vomiting.  EXAM: CT ABDOMEN AND PELVIS WITH CONTRAST  TECHNIQUE: Multidetector CT imaging of the abdomen and pelvis was performed using the standard protocol following bolus administration of intravenous contrast.  CONTRAST:  OMNIPAQUE IOHEXOL 300 MG/ML  SOLN  COMPARISON:  None.  FINDINGS: The liver, spleen, gallbladder, pancreas, adrenal glands, kidneys, abdominal aorta, inferior vena cava, and retroperitoneal lymph nodes are unremarkable. Small accessory spleen. Air-fluid levels in the colon consistent with liquid stool. No colonic distention or wall thickening. Small bowel are mildly distended proximally with distal decompression. Wall thickening is suggested in distal small bowel. Changes could represent infectious enteritis or inflammatory enteritis such as Crohn's disease. No definite abscess. There is mild edema/stranding in the mesenteric. Moderately prominent mesenteric lymph nodes are likely reactive. No free air or free fluid in the abdomen.  Pelvis: Bladder wall is not thickened. No free or loculated pelvic fluid collections. Mild thickening of decompressed sigmoid colonic wall. No destructive bone lesions.  IMPRESSION: Wall thickening in the distal small bowel with mesenteric edema and prominent mesenteric lymph nodes. Dilatation of proximal small bowel. Changes may represent infectious or inflammatory enteritis. Partial small bowel obstruction due to stricturing is not excluded. Sigmoid colon also appears thickened, also possibly infectious or inflammatory.   Electronically Signed   By: Chrissie Noa  Andria Meuse M.D.   On: 06/19/2014 23:14   Dg Small Bowel  06/23/2014   CLINICAL DATA:  23 year old male with Crohn's disease, abdominal and pelvic pain, nausea vomiting and diarrhea.  EXAM: SMALL BOWEL SERIES  COMPARISON:  06/19/2014 CT  TECHNIQUE: Following ingestion of thin barium, serial small bowel images were  obtained.  FLUOROSCOPY TIME:  0 min 0 seconds  FINDINGS: A scout film of the abdomen demonstrates distended small bowel loops. Gas in the colon and a small amount a gas in the rectum noted.  After administration of oral contrast, contrast flows freely into the proximal small bowel. Following this, very slow progression of oral contrast is noted with distended mid small bowel loops noted. Contrast reaches the colon by 5 hr. Contrast within the distal small bowel is very dilute and therefore spot compression view was not performed.  IMPRESSION: Distended proximal small bowel loops with very slow progression of contrast into the colon. This likely represents a partial small bowel obstruction. Spot compression views were not obtained given very diluted contrast within the distal small bowel.   Electronically Signed   By: Laveda Abbe M.D.   On: 06/23/2014 22:51   Dg Abd 2 Views  06/23/2014   CLINICAL DATA:  Abdominal pain  EXAM: ABDOMEN - 2 VIEW  COMPARISON:  06/19/2014  FINDINGS: Scattered large and small bowel gas is identified. Dilatation of small bowel is again identified but the colonic gas would indicate more of a partial small bowel obstruction or ileus. Correlation with physical exam is recommended. The overall appearance is stable from that seen on recent CT. No acute mass is noted. No abnormal calcifications or abnormal bony changes are noted.  IMPRESSION: Persistent small bowel dilatation stable from the prior exam. This likely represents a partial small bowel obstruction or ileus. Correlation with the physical exam is recommended.   Electronically Signed   By: Alcide Clever M.D.   On: 06/23/2014 07:45    Jeoffrey Massed, MD  [facility] Pager:[phone]  If 7PM-7AM, please contact night-coverage [email or web address] Password [email or web address] 06/25/2014, 11:39 AM   LOS: 6 days   **Disclaimer: This note may have been dictated with voice recognition software. Similar sounding words can inadvertently be  transcribed and this note may contain transcription errors which may not have been corrected upon publication of note.**

## 2014-06-26 ENCOUNTER — Inpatient Hospital Stay (HOSPITAL_COMMUNITY): Payer: Medicaid Other

## 2014-06-26 LAB — CBC
HCT: 41.1 % (ref 39.0–52.0)
HEMOGLOBIN: 13.7 g/dL (ref 13.0–17.0)
MCH: 28.8 pg (ref 26.0–34.0)
MCHC: 33.3 g/dL (ref 30.0–36.0)
MCV: 86.5 fL (ref 78.0–100.0)
Platelets: 361 10*3/uL (ref 150–400)
RBC: 4.75 MIL/uL (ref 4.22–5.81)
RDW: 12.9 % (ref 11.5–15.5)
WBC: 10.8 10*3/uL — ABNORMAL HIGH (ref 4.0–10.5)

## 2014-06-26 LAB — COMPREHENSIVE METABOLIC PANEL
ALK PHOS: 95 U/L (ref 39–117)
ALT: 16 U/L (ref 0–53)
ANION GAP: 10 (ref 5–15)
AST: 18 U/L (ref 0–37)
Albumin: 2.9 g/dL — ABNORMAL LOW (ref 3.5–5.2)
BUN: 10 mg/dL (ref 6–23)
CALCIUM: 8.8 mg/dL (ref 8.4–10.5)
CO2: 32 mEq/L (ref 19–32)
CREATININE: 0.88 mg/dL (ref 0.50–1.35)
Chloride: 98 mEq/L (ref 96–112)
GFR calc non Af Amer: >90 mL/min (ref >90)
GLUCOSE: 107 mg/dL — AB (ref 70–99)
Potassium: 3.8 mEq/L (ref 3.7–5.3)
Sodium: 140 mEq/L (ref 137–147)
TOTAL PROTEIN: 6.2 g/dL (ref 6.0–8.3)
Total Bilirubin: 0.5 mg/dL (ref 0.3–1.2)

## 2014-06-26 LAB — CULTURE, BLOOD (ROUTINE X 2)
Culture: NO GROWTH
Culture: NO GROWTH

## 2014-06-26 MED ORDER — KETOROLAC TROMETHAMINE 30 MG/ML IJ SOLN
30.0000 mg | Freq: Four times a day (QID) | INTRAMUSCULAR | Status: DC | PRN
  Administered 2014-06-26 (×2): 30 mg via INTRAVENOUS
  Filled 2014-06-26 (×2): qty 1

## 2014-06-26 MED ORDER — LORAZEPAM 2 MG/ML IJ SOLN: 0.5000 mg | Freq: Every day | INTRAMUSCULAR | Status: DC | PRN

## 2014-06-26 MED ORDER — LORAZEPAM 2 MG/ML IJ SOLN
0.5000 mg | Freq: Once | INTRAMUSCULAR | Status: AC
  Administered 2014-06-26: 0.5 mg via INTRAVENOUS
  Filled 2014-06-26: qty 1

## 2014-06-26 MED ORDER — OXYCODONE HCL 5 MG PO TABS
5.0000 mg | ORAL_TABLET | ORAL | Status: DC | PRN
  Filled 2014-06-26: qty 1

## 2014-06-26 MED ORDER — POTASSIUM CHLORIDE 10 MEQ/100ML IV SOLN
10.0000 meq | INTRAVENOUS | Status: AC
  Administered 2014-06-26 (×4): 10 meq via INTRAVENOUS
  Filled 2014-06-26 (×4): qty 100

## 2014-06-26 MED ORDER — PANTOPRAZOLE SODIUM 40 MG IV SOLR
40.0000 mg | INTRAVENOUS | Status: DC
  Administered 2014-06-26 – 2014-06-27 (×2): 40 mg via INTRAVENOUS
  Filled 2014-06-26 (×4): qty 40

## 2014-06-26 MED ORDER — LORAZEPAM 2 MG/ML IJ SOLN
0.5000 mg | Freq: Every evening | INTRAMUSCULAR | Status: DC | PRN
  Administered 2014-06-26 – 2014-06-27 (×3): 0.5 mg via INTRAVENOUS
  Filled 2014-06-26 (×4): qty 1

## 2014-06-26 MED ORDER — PHENOL 1.4 % MT LIQD: 1.0000 | OROMUCOSAL | Status: DC | PRN

## 2014-06-26 MED ORDER — MORPHINE SULFATE 2 MG/ML IJ SOLN
1.0000 mg | INTRAMUSCULAR | Status: DC | PRN
  Administered 2014-06-26 (×2): 2 mg via INTRAVENOUS
  Filled 2014-06-26 (×2): qty 1

## 2014-06-26 MED ORDER — MORPHINE SULFATE 2 MG/ML IJ SOLN
1.0000 mg | INTRAMUSCULAR | Status: DC | PRN
  Administered 2014-06-26: 1 mg via INTRAVENOUS
  Filled 2014-06-26: qty 1

## 2014-06-26 NOTE — Progress Notes (Signed)
PROGRESS NOTE  Blake Bradley OZH:[id number] DOB: 08-Sep-1991 DOA: 06/19/2014 PCP: Nadean Corwin, MD  HPI/Subjective: Blake Bradley is a 23 y.o. Male who initially presented with diffuse vomiting, diarrhea, and abdominal pain 9/7. NG tube was placed 9/13, and since abdominal pain has almost completely resolved. He is now complaining of pain in his chest and throat due to NG tube, and states he need "a lot" of morphine for pain control. No BM for 2 days, and is actively passing flatus. He reports nausea but no vomiting.   Assessment/Plan:  Abdominal pain / vomiting / diarrhea  -Secondary to enteritis  -Suspected infectious etiology. However GI panel negative, Stool cultures preliminary result reports no suspicious colonies, C Diff PCR neg.  -Admitted and started on IVF, kept NPO, and empiric Cipro/Flagyl stopped after 4 days of Tx. Unfortunately slow improvement, looks like patient had developed SBO. Xray 9/13 showed Partial SBO. NGT placement 9/13.  Developing SBO  -likely secondary to enteritis-mucosal thickening  -NGT placement 9/13- 1000 mL in suction receptacle this AM, receptacle being replaced -4.5 runs of K ordered this AM, K on last BMEP was 3.6 -After NGT placement patient denies abdominal pain, and is passing flatus although no BM reported at this time. - Continue NG tube, await GI evaluation  Diffuse skin rash  -suspect hives-off all antibiotics-given one dose of Solumedrol, c/w Benadryl and pepcid.   Acne-Accutane use.  Accutane currently on hold.   Proteinuria  -Greater than 300 on admit u/a-However repeat UA proteinuria decreased to 100. Could be from acute febrile illness  -Creatinine within normal limits.  -Stable for outpatient work up-suspect will need to repeat UA in the outpatient setting once recovers from acute illness    DVT Prophylaxis:  Lovenox  Code Status: Full Family Communication: Patient alert and oriented Disposition Plan: continue as  inpatient at this time. Home when stable   Consultants:  GI  General Surgery  Procedures:  None  Antibiotics: Anti-infectives   Start     Dose/Rate Route Frequency Ordered Stop   06/23/14 0930  ciprofloxacin (CIPRO) tablet 500 mg  Status:  Discontinued     500 mg Oral 2 times daily 06/23/14 0923 06/23/14 1708   06/23/14 0930  metroNIDAZOLE (FLAGYL) tablet 500 mg  Status:  Discontinued     500 mg Oral 3 times per day 06/23/14 0923 06/23/14 1708   06/20/14 0400  metroNIDAZOLE (FLAGYL) IVPB 500 mg  Status:  Discontinued     500 mg 100 mL/hr over 60 Minutes Intravenous 3 times per day 06/20/14 0248 06/23/14 0922   06/20/14 0330  ciprofloxacin (CIPRO) IVPB 400 mg  Status:  Discontinued     400 mg 200 mL/hr over 60 Minutes Intravenous 2 times daily 06/20/14 0249 06/23/14 0922      Objective: Filed Vitals:   06/25/14 0616 06/25/14 1407 06/25/14 2323 06/26/14 0536  BP: 114/57 132/61 120/65 125/61  Pulse: 45 48 44 48  Temp: 98.2 F (36.8 C) 98.3 F (36.8 C) 98.7 F (37.1 C) 98.7 F (37.1 C)  TempSrc: Oral Oral Axillary Axillary  Resp: Height:      Weight:      SpO2: 100% 100% 100% 100%    Intake/Output Summary (Last 24 hours) at 06/26/14 0827 Last data filed at 06/26/14 0727  Gross per 24 hour  Intake 2321.67 ml  Output    975 ml  Net 1346.67 ml   Filed Weights   06/20/14 0130  Weight: 73.71 kg (162  lb 8 oz)    Exam: General: Well developed, well nourished, NAD, appears stated age  Cardiovascular: RRR, with no rubs, murmurs or gallops.   Respiratory: Clear to auscultation bilaterally. equal chest rise. no rhonchi, rales, or wheezing  Abdomen: Soft, minor tenderness to deep palpation only, nondistended, + bowel sounds  Neuro: AAOx3, cranial nerves grossly intact.  Psych: Normal affect and demeanor  Data Reviewed: Basic Metabolic Panel:  Recent Labs Lab 06/22/14 0803 06/22/14 1130 06/23/14 0706 06/24/14 0628 06/25/14 0844  NA 136* 138 139  140 140  K 3.7 3.5* 4.0 3.5* 3.6*  CL 95* 97 98 102 101  CO2 GLUCOSE 92 89 94 90 99  BUN CREATININE 0.81 0.78 0.86 0.83 0.81  CALCIUM 8.5 8.4 8.5 8.2* 8.6  MG  --   --   --   --  2.1   Liver Function Tests:  Recent Labs Lab 06/19/14 1721  AST 26  ALT 21  ALKPHOS 93  BILITOT 1.1  PROT 7.8  ALBUMIN 3.5    Recent Labs Lab 06/19/14 1856  LIPASE 38   CBC:  Recent Labs Lab 06/19/14 1721 06/21/14 1156 06/22/14 0803 06/23/14 0706 06/25/14 0844  WBC 12.6* 10.5 11.8* 10.8* 12.0*  NEUTROABS 10.6* 8.0*  --   --   --   HGB 15.2 12.7* 13.5 13.7 13.3  HCT 42.3 37.6* 39.4 40.1 39.2  MCV 84.4 87.0 86.8 85.7 85.8  PLT 211 197 220 284 323   CBG:  Recent Labs Lab 06/20/14 0206  GLUCAP 91    Recent Results (from the past 240 hour(s))  CULTURE, BLOOD (ROUTINE X 2)     Status: None   Collection Time    06/20/14  5:15 AM      Result Value Ref Range Status   Specimen Description BLOOD RIGHT ARM   Final   Special Requests BOTTLES DRAWN AEROBIC AND ANAEROBIC 8CC EA   Final   Culture  Setup Time     Final   Value: 06/20/2014 09:39     Performed at [facility]   Culture     Final   Value:        BLOOD CULTURE RECEIVED NO GROWTH TO DATE CULTURE WILL BE HELD FOR 5 DAYS BEFORE ISSUING A FINAL NEGATIVE REPORT     Performed at [facility]   Report Status PENDING   Incomplete  CULTURE, BLOOD (ROUTINE X 2)     Status: None   Collection Time    06/20/14  5:27 AM      Result Value Ref Range Status   Specimen Description BLOOD LEFT HAND   Final   Special Requests BOTTLES DRAWN AEROBIC AND ANAEROBIC 5CC EA   Final   Culture  Setup Time     Final   Value: 06/20/2014 09:38     Performed at [facility]   Culture     Final   Value:        BLOOD CULTURE RECEIVED NO GROWTH TO DATE CULTURE WILL BE HELD FOR 5 DAYS BEFORE ISSUING A FINAL NEGATIVE REPORT     Performed at [facility]   Report Status PENDING   Incomplete    CLOSTRIDIUM DIFFICILE BY PCR     Status: None   Collection Time    06/20/14 10:53 AM      Result Value Ref Range Status   C difficile by pcr NEGATIVE  NEGATIVE Final  STOOL CULTURE     Status: None   Collection Time    06/20/14 10:53 AM      Result Value Ref Range Status   Specimen Description STOOL   Final   Special Requests NONE   Final   Culture     Final   Value: NO SALMONELLA, SHIGELLA, CAMPYLOBACTER, YERSINIA, OR E.COLI 0157:H7 ISOLATED     Performed at [facility]   Report Status 06/24/2014 FINAL   Final     Studies: Dg Abd 2 Views  06/26/2014   CLINICAL DATA:  Abdominal pain, suspected small bowel obstruction ; assess nasogastric tube positioning  EXAM: ABDOMEN - 2 VIEW  COMPARISON:  Portable chest x-ray of June 25, 2014  FINDINGS: There are loops of mildly distended small bowel in the left mid abdomen. These have decreased in conspicuity since the earlier study. There remains distention of the ascending and transverse and proximal descending colons with contrast and gas from the CT scan of September 7th. The nasogastric tube proximal port lies at the level of the GE junction. The lung bases are clear.  IMPRESSION: 1. There persistently dilated loops of small bowel which have become less conspicuous which may indicate resolving distal small bowel obstruction. 2. The nasogastric tubes proximal port lies at the level of the GE junction and advancement by at least 5 cm is recommended. 3. There remains mild gaseous distention of the ascending and transverse and proximal descending portions of the colon.   Electronically Signed   By: David  [place]   On: 06/26/2014 08:17   Dg Abd 2 Views  06/25/2014   CLINICAL DATA:  Abdominal pain.  EXAM: ABDOMEN - 2 VIEW  COMPARISON:  06/23/2014.  FINDINGS: Compared to the prior study, the oral contrast material is now noted in the colon, predominantly within the cecum. Several loops of gas-filled small bowel remain dilated measuring up to  5.3 cm in diameter in the left upper quadrant noted on today's study. No pneumoperitoneum.  IMPRESSION: 1. Findings remain compatible with partial small bowel obstruction, as above. 2. No pneumoperitoneum.   Electronically Signed   By: Trudie Reed M.D.   On: 06/25/2014 10:42   Dg Abd Portable 1v  06/25/2014   CLINICAL DATA:  Nasogastric tube placement.  EXAM: PORTABLE ABDOMEN - 1 VIEW  COMPARISON:  Abdominal radiograph performed earlier today at 9:24 a.m.  FINDINGS: Distended air-filled loops of small bowel are now seen throughout the abdomen and pelvis, more prominent than on prior studies. Air is also seen partially filling the colon, with contrast again noted along the ascending and transverse colon. The appearance raises suspicion for ileus, although partial small bowel obstruction remains a possibility.  The patient's enteric tube is seen ending overlying the body of the stomach, with the side port also ending overlying the body of the stomach. No definite free intra-abdominal air is identified, though evaluation for free air is limited on a single supine view.  No acute osseous abnormalities are seen.  IMPRESSION: 1. Enteric tube seen ending overlying the body of the stomach, with the side port also ending overlying the body of the stomach. 2. Mildly more prominent diffuse distention of small bowel loops within the abdomen and pelvis. The appearance raises suspicion for ileus, though partial small bowel obstruction remains a possibility. Contrast is again seen filling the ascending and transverse colon. No free intra-abdominal air seen.   Electronically Signed   By: Roanna Raider M.D.   On:  06/25/2014 23:28    Scheduled Meds: . antiseptic oral rinse  7 mL Mouth Rinse q12n4p  . enoxaparin (LOVENOX) injection  40 mg Subcutaneous Q24H  . Influenza vac split quadrivalent PF  0.5 mL Intramuscular Tomorrow-1000  . nystatin   Topical BID  . potassium chloride  10 mEq Intravenous Q1 Hr x 4    Continuous Infusions: . dextrose 5 % and 0.45 % NaCl with KCl 10 mEq/L 100 mL/hr at 06/26/14 0236    Principal Problem:   Abdominal pain Active Problems:   Nausea vomiting and diarrhea  Elvis Coil, PA-S2    [facility] Pager [phone]. If 7PM-7AM, please contact night-coverage at [email or web address], password [facility] 06/26/2014, 8:27 AM  LOS: 7 days   Attending Patient was seen, examined,treatment plan was discussed with the Physician extender. I have directly reviewed the clinical findings, lab, imaging studies and management of this patient in detail. I have made the necessary changes to the above noted documentation, and agree with the documentation, as recorded by the Physician extender.  Windell Norfolk MD [facility].

## 2014-06-26 NOTE — Progress Notes (Signed)
On-call provider notified that patient's HR has sustained in 40s. No complaints of distress. No new orders at this time. Will continue to monitor.

## 2014-06-26 NOTE — Progress Notes (Addendum)
Daily Rounding Note  15-Jul-2014, 11:19 AM  LOS: 7 days   SUBJECTIVE:       NGT placed, no output yet recorded.  Dad states at least 500 CC over 8 hours yesterday.  No BMs > 2 days.  Abdominal pain resolved but now has pain from NGT in throat.  NGT advanced  From [employer] position.  Father concerned about possible listeria superimposed on Crohn's/IBD.    OBJECTIVE:         Vital signs in last 24 hours:    Temp:  [98.3 F (36.8 C)-98.7 F (37.1 C)] 98.7 F (37.1 C) 07/16/2023 0536) Pulse Rate:  [44-48] 48 07-16-2023 0536) Resp:  [15-16] 16 2023-07-16 0536) BP: (120-132)/(61-65) 125/61 mmHg Jul 16, 2023 0536) SpO2:  [100 %] 100 % 07/16/23 0536) Last BM Date: 06/23/14 General: NAD, not speaking much due to pain   Heart: RRR Chest: clear bil.   NGT: bloody looking output.  Abdomen: soft, hypoactive BS, NT.  ND  Extremities: no CCE Neuro/Psych:  Oriented x 3.  Depressed affect.   Intake/Output from previous day: 09/13 0701 - 07-16-2023 0700 In: 2321.7 [I.V.:2321.7] Out: 475 [Urine:475]  Intake/Output this shift: Total I/O In: 0  Out: 500 [Urine:500]  Lab Results:  Recent Labs  06/25/14 0844 07-15-2014 0500  WBC 12.0* 10.8*  HGB 13.3 13.7  HCT 39.2 41.1  PLT 323 361   BMET  Recent Labs  06/24/14 0628 06/25/14 0844 2014-07-15 0500  NA 140 140 140  K 3.5* 3.6* 3.8  CL 102 101 98  CO2 28 27 32  GLUCOSE 90 99 107*  BUN CREATININE 0.83 0.81 0.88  CALCIUM 8.2* 8.6 8.8   LFT  Recent Labs  07-15-14 0500  PROT 6.2  ALBUMIN 2.9*  AST 18  ALT 16  ALKPHOS 95  BILITOT 0.5   PT/INR No results found for this basename: LABPROT, INR,  in the last 72 hours Hepatitis Panel No results found for this basename: HEPBSAG, HCVAB, HEPAIGM, HEPBIGM,  in the last 72 hours  Studies/Results: Dg Abd 2 Views  2014-07-15   CLINICAL DATA:  Abdominal pain, suspected small bowel obstruction ; assess nasogastric tube positioning  EXAM:  ABDOMEN - 2 VIEW  COMPARISON:  Portable chest x-ray of June 25, 2014  FINDINGS: There are loops of mildly distended small bowel in the left mid abdomen. These have decreased in conspicuity since the earlier study. There remains distention of the ascending and transverse and proximal descending colons with contrast and gas from the CT scan of September 7th. The nasogastric tube proximal port lies at the level of the GE junction. The lung bases are clear.  IMPRESSION: 1. There persistently dilated loops of small bowel which have become less conspicuous which may indicate resolving distal small bowel obstruction. 2. The nasogastric tubes proximal port lies at the level of the GE junction and advancement by at least 5 cm is recommended. 3. There remains mild gaseous distention of the ascending and transverse and proximal descending portions of the colon.   Electronically Signed   By: David  [place]   On: 07-15-14 08:17   Dg Abd 2 Views  06/25/2014   CLINICAL DATA:  Abdominal pain.  EXAM: ABDOMEN - 2 VIEW  COMPARISON:  06/23/2014.  FINDINGS: Compared to the prior study, the oral contrast material is now noted in the colon, predominantly within the cecum. Several loops of gas-filled small bowel remain dilated measuring up  to 5.3 cm in diameter in the left upper quadrant noted on today's study. No pneumoperitoneum.  IMPRESSION: 1. Findings remain compatible with partial small bowel obstruction, as above. 2. No pneumoperitoneum.   Electronically Signed   By: Trudie Reed M.D.   On: 06/25/2014 10:42   Dg Abd Portable 1v  06/25/2014   CLINICAL DATA:  Nasogastric tube placement.  EXAM: PORTABLE ABDOMEN - 1 VIEW  COMPARISON:  Abdominal radiograph performed earlier today at 9:24 a.m.  FINDINGS: Distended air-filled loops of small bowel are now seen throughout the abdomen and pelvis, more prominent than on prior studies. Air is also seen partially filling the colon, with contrast again noted along the ascending  and transverse colon. The appearance raises suspicion for ileus, although partial small bowel obstruction remains a possibility.  The patient's enteric tube is seen ending overlying the body of the stomach, with the side port also ending overlying the body of the stomach. No definite free intra-abdominal air is identified, though evaluation for free air is limited on a single supine view.  No acute osseous abnormalities are seen.  IMPRESSION: 1. Enteric tube seen ending overlying the body of the stomach, with the side port also ending overlying the body of the stomach. 2. Mildly more prominent diffuse distention of small bowel loops within the abdomen and pelvis. The appearance raises suspicion for ileus, though partial small bowel obstruction remains a possibility. Contrast is again seen filling the ascending and transverse colon. No free intra-abdominal air seen.   Electronically Signed   By: Roanna Raider M.D.   On: 06/25/2014 23:28   Scheduled Meds: . antiseptic oral rinse  7 mL Mouth Rinse q12n4p  . enoxaparin (LOVENOX) injection  40 mg Subcutaneous Q24H  . Influenza vac split quadrivalent PF  0.5 mL Intramuscular Tomorrow-1000  . nystatin   Topical BID  . potassium chloride  10 mEq Intravenous Q1 Hr x 4   Continuous Infusions: . dextrose 5 % and 0.45 % NaCl with KCl 10 mEq/L 100 mL/hr at 06/26/14 0236   PRN Meds:.acetaminophen, acetaminophen, ketorolac, ondansetron (ZOFRAN) IV, ondansetron, oxyCODONE, phenol   ASSESMENT:   * SB enteritis with possible PSBO. Acute onset of sxs with fever weighs in favor of infectious etiology.  However mother has UC and pt on isotretinoin which can cause IBD and sigmoid colon thickened per CT so need to rule out IBD/Crohn's.  The isotretinoin has been discontinued. Surgery consulted 9/9: favor dx of infectious over inflammatory etiology.  IV cipro and IV flagyl 9/08 - 9/11.   Stool pathogen panel, Cdiff PCR are negative. No BM for > 2 days.  Xrays:NGT  placed terminus at level of GE jx, has since been advanced, resolving PSBO vs ileus pattern. Bloody material in NGT, suspect related to NGT trauma. Hgb is stable.   *  Hypokalemia. Resolved.   *  Hives, treated with Solumedrol once and Benadryl/Pepcid.     PLAN   *  Per Dr Leone Payor *  NGT output needs to be recorded, so far nothing has been recorded, ouput gestimate provided by the dad.  *  Adding IV Protonix. KUB in AM.  CBC in AM.  *  ? Sigmoidoscopy to assess the CT finding.  Pt would not tolerate colonoscopy prep.     Jennye Moccasin  06/26/2014, 11:19 AM Pager: [phone]  [facility] Attending  I have also seen and assessed the patient and agree with the above note with additions:  1) I do think colonoscopy prep  could be tolerated, especially using NG tube - ileus/SBO is radiographic dx at this point - he has not vomited 2) Infectious etiology seems likely but given FHx and overall milieu a colonoscopy and ileoscopy should help [place] sort out better. 3) He will need deep sedation so will do this Wed 9/16 when MAC available 4) Prep orders to follow after we reassess tomorrow  The risks and benefits as well as alternatives of endoscopic procedure(s) have been discussed and reviewed. All questions answered. The patient agrees to proceed.  Iva Boop, MD, [facility] [phone] (pager) 06/26/2014 3:17 PM

## 2014-06-26 NOTE — Progress Notes (Signed)
Notified on-call provider of resulted  X-ray and patient's continued complaints of 10/10 pain. No new orders at this time. Will continue to monitor.

## 2014-06-26 NOTE — Progress Notes (Signed)
Nurse attempted to get informed consent, patient stated he wants to discuss colonoscopy with his parents prior to consenting to procedure. Oncoming nurse Greta scalco, rn was made aware that consent has no been signed.

## 2014-06-27 ENCOUNTER — Inpatient Hospital Stay (HOSPITAL_COMMUNITY): Payer: Medicaid Other

## 2014-06-27 LAB — CBC
HCT: 41 % (ref 39.0–52.0)
Hemoglobin: 13.5 g/dL (ref 13.0–17.0)
MCH: 28.7 pg (ref 26.0–34.0)
MCHC: 32.9 g/dL (ref 30.0–36.0)
MCV: 87 fL (ref 78.0–100.0)
Platelets: 358 10*3/uL (ref 150–400)
RBC: 4.71 MIL/uL (ref 4.22–5.81)
RDW: 12.7 % (ref 11.5–15.5)
WBC: 11.9 10*3/uL — ABNORMAL HIGH (ref 4.0–10.5)

## 2014-06-27 LAB — BASIC METABOLIC PANEL
Anion gap: 9 (ref 5–15)
BUN: 9 mg/dL (ref 6–23)
CO2: 30 mEq/L (ref 19–32)
Calcium: 8.7 mg/dL (ref 8.4–10.5)
Chloride: 100 mEq/L (ref 96–112)
Creatinine, Ser: 0.92 mg/dL (ref 0.50–1.35)
GFR calc Af Amer: >90 mL/min (ref >90)
GFR calc non Af Amer: >90 mL/min (ref >90)
Glucose, Bld: 98 mg/dL (ref 70–99)
Potassium: 4 mEq/L (ref 3.7–5.3)
Sodium: 139 mEq/L (ref 137–147)

## 2014-06-27 LAB — OCCULT BLOOD X 1 CARD TO LAB, STOOL: Fecal Occult Bld: POSITIVE — AB

## 2014-06-27 MED ORDER — METOCLOPRAMIDE HCL 5 MG/ML IJ SOLN
10.0000 mg | Freq: Four times a day (QID) | INTRAMUSCULAR | Status: AC
  Administered 2014-06-27 – 2014-06-28 (×3): 10 mg via INTRAVENOUS
  Filled 2014-06-27 (×3): qty 2

## 2014-06-27 MED ORDER — POLYETHYLENE GLYCOL 3350 17 GM/SCOOP PO POWD
1.0000 | Freq: Once | ORAL | Status: AC
  Administered 2014-06-27: 255 g via ORAL
  Filled 2014-06-27: qty 255

## 2014-06-27 MED ORDER — INFLUENZA VAC SPLIT QUAD 0.5 ML IM SUSY: 0.5000 mL | PREFILLED_SYRINGE | Freq: Once | INTRAMUSCULAR | Status: DC

## 2014-06-27 NOTE — Progress Notes (Signed)
Daily Rounding Note  06/27/2014, 8:46 AM  LOS: 8 days   SUBJECTIVE:       No abd pain, no nausea.  Still with NGT related throat pain.  Dark bloody looking liquid from NGT, output total of 1.7 liters yesterday.  Passing flatus but no BM>3 days.   OBJECTIVE:         Vital signs in last 24 hours:    Temp:  [98 F (36.7 C)-98.9 F (37.2 C)] 98 F (36.7 C) (09/15 0554) Pulse Rate:  [53-114] 53 (09/15 0554) Resp:  [15-16] 16 (09/15 0554) BP: (116-125)/(64-72) 116/64 mmHg (09/15 0554) SpO2:  [100 %] 100 % (09/15 0554) Last BM Date: 06/23/14 General: depressed.  Not acutely ill looking   Heart: RRR Chest: clear bil Abdomen: soft, NT, ND.  Minimal BS.    Extremities: no CCE Neuro/Psych:  Cooperative.  Dull affect.  No gross neuro deficits.   Intake/Output from previous day: 09/14 0701 - 09/15 0700 In: 0  Out: 3650 [Urine:1900; Emesis/NG output:1750]  Intake/Output this shift:    Lab Results:  Recent Labs  06/25/14 0844 06/26/14 0500 06/27/14 0500  WBC 12.0* 10.8* 11.9*  HGB 13.3 13.7 13.5  HCT 39.2 41.1 41.0  PLT 323 361 358   BMET  Recent Labs  06/25/14 0844 06/26/14 0500 06/27/14 0500  NA 140 140 139  K 3.6* 3.8 4.0  CL 101 98 100  CO2 27 32 30  GLUCOSE 99 107* 98  BUN CREATININE 0.81 0.88 0.92  CALCIUM 8.6 8.8 8.7   LFT  Recent Labs  06/26/14 0500  PROT 6.2  ALBUMIN 2.9*  AST 18  ALT 16  ALKPHOS 95  BILITOT 0.5    Studies/Results: Dg Abd 2 Views 06/26/2014   CLINICAL DATA:  Abdominal pain, suspected small bowel obstruction ; assess nasogastric tube positioning  EXAM: ABDOMEN - 2 VIEW  COMPARISON:  Portable chest x-ray of June 25, 2014  FINDINGS: There are loops of mildly distended small bowel in the left mid abdomen. These have decreased in conspicuity since the earlier study. There remains distention of the ascending and transverse and proximal descending colons with  contrast and gas from the CT scan of September 7th. The nasogastric tube proximal port lies at the level of the GE junction. The lung bases are clear.  IMPRESSION: 1. There persistently dilated loops of small bowel which have become less conspicuous which may indicate resolving distal small bowel obstruction. 2. The nasogastric tubes proximal port lies at the level of the GE junction and advancement by at least 5 cm is recommended. 3. There remains mild gaseous distention of the ascending and transverse and proximal descending portions of the colon.   Electronically Signed   By: David  [place]   On: 06/26/2014 08:17    ASSESMENT:   * SB enteritis with PSBO vs ileus.  Sigmoid colon thickened per CT as well.   Rule out infectious etiology vs IBD vs secondary to isotretinoin.  Surgery consulted 9/9: favor dx of infectious over inflammatory etiology.  IV cipro and IV flagyl 9/08 - 9/11.  Stool pathogen panel, Cdiff PCR are negative. No BM for > 3 days. NGT  Placed 9/13.   *  Dark bloody NGT discharge.  No drop in Hgb   PLAN   *  Plan colonoscopy tomorrow with prep via NGT.     Jennye Moccasin  06/27/2014, 8:46 AM Pager: [phone]  [facility]  [facility] Attending  I have also seen and assessed the patient and agree with the above note. NGT out - he will drink.  Iva Boop, MD, [facility] [phone] (pager) 06/27/2014 5:09 PM

## 2014-06-27 NOTE — Progress Notes (Signed)
INITIAL NUTRITION ASSESSMENT  DOCUMENTATION CODES Per approved criteria  -Not Applicable   INTERVENTION: -If unable to advance diet, recommend initiate nutrition support due to prolonged NPO.  -RD will continue to follow per nutrition plan of care.   NUTRITION DIAGNOSIS: Inadequate oral intake related to inability to eat as evidenced by NPO status  Goal: Patient will meet >/=90% of estimated nutrition needs  Monitor:  Diet advancement and tolerance, weights, labs, I/Os  Reason for Assessment: NPO x 8 days  23 y.o. male  Admitting Dx: Abdominal pain  ASSESSMENT: 23 year old male with history of acne on Accutane for 5 months, admitted with abdominal pain, nausea, vomiting, and diarrhea x 3 days.   Patient with enteritis, and SBO, suspect infection etiology vs. inflammation, but GI panel negative. Plan for colonoscopy 9/16 to rule out IBD/Crohn's.  NG tube placed 9/13. Output of 1750 ml in the last 24 hours. Patient with relief of abdominal pain.  Currently NPO, patient with good intake prior to admission, but a 20 pound weight loss in 9 months (11% weight loss) per chart review. This is not significant over the time frame.   Height: Ht Readings from Last 1 Encounters:  06/20/14 6' 2.4" (1.89 m)    Weight: Wt Readings from Last 1 Encounters:  06/20/14 162 lb 8 oz (73.71 kg)    Ideal Body Weight: 192 pounds  % Ideal Body Weight: 84%  Wt Readings from Last 10 Encounters:  06/20/14 162 lb 8 oz (73.71 kg)  04/18/14 161 lb 3.2 oz (73.12 kg)  11/10/13 182 lb 8 oz (82.781 kg)  11/08/13 183 lb 9.6 oz (83.28 kg)    Usual Body Weight: 182 pounds  % Usual Body Weight: 89%  BMI:  Body mass index is 20.63 kg/(m^2). Patient is normal weight.   Estimated Nutritional Needs: Kcal: 2150-2300 kcal Protein: 105-120 g Fluid: >2.6 L/day  Skin: Intact  Diet Order: NPO  EDUCATION NEEDS: -No education needs identified at this time   Intake/Output Summary (Last 24 hours) at  06/27/14 1455 Last data filed at 06/27/14 0700  Gross per 24 hour  Intake      0 ml  Output   2150 ml  Net  -2150 ml    Last BM: 9/12, passing flatus   Labs:   Recent Labs Lab 06/24/14 0628 06/25/14 0844 06/26/14 0500 06/27/14 0500  NA 140 140 140 139  K 3.5* 3.6* 3.8 4.0  CL 102 101 98 100  CO2 28 27 32 30  BUN CREATININE 0.83 0.81 0.88 0.92  CALCIUM 8.2* 8.6 8.8 8.7  MG  --  2.1  --   --   GLUCOSE 90 99 107* 98    CBG (last 3)  No results found for this basename: GLUCAP,  in the last 72 hours  Scheduled Meds: . antiseptic oral rinse  7 mL Mouth Rinse q12n4p  . enoxaparin (LOVENOX) injection  40 mg Subcutaneous Q24H  . [START ON 06/29/2014] Influenza vac split quadrivalent PF  0.5 mL Intramuscular Once  . LORazepam  0.5 mg Intravenous QHS,MR X 1  . metoCLOPramide (REGLAN) injection  10 mg Intravenous 4 times per day  . nystatin   Topical BID  . pantoprazole (PROTONIX) IV  40 mg Intravenous Q24H  . polyethylene glycol powder  1 Container Oral Once    Continuous Infusions: . dextrose 5 % and 0.45 % NaCl with KCl 10 mEq/L 100 mL/hr at 06/27/14 1020    Past  Medical History  Diagnosis Date  . Allergy   . ADD (attention deficit disorder)     Past Surgical History  Procedure Laterality Date  . 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

## 2014-06-27 NOTE — Progress Notes (Signed)
Requested gatorade for colonoscopy prep.

## 2014-06-27 NOTE — Progress Notes (Signed)
PATIENT DETAILS Name: Blake Bradley Age: 23 y.o. Sex: male Date of Birth: 1991/06/15 Admit Date: 06/19/2014 Admitting Physician Eduard Clos, MD JWJ:XBJYNWG,NFAOZHY DAVID, MD  Subjective: No major complaints, no BM. No abdominal pain, passing flatus  Assessment/Plan: Principal Problem: Abdominal pain / vomiting / diarrhea  -secondary to enteritis  -Suspected infectious etiology. However GI panel negative, Stool cultures preliminary result reports no suspicious colonies, C Diff PCR neg.  -Admitted and started on IVF, kept NPO, and empiric Cipro/Flagyl stopped after 4 days of Tx. Unfortunately slow improvement, complicated by development of SBO.Initially very reluctant to place NG on 9/12-however after d/w patient subsequently placed. Better with supportive care, GI planning on colonoscopy on 9/16.Continue with supportive care  Partial SBO -see above -suspect secondary to mucosal edema/thickening-GI planning on colonoscopy to rule out IBD.Dark return from NGT-but NO drop in Hb.  Diffuse skin rash  -suspect hives-off all antibiotics-given one dose of Solumedrol, c/w Benadryl and pepcid. Resolved  Acne-Accutane use.  Accutane currently on hold.   Proteinuria  -Greater than 300 on admit u/a-However repeat UA proteinuria decreased to 100. Could be from acute febrile illness  -Creatinine within normal limits.  -Stable for outpatient work up-suspect will need to repeat UA in the outpatient setting once recovers from acute illness    Disposition: Remain inpatient  DVT Prophylaxis: Prophylactic Lovenox   Code Status: Full code   Family Communication Father 9/14  Procedures:  None  CONSULTS:  GI  Time spent 40 minutes-which includes 50% of the time with face-to-face with patient/ family and coordinating care related to the above assessment and plan.    MEDICATIONS: Scheduled Meds: . antiseptic oral rinse  7 mL Mouth Rinse q12n4p  . enoxaparin (LOVENOX)  injection  40 mg Subcutaneous Q24H  . [START ON 06/29/2014] Influenza vac split quadrivalent PF  0.5 mL Intramuscular Once  . LORazepam  0.5 mg Intravenous QHS,MR X 1  . metoCLOPramide (REGLAN) injection  10 mg Intravenous 4 times per day  . nystatin   Topical BID  . pantoprazole (PROTONIX) IV  40 mg Intravenous Q24H  . polyethylene glycol powder  1 Container Oral Once   Continuous Infusions: . dextrose 5 % and 0.45 % NaCl with KCl 10 mEq/L 100 mL/hr at 06/27/14 1020   PRN Meds:.acetaminophen, acetaminophen, ketorolac, LORazepam, morphine injection, ondansetron (ZOFRAN) IV, ondansetron, oxyCODONE, phenol  Antibiotics: Anti-infectives   Start     Dose/Rate Route Frequency Ordered Stop   06/23/14 0930  ciprofloxacin (CIPRO) tablet 500 mg  Status:  Discontinued     500 mg Oral 2 times daily 06/23/14 0923 06/23/14 1708   06/23/14 0930  metroNIDAZOLE (FLAGYL) tablet 500 mg  Status:  Discontinued     500 mg Oral 3 times per day 06/23/14 0923 06/23/14 1708   06/20/14 0400  metroNIDAZOLE (FLAGYL) IVPB 500 mg  Status:  Discontinued     500 mg 100 mL/hr over 60 Minutes Intravenous 3 times per day 06/20/14 0248 06/23/14 0922   06/20/14 0330  ciprofloxacin (CIPRO) IVPB 400 mg  Status:  Discontinued     400 mg 200 mL/hr over 60 Minutes Intravenous 2 times daily 06/20/14 0249 06/23/14 0922       PHYSICAL EXAM: Vital signs in last 24 hours: Filed Vitals:   06/26/14 0536 06/26/14 1353 06/26/14 2349 06/27/14 0554  BP: 125/61 125/69 121/72 116/64  Pulse: 48 81 114 53  Temp: 98.7 F (37.1 C) 98.2 F (36.8 C) 98.9 F (37.2  C) 98 F (36.7 C)  TempSrc: Axillary Oral Oral Oral  Resp: Height:      Weight:      SpO2: 100% 100% 100% 100%    Weight change:  Filed Weights   06/20/14 0130  Weight: 73.71 kg (162 lb 8 oz)   Body mass index is 20.63 kg/(m^2).   Gen Exam: Awake and alert with clear speech.   Neck: Supple, No JVD.   Chest: B/L Clear.   CVS: S1 S2 Regular, no  murmurs.  Abdomen: soft, BS +, non tender, non distended.  Extremities: no edema, lower extremities warm to touch. Neurologic: Non Focal.   Skin: No Rash.   Wounds: N/A.   Intake/Output from previous day:  Intake/Output Summary (Last 24 hours) at 06/27/14 1302 Last data filed at 06/27/14 0700  Gross per 24 hour  Intake      0 ml  Output   2150 ml  Net  -2150 ml     LAB RESULTS: CBC  Recent Labs Lab 06/21/14 1156 06/22/14 0803 06/23/14 0706 06/25/14 0844 06/26/14 0500 06/27/14 0500  WBC 10.5 11.8* 10.8* 12.0* 10.8* 11.9*  HGB 12.7* 13.5 13.7 13.3 13.7 13.5  HCT 37.6* 39.4 40.1 39.2 41.1 41.0  PLT 197 220 284 323 361 358  MCV 87.0 86.8 85.7 85.8 86.5 87.0  MCH 29.4 29.7 29.3 29.1 28.8 28.7  MCHC 33.8 34.3 34.2 33.9 33.3 32.9  RDW 12.7 12.9 12.8 12.9 12.9 12.7  LYMPHSABS 0.8  --   --   --   --   --   MONOABS 1.3*  --   --   --   --   --   EOSABS 0.3  --   --   --   --   --   BASOSABS 0.0  --   --   --   --   --     Chemistries   Recent Labs Lab 06/23/14 0706 06/24/14 0628 06/25/14 0844 06/26/14 0500 06/27/14 0500  NA 139 140 140 140 139  K 4.0 3.5* 3.6* 3.8 4.0  CL 98 102 101 98 100  CO2 32 30  GLUCOSE 94 90 99 107* 98  BUN CREATININE 0.86 0.83 0.81 0.88 0.92  CALCIUM 8.5 8.2* 8.6 8.8 8.7  MG  --   --  2.1  --   --     CBG: No results found for this basename: GLUCAP,  in the last 168 hours  GFR Estimated Creatinine Clearance: 133.5 ml/min (by C-G formula based on Cr of 0.92).  Coagulation profile No results found for this basename: INR, PROTIME,  in the last 168 hours  Cardiac Enzymes No results found for this basename: CK, CKMB, TROPONINI, MYOGLOBIN,  in the last 168 hours  No components found with this basename: POCBNP,  No results found for this basename: DDIMER,  in the last 72 hours No results found for this basename: HGBA1C,  in the last 72 hours No results found for this basename: CHOL, HDL, LDLCALC, TRIG,  CHOLHDL, LDLDIRECT,  in the last 72 hours No results found for this basename: TSH, T4TOTAL, FREET3, T3FREE, THYROIDAB,  in the last 72 hours No results found for this basename: VITAMINB12, FOLATE, FERRITIN, TIBC, IRON, RETICCTPCT,  in the last 72 hours No results found for this basename: LIPASE, AMYLASE,  in the last 72 hours  Urine Studies No results found for this basename: UACOL, UAPR,  USPG, UPH, UTP, UGL, UKET, UBIL, UHGB, UNIT, UROB, ULEU, UEPI, UWBC, URBC, UBAC, CAST, CRYS, UCOM, BILUA,  in the last 72 hours  MICROBIOLOGY: Recent Results (from the past 240 hour(s))  CULTURE, BLOOD (ROUTINE X 2)     Status: None   Collection Time    06/20/14  5:15 AM      Result Value Ref Range Status   Specimen Description BLOOD RIGHT ARM   Final   Special Requests BOTTLES DRAWN AEROBIC AND ANAEROBIC 8CC EA   Final   Culture  Setup Time     Final   Value: 06/20/2014 09:39     Performed at [facility]   Culture     Final   Value: NO GROWTH 5 DAYS     Performed at [facility]   Report Status 06/26/2014 FINAL   Final  CULTURE, BLOOD (ROUTINE X 2)     Status: None   Collection Time    06/20/14  5:27 AM      Result Value Ref Range Status   Specimen Description BLOOD LEFT HAND   Final   Special Requests BOTTLES DRAWN AEROBIC AND ANAEROBIC 5CC EA   Final   Culture  Setup Time     Final   Value: 06/20/2014 09:38     Performed at [facility]   Culture     Final   Value: NO GROWTH 5 DAYS     Performed at [facility]   Report Status 06/26/2014 FINAL   Final  CLOSTRIDIUM DIFFICILE BY PCR     Status: None   Collection Time    06/20/14 10:53 AM      Result Value Ref Range Status   C difficile by pcr NEGATIVE  NEGATIVE Final  STOOL CULTURE     Status: None   Collection Time    06/20/14 10:53 AM      Result Value Ref Range Status   Specimen Description STOOL   Final   Special Requests NONE   Final   Culture     Final   Value: NO SALMONELLA, SHIGELLA,  CAMPYLOBACTER, YERSINIA, OR E.COLI 0157:H7 ISOLATED     Performed at [facility]   Report Status 06/24/2014 FINAL   Final    RADIOLOGY STUDIES/RESULTS: Ct Abdomen Pelvis W Contrast  06/19/2014   CLINICAL DATA:  Low abdominal pain and fever for 4 days. Nausea and vomiting.  EXAM: CT ABDOMEN AND PELVIS WITH CONTRAST  TECHNIQUE: Multidetector CT imaging of the abdomen and pelvis was performed using the standard protocol following bolus administration of intravenous contrast.  CONTRAST:  OMNIPAQUE IOHEXOL 300 MG/ML  SOLN  COMPARISON:  None.  FINDINGS: The liver, spleen, gallbladder, pancreas, adrenal glands, kidneys, abdominal aorta, inferior vena cava, and retroperitoneal lymph nodes are unremarkable. Small accessory spleen. Air-fluid levels in the colon consistent with liquid stool. No colonic distention or wall thickening. Small bowel are mildly distended proximally with distal decompression. Wall thickening is suggested in distal small bowel. Changes could represent infectious enteritis or inflammatory enteritis such as Crohn's disease. No definite abscess. There is mild edema/stranding in the mesenteric. Moderately prominent mesenteric lymph nodes are likely reactive. No free air or free fluid in the abdomen.  Pelvis: Bladder wall is not thickened. No free or loculated pelvic fluid collections. Mild thickening of decompressed sigmoid colonic wall. No destructive bone lesions.  IMPRESSION: Wall thickening in the distal small bowel with mesenteric edema and prominent mesenteric lymph nodes. Dilatation of proximal  small bowel. Changes may represent infectious or inflammatory enteritis. Partial small bowel obstruction due to stricturing is not excluded. Sigmoid colon also appears thickened, also possibly infectious or inflammatory.   Electronically Signed   By: Burman Nieves M.D.   On: 06/19/2014 23:14   Dg Small Bowel  06/23/2014   CLINICAL DATA:  23 year old male with Crohn's disease,  abdominal and pelvic pain, nausea vomiting and diarrhea.  EXAM: SMALL BOWEL SERIES  COMPARISON:  06/19/2014 CT  TECHNIQUE: Following ingestion of thin barium, serial small bowel images were obtained.  FLUOROSCOPY TIME:  0 min 0 seconds  FINDINGS: A scout film of the abdomen demonstrates distended small bowel loops. Gas in the colon and a small amount a gas in the rectum noted.  After administration of oral contrast, contrast flows freely into the proximal small bowel. Following this, very slow progression of oral contrast is noted with distended mid small bowel loops noted. Contrast reaches the colon by 5 hr. Contrast within the distal small bowel is very dilute and therefore spot compression view was not performed.  IMPRESSION: Distended proximal small bowel loops with very slow progression of contrast into the colon. This likely represents a partial small bowel obstruction. Spot compression views were not obtained given very diluted contrast within the distal small bowel.   Electronically Signed   By: Laveda Abbe M.D.   On: 06/23/2014 22:51   Dg Abd 2 Views  06/27/2014   CLINICAL DATA:  Abdominal pain.  EXAM: ABDOMEN - 2 VIEW  COMPARISON:  June 26, 2014.  FINDINGS: Nasogastric tube tip is seen in proximal stomach. Residual contrast remains within the colon which is slightly decreased compared to prior exam. No abnormal bowel gas pattern is noted.  IMPRESSION: No evidence of bowel obstruction or ileus. Residual contrast remains within the colon.   Electronically Signed   By: Roque Lias M.D.   On: 06/27/2014 09:53   Dg Abd 2 Views  06/26/2014   CLINICAL DATA:  Abdominal pain, suspected small bowel obstruction ; assess nasogastric tube positioning  EXAM: ABDOMEN - 2 VIEW  COMPARISON:  Portable chest x-ray of June 25, 2014  FINDINGS: There are loops of mildly distended small bowel in the left mid abdomen. These have decreased in conspicuity since the earlier study. There remains distention of the  ascending and transverse and proximal descending colons with contrast and gas from the CT scan of September 7th. The nasogastric tube proximal port lies at the level of the GE junction. The lung bases are clear.  IMPRESSION: 1. There persistently dilated loops of small bowel which have become less conspicuous which may indicate resolving distal small bowel obstruction. 2. The nasogastric tubes proximal port lies at the level of the GE junction and advancement by at least 5 cm is recommended. 3. There remains mild gaseous distention of the ascending and transverse and proximal descending portions of the colon.   Electronically Signed   By: David  [place]   On: 06/26/2014 08:17   Dg Abd 2 Views  06/25/2014   CLINICAL DATA:  Abdominal pain.  EXAM: ABDOMEN - 2 VIEW  COMPARISON:  06/23/2014.  FINDINGS: Compared to the prior study, the oral contrast material is now noted in the colon, predominantly within the cecum. Several loops of gas-filled small bowel remain dilated measuring up to 5.3 cm in diameter in the left upper quadrant noted on today's study. No pneumoperitoneum.  IMPRESSION: 1. Findings remain compatible with partial small bowel obstruction, as above. 2. No  pneumoperitoneum.   Electronically Signed   By: Trudie Reed M.D.   On: 06/25/2014 10:42   Dg Abd 2 Views  06/23/2014   CLINICAL DATA:  Abdominal pain  EXAM: ABDOMEN - 2 VIEW  COMPARISON:  06/19/2014  FINDINGS: Scattered large and small bowel gas is identified. Dilatation of small bowel is again identified but the colonic gas would indicate more of a partial small bowel obstruction or ileus. Correlation with physical exam is recommended. The overall appearance is stable from that seen on recent CT. No acute mass is noted. No abnormal calcifications or abnormal bony changes are noted.  IMPRESSION: Persistent small bowel dilatation stable from the prior exam. This likely represents a partial small bowel obstruction or ileus. Correlation with the  physical exam is recommended.   Electronically Signed   By: Alcide Clever M.D.   On: 06/23/2014 07:45   Dg Abd Portable 1v  06/25/2014   CLINICAL DATA:  Nasogastric tube placement.  EXAM: PORTABLE ABDOMEN - 1 VIEW  COMPARISON:  Abdominal radiograph performed earlier today at 9:24 a.m.  FINDINGS: Distended air-filled loops of small bowel are now seen throughout the abdomen and pelvis, more prominent than on prior studies. Air is also seen partially filling the colon, with contrast again noted along the ascending and transverse colon. The appearance raises suspicion for ileus, although partial small bowel obstruction remains a possibility.  The patient's enteric tube is seen ending overlying the body of the stomach, with the side port also ending overlying the body of the stomach. No definite free intra-abdominal air is identified, though evaluation for free air is limited on a single supine view.  No acute osseous abnormalities are seen.  IMPRESSION: 1. Enteric tube seen ending overlying the body of the stomach, with the side port also ending overlying the body of the stomach. 2. Mildly more prominent diffuse distention of small bowel loops within the abdomen and pelvis. The appearance raises suspicion for ileus, though partial small bowel obstruction remains a possibility. Contrast is again seen filling the ascending and transverse colon. No free intra-abdominal air seen.   Electronically Signed   By: Roanna Raider M.D.   On: 06/25/2014 23:28    Jeoffrey Massed, MD  [facility] Pager:[phone]  If 7PM-7AM, please contact night-coverage [email or web address] Password [email or web address] 06/27/2014, 1:02 PM   LOS: 8 days   **Disclaimer: This note may have been dictated with voice recognition software. Similar sounding words can inadvertently be transcribed and this note may contain transcription errors which may not have been corrected upon publication of note.**

## 2014-06-28 ENCOUNTER — Encounter (HOSPITAL_COMMUNITY)
Admission: EM | Disposition: A | Discharge status: Home / Self Care | Payer: Self-pay | Source: Home / Self Care | Attending: Internal Medicine

## 2014-06-28 ENCOUNTER — Encounter (HOSPITAL_COMMUNITY): Payer: Medicaid Other | Admitting: Anesthesiology

## 2014-06-28 ENCOUNTER — Inpatient Hospital Stay (HOSPITAL_COMMUNITY): Payer: Medicaid Other | Admitting: Anesthesiology

## 2014-06-28 ENCOUNTER — Encounter (HOSPITAL_COMMUNITY): Payer: Self-pay | Admitting: Gastroenterology

## 2014-06-28 DIAGNOSIS — K529 Noninfective gastroenteritis and colitis, unspecified: Secondary | ICD-10-CM | POA: Diagnosis present

## 2014-06-28 DIAGNOSIS — T8859XA Other complications of anesthesia, initial encounter: Secondary | ICD-10-CM

## 2014-06-28 DIAGNOSIS — K56600 Partial intestinal obstruction, unspecified as to cause: Secondary | ICD-10-CM | POA: Diagnosis present

## 2014-06-28 HISTORY — DX: Other complications of anesthesia, initial encounter: T88.59XA

## 2014-06-28 HISTORY — PX: COLONOSCOPY WITH PROPOFOL: SHX5780

## 2014-06-28 SURGERY — COLONOSCOPY
Anesthesia: Monitor Anesthesia Care

## 2014-06-28 SURGERY — COLONOSCOPY WITH PROPOFOL
Anesthesia: Monitor Anesthesia Care

## 2014-06-28 MED ORDER — LIDOCAINE HCL (CARDIAC) 20 MG/ML IV SOLN
INTRAVENOUS | Status: DC | PRN
  Administered 2014-06-28: 40 mg via INTRAVENOUS

## 2014-06-28 MED ORDER — GLYCOPYRROLATE 0.2 MG/ML IJ SOLN
0.4000 mg | Freq: Once | INTRAMUSCULAR | Status: AC
  Administered 2014-06-28: 0.4 mg via INTRAVENOUS

## 2014-06-28 MED ORDER — LACTATED RINGERS IV SOLN
INTRAVENOUS | Status: DC
  Administered 2014-06-28: 1000 mL via INTRAVENOUS

## 2014-06-28 MED ORDER — OXYCODONE HCL 5 MG PO TABS: 5.0000 mg | ORAL_TABLET | ORAL | Status: DC | PRN

## 2014-06-28 MED ORDER — PROPOFOL 10 MG/ML IV BOLUS
INTRAVENOUS | Status: DC | PRN
  Administered 2014-06-28: 100 mg via INTRAVENOUS

## 2014-06-28 MED ORDER — GLYCOPYRROLATE 0.2 MG/ML IJ SOLN
INTRAMUSCULAR | Status: AC
  Filled 2014-06-28: qty 2

## 2014-06-28 MED ORDER — PROPOFOL INFUSION 10 MG/ML OPTIME
INTRAVENOUS | Status: DC | PRN
  Administered 2014-06-28: 100 ug/kg/min via INTRAVENOUS

## 2014-06-28 MED ORDER — DICYCLOMINE HCL 20 MG PO TABS
20.0000 mg | ORAL_TABLET | Freq: Three times a day (TID) | ORAL | Status: DC
  Filled 2014-06-28 (×2): qty 1

## 2014-06-28 MED ORDER — LACTATED RINGERS IV SOLN
INTRAVENOUS | Status: DC | PRN
  Administered 2014-06-28: 12:00:00 via INTRAVENOUS

## 2014-06-28 MED ORDER — DICYCLOMINE HCL 20 MG PO TABS: 20.0000 mg | ORAL_TABLET | Freq: Three times a day (TID) | ORAL | Status: DC

## 2014-06-28 NOTE — Discharge Summary (Addendum)
Physician Discharge Summary  Blake Bradley WJX:[id number] DOB: [birth year] DOA: 06/19/2014  PCP: Nadean Corwin, MD  Admit date: 06/19/2014 Discharge date: 06/28/2014  Time spent: 45 minutes  Recommendations for Outpatient Follow-up:  1. Cbc / bmet in 1 week.  Ensure WBC has normalized and potassium is normal. 2. Recheck UA at next visit-to make sure proteinuria has resolved  Discharge Diagnoses:  Principal Problem:   Abdominal pain Active Problems:   Nausea vomiting and diarrhea   Partial small bowel obstruction   Noninfectious gastroenteritis and colitis  Discharge Condition: stable  Diet recommendation: soft diet.  Slowly advance as tolerated.  History of present illness:  23 yo male with history of acne on Accutane presented with abdominal pain, vomiting and diarrhea that started after eating at a [employer] 4 days prior to admission.  CT scan was significant for dilation of the small and large bowel.  Patient's mother has a history of IBD and the family was concerned that the patient may have developed IBD after being on Accutane for 5 months.  Hospital Course:  Abdominal pain / vomiting / diarrhea  -secondary to enteritis and subsequent post enteritis partial SBO vs ileus. -Suspected infectious etiology. However GI panel negative, Stool cultures results report no suspicious colonies, C Diff PCR neg.  -Admitted and started on IVF, kept NPO, and empiric Cipro/Flagyl stopped after 4 days of Tx. Unfortunately slow improvement, complicated by development of SBO which prolonged his hospital stay. Initially very reluctant to place NG on 9/12-however after d/w patient subsequently placed. Better with supportive care.  N/G tube was able to be removed on 9/15.  The patient tolerated consuming a bowel prep.  He underwent colonoscopy on 9/16 which showed normal mucosa to the terminal ileum.  The patient was able to tolerate a soft diet post procedure and was discharged to home in  the care of his parents.  Partial SBO  -see above.  Now resolved. -suspect secondary to mucosal edema/thickening  Diffuse skin rash  -suspect hives-off all antibiotics-given one dose of Solumedrol, c/w Benadryl and pepcid. Resolved   Acne-Accutane use.  Accutane was held during admission.  Likely safe to resume, as colonoscopy was clear of IBD.  Will request patient discuss whether or not to resume accutane with his dermatologist.  Proteinuria  -Greater than 300 on admit u/a-However repeat UA proteinuria decreased to 100. Could be from acute febrile illness  -Creatinine within normal limits.  -Stable for outpatient work up-suspect will need to repeat UA in the outpatient setting once recovers from acute illness    Procedures:  Colonoscopy 9/16  N/G placement and subsequent removal.  Consultations:  Gastroenterology Corinda Gubler)  Discharge Exam: Filed Vitals:   06/28/14 1532  BP: 126/86  Pulse: 95  Temp: 98.5 F (36.9 C)  Resp: 20    General: Awake, Alert, ambulating.  Well appearing Cardiovascular: tachycardic, regular rhythm, no m/r/g Respiratory: cta no w/c/r Abdomen:  Thin, soft, nt, nd, no organomegaly. Extremities:  5/5 strength, no edema, ambulating.  Discharge Instructions   Discharge Instructions   Diet general    Complete by:  As directed      Increase activity slowly    Complete by:  As directed           Current Discharge Medication List    START taking these medications   Details  dicyclomine (BENTYL) 20 MG tablet Take 1 tablet (20 mg total) by mouth 3 (three) times daily before meals. Qty: 90 tablet, Refills: 0  CONTINUE these medications which have NOT CHANGED   Details  cetirizine (ZYRTEC) 10 MG tablet Take 10 mg by mouth daily.    ibuprofen (ADVIL,MOTRIN) 200 MG tablet Take 200 mg by mouth every 6 (six) hours as needed (pain).    ISOtretinoin (ACCUTANE) 40 MG capsule Take 40 mg by mouth daily.      STOP taking these medications      magnesium gluconate (MAGONATE) 500 MG tablet      ondansetron (ZOFRAN ODT) 4 MG disintegrating tablet        Allergies  Allergen Reactions  . Prednisone Hives   Follow-up Information   Follow up with Nadean Corwin, MD In 2 weeks.   Specialty:  Internal Medicine   Contact information:   [address] [place] [address] [phone]        The results of significant diagnostics from this hospitalization (including imaging, microbiology, ancillary and laboratory) are listed below for reference.    Significant Diagnostic Studies: Ct Abdomen Pelvis W Contrast  06/19/2014   CLINICAL DATA:  Low abdominal pain and fever for 4 days. Nausea and vomiting.  EXAM: CT ABDOMEN AND PELVIS WITH CONTRAST  TECHNIQUE: Multidetector CT imaging of the abdomen and pelvis was performed using the standard protocol following bolus administration of intravenous contrast.  CONTRAST:  OMNIPAQUE IOHEXOL 300 MG/ML  SOLN  COMPARISON:  None.  FINDINGS: The liver, spleen, gallbladder, pancreas, adrenal glands, kidneys, abdominal aorta, inferior vena cava, and retroperitoneal lymph nodes are unremarkable. Small accessory spleen. Air-fluid levels in the colon consistent with liquid stool. No colonic distention or wall thickening. Small bowel are mildly distended proximally with distal decompression. Wall thickening is suggested in distal small bowel. Changes could represent infectious enteritis or inflammatory enteritis such as Crohn's disease. No definite abscess. There is mild edema/stranding in the mesenteric. Moderately prominent mesenteric lymph nodes are likely reactive. No free air or free fluid in the abdomen.  Pelvis: Bladder wall is not thickened. No free or loculated pelvic fluid collections. Mild thickening of decompressed sigmoid colonic wall. No destructive bone lesions.  IMPRESSION: Wall thickening in the distal small bowel with mesenteric edema and prominent mesenteric  lymph nodes. Dilatation of proximal small bowel. Changes may represent infectious or inflammatory enteritis. Partial small bowel obstruction due to stricturing is not excluded. Sigmoid colon also appears thickened, also possibly infectious or inflammatory.   Electronically Signed   By: Burman Nieves M.D.   On: 06/19/2014 23:14   Dg Small Bowel  06/23/2014   CLINICAL DATA:  23 year old male with Crohn's disease, abdominal and pelvic pain, nausea vomiting and diarrhea.  EXAM: SMALL BOWEL SERIES  COMPARISON:  06/19/2014 CT  TECHNIQUE: Following ingestion of thin barium, serial small bowel images were obtained.  FLUOROSCOPY TIME:  0 min 0 seconds  FINDINGS: A scout film of the abdomen demonstrates distended small bowel loops. Gas in the colon and a small amount a gas in the rectum noted.  After administration of oral contrast, contrast flows freely into the proximal small bowel. Following this, very slow progression of oral contrast is noted with distended mid small bowel loops noted. Contrast reaches the colon by 5 hr. Contrast within the distal small bowel is very dilute and therefore spot compression view was not performed.  IMPRESSION: Distended proximal small bowel loops with very slow progression of contrast into the colon. This likely represents a partial small bowel obstruction. Spot compression views were not obtained given very diluted contrast within the distal small  bowel.   Electronically Signed   By: Laveda Abbe M.D.   On: 06/23/2014 22:51   Dg Abd 2 Views  06/27/2014   CLINICAL DATA:  Abdominal pain.  EXAM: ABDOMEN - 2 VIEW  COMPARISON:  June 26, 2014.  FINDINGS: Nasogastric tube tip is seen in proximal stomach. Residual contrast remains within the colon which is slightly decreased compared to prior exam. No abnormal bowel gas pattern is noted.  IMPRESSION: No evidence of bowel obstruction or ileus. Residual contrast remains within the colon.   Electronically Signed   By: Roque Lias M.D.    On: 06/27/2014 09:53   Dg Abd 2 Views  06/26/2014   CLINICAL DATA:  Abdominal pain, suspected small bowel obstruction ; assess nasogastric tube positioning  EXAM: ABDOMEN - 2 VIEW  COMPARISON:  Portable chest x-ray of June 25, 2014  FINDINGS: There are loops of mildly distended small bowel in the left mid abdomen. These have decreased in conspicuity since the earlier study. There remains distention of the ascending and transverse and proximal descending colons with contrast and gas from the CT scan of September 7th. The nasogastric tube proximal port lies at the level of the GE junction. The lung bases are clear.  IMPRESSION: 1. There persistently dilated loops of small bowel which have become less conspicuous which may indicate resolving distal small bowel obstruction. 2. The nasogastric tubes proximal port lies at the level of the GE junction and advancement by at least 5 cm is recommended. 3. There remains mild gaseous distention of the ascending and transverse and proximal descending portions of the colon.   Electronically Signed   By: David  [place]   On: 06/26/2014 08:17   Dg Abd 2 Views  06/25/2014   CLINICAL DATA:  Abdominal pain.  EXAM: ABDOMEN - 2 VIEW  COMPARISON:  06/23/2014.  FINDINGS: Compared to the prior study, the oral contrast material is now noted in the colon, predominantly within the cecum. Several loops of gas-filled small bowel remain dilated measuring up to 5.3 cm in diameter in the left upper quadrant noted on today's study. No pneumoperitoneum.  IMPRESSION: 1. Findings remain compatible with partial small bowel obstruction, as above. 2. No pneumoperitoneum.   Electronically Signed   By: Trudie Reed M.D.   On: 06/25/2014 10:42   Dg Abd 2 Views  06/23/2014   CLINICAL DATA:  Abdominal pain  EXAM: ABDOMEN - 2 VIEW  COMPARISON:  06/19/2014  FINDINGS: Scattered large and small bowel gas is identified. Dilatation of small bowel is again identified but the colonic gas would indicate  more of a partial small bowel obstruction or ileus. Correlation with physical exam is recommended. The overall appearance is stable from that seen on recent CT. No acute mass is noted. No abnormal calcifications or abnormal bony changes are noted.  IMPRESSION: Persistent small bowel dilatation stable from the prior exam. This likely represents a partial small bowel obstruction or ileus. Correlation with the physical exam is recommended.   Electronically Signed   By: Alcide Clever M.D.   On: 06/23/2014 07:45   Dg Abd Portable 1v  06/25/2014   CLINICAL DATA:  Nasogastric tube placement.  EXAM: PORTABLE ABDOMEN - 1 VIEW  COMPARISON:  Abdominal radiograph performed earlier today at 9:24 a.m.  FINDINGS: Distended air-filled loops of small bowel are now seen throughout the abdomen and pelvis, more prominent than on prior studies. Air is also seen partially filling the colon, with contrast again noted along the ascending and  transverse colon. The appearance raises suspicion for ileus, although partial small bowel obstruction remains a possibility.  The patient's enteric tube is seen ending overlying the body of the stomach, with the side port also ending overlying the body of the stomach. No definite free intra-abdominal air is identified, though evaluation for free air is limited on a single supine view.  No acute osseous abnormalities are seen.  IMPRESSION: 1. Enteric tube seen ending overlying the body of the stomach, with the side port also ending overlying the body of the stomach. 2. Mildly more prominent diffuse distention of small bowel loops within the abdomen and pelvis. The appearance raises suspicion for ileus, though partial small bowel obstruction remains a possibility. Contrast is again seen filling the ascending and transverse colon. No free intra-abdominal air seen.   Electronically Signed   By: Roanna Raider M.D.   On: 06/25/2014 23:28    Microbiology: Recent Results (from the past 240 hour(s))   CULTURE, BLOOD (ROUTINE X 2)     Status: None   Collection Time    06/20/14  5:15 AM      Result Value Ref Range Status   Specimen Description BLOOD RIGHT ARM   Final   Special Requests BOTTLES DRAWN AEROBIC AND ANAEROBIC 8CC EA   Final   Culture  Setup Time     Final   Value: 06/20/2014 09:39     Performed at [facility]   Culture     Final   Value: NO GROWTH 5 DAYS     Performed at [facility]   Report Status 06/26/2014 FINAL   Final  CULTURE, BLOOD (ROUTINE X 2)     Status: None   Collection Time    06/20/14  5:27 AM      Result Value Ref Range Status   Specimen Description BLOOD LEFT HAND   Final   Special Requests BOTTLES DRAWN AEROBIC AND ANAEROBIC 5CC EA   Final   Culture  Setup Time     Final   Value: 06/20/2014 09:38     Performed at [facility]   Culture     Final   Value: NO GROWTH 5 DAYS     Performed at [facility]   Report Status 06/26/2014 FINAL   Final  CLOSTRIDIUM DIFFICILE BY PCR     Status: None   Collection Time    06/20/14 10:53 AM      Result Value Ref Range Status   C difficile by pcr NEGATIVE  NEGATIVE Final  STOOL CULTURE     Status: None   Collection Time    06/20/14 10:53 AM      Result Value Ref Range Status   Specimen Description STOOL   Final   Special Requests NONE   Final   Culture     Final   Value: NO SALMONELLA, SHIGELLA, CAMPYLOBACTER, YERSINIA, OR E.COLI 0157:H7 ISOLATED     Performed at [facility]   Report Status 06/24/2014 FINAL   Final     Labs: Basic Metabolic Panel:  Recent Labs Lab 06/23/14 0706 06/24/14 0628 06/25/14 0844 06/26/14 0500 06/27/14 0500  NA 139 140 140 140 139  K 4.0 3.5* 3.6* 3.8 4.0  CL 98 102 101 98 100  CO2 32 30  GLUCOSE 94 90 99 107* 98  BUN CREATININE 0.86 0.83 0.81 0.88 0.92  CALCIUM 8.5 8.2* 8.6 8.8 8.7  MG  --   --  2.1  --   --    Liver Function Tests:  Recent Labs Lab 06/26/14 0500  AST 18  ALT 16  ALKPHOS 95   BILITOT 0.5  PROT 6.2  ALBUMIN 2.9*   CBC:  Recent Labs Lab 06/22/14 0803 06/23/14 0706 06/25/14 0844 06/26/14 0500 06/27/14 0500  WBC 11.8* 10.8* 12.0* 10.8* 11.9*  HGB 13.5 13.7 13.3 13.7 13.5  HCT 39.4 40.1 39.2 41.1 41.0  MCV 86.8 85.7 85.8 86.5 87.0  PLT 220 284 323 361 358    Signed:  Conley Canal [phone]  [facility] 06/28/2014, 4:25 PM  Attending Patient was seen, examined,treatment plan was discussed with the Physician extender. I have directly reviewed the clinical findings, lab, imaging studies and management of this patient in detail. I have made the necessary changes to the above noted documentation, and agree with the documentation, as recorded by the Physician extender.  Windell Norfolk MD [facility].

## 2014-06-28 NOTE — Discharge Instructions (Signed)
Discuss with your dermatologist whether or not to resume accutane.   Start with a soft diet then advance to regular food over the next couple of days.

## 2014-06-28 NOTE — Progress Notes (Signed)
Spoke to the patient and father about administering the tap water enema and educated them on how it works. Patient refused the enema and stated that it was unnecessary.   

## 2014-06-28 NOTE — Progress Notes (Signed)
Lawson Mabin to be D/C'd Home per MD order.  Discussed with the patient and all questions fully answered.    Medication List    STOP taking these medications       magnesium gluconate 500 MG tablet  Commonly known as:  MAGONATE     ondansetron 4 MG disintegrating tablet  Commonly known as:  ZOFRAN ODT      TAKE these medications       cetirizine 10 MG tablet  Commonly known as:  ZYRTEC  Take 10 mg by mouth daily.     dicyclomine 20 MG tablet  Commonly known as:  BENTYL  Take 1 tablet (20 mg total) by mouth 3 (three) times daily before meals.     ibuprofen 200 MG tablet  Commonly known as:  ADVIL,MOTRIN  Take 200 mg by mouth every 6 (six) hours as needed (pain).     ISOtretinoin 40 MG capsule  Commonly known as:  ACCUTANE  Take 40 mg by mouth daily.        VVS, Skin clean, dry and intact without evidence of skin break down, no evidence of skin tears noted. IV catheter discontinued intact. Site without signs and symptoms of complications. Dressing and pressure applied.  An After Visit Summary was printed and given to the patient.  D/c education completed with patient/family including follow up instructions, medication list, d/c activities limitations if indicated, with other d/c instructions as indicated by MD - patient able to verbalize understanding, all questions fully answered.   Patient instructed to return to ED, call 911, or call MD for any changes in condition.   Patient escorted walked down to entrance with RN, and D/C home via private auto.  Alexey Rhoads L 06/28/2014 4:27 PM

## 2014-06-28 NOTE — Transfer of Care (Signed)
Immediate Anesthesia Transfer of Care Note  Patient: Blake Bradley  Procedure(s) Performed: Procedure(s): COLONOSCOPY WITH PROPOFOL (N/A)  Patient Location: Endoscopy Unit  Anesthesia Type:MAC  Level of Consciousness: awake, alert  and oriented  Airway & Oxygen Therapy: Patient Spontanous Breathing  Post-op Assessment: Report given to PACU RN  Post vital signs: Reviewed and stable  Complications: No apparent anesthesia complications

## 2014-06-28 NOTE — Progress Notes (Signed)
Subjective: Completed miralax prep but as of the last hour stool is still murky, yellowish.  No abdominal pain.  Plan: ordered tap water enema x one.   Jennye Moccasin

## 2014-06-28 NOTE — Op Note (Signed)
[facility] [address] [place], [address]   COLONOSCOPY PROCEDURE REPORT  PATIENT: Erric, Machnik  MR#: [id number] BIRTHDATE: 12/24/1990 , 22  yrs. old GENDER: Male ENDOSCOPIST: Iva Boop, MD, [facility] PROCEDURE DATE:  06/28/2014 PROCEDURE:   Colonoscopy, diagnostic First Screening Colonoscopy - Avg.  risk and is 50 yrs.  old or older - No.  Prior Negative Screening - Now for repeat screening. N/A  History of Adenoma - Now for follow-up colonoscopy & has been > or = to 3 yrs.  N/A  Polyps Removed Today? No.  Recommend repeat exam, <10 yrs? No. ASA CLASS:   Class II INDICATIONS:an abnormal CT.   Persistent abdominal pain MEDICATIONS: See Anesthesia Report.  DESCRIPTION OF PROCEDURE:   After the risks benefits and alternatives of the procedure were thoroughly explained, informed consent was obtained.  A digital rectal exam revealed no abnormalities of the rectum.   The Pentax Adult Colon [id number] endoscope was introduced through the anus and advanced to the terminal ileum which was intubated for a short distance. No adverse events experienced.   The quality of the prep was adequate.  The instrument was then slowly withdrawn as the colon was fully examined.   COLON FINDINGS: The mucosa appeared normal in the terminal ileum. The colon mucosa was otherwise normal.  Retroflexed views revealed no abnormalities. The time to cecum=4 minutes 0 seconds. Withdrawal time=15 minutes 0 seconds.  The scope was withdrawn and the procedure completed. COMPLICATIONS: There were no complications.  ENDOSCOPIC IMPRESSION: 1.   Normal mucosa in the terminal ileum 2.   The colon mucosa was otherwise normal - this and clinical course indicate infectious enteritis/colitis that is resolved.  RECOMMENDATIONS: Low fiber diet + dicyclomine prn Home today or tomorrow F/U PCP - watch for post-infectious IBS - could have that See GI if having persistent issues or new  problems   eSigned:  Iva Boop, MD, [facility] 06/28/2014 1:20 PM   cc: The Patient and [name], MD

## 2014-06-28 NOTE — Anesthesia Preprocedure Evaluation (Addendum)
Anesthesia Evaluation  Patient identified by MRN, date of birth, ID band Patient awake    Reviewed: Allergy & Precautions, H&P , NPO status , Patient's Chart, lab work & pertinent test results  Airway Mallampati: I TM Distance: >3 FB Neck ROM: full    Dental  (+) Teeth Intact, Dental Advidsory Given   Pulmonary          Cardiovascular Rhythm:regular     Neuro/Psych PSYCHIATRIC DISORDERS    GI/Hepatic   Endo/Other    Renal/GU      Musculoskeletal   Abdominal   Peds  Hematology   Anesthesia Other Findings   Reproductive/Obstetrics                          Anesthesia Physical Anesthesia Plan  ASA: II  Anesthesia Plan: MAC   Post-op Pain Management:    Induction: Intravenous  Airway Management Planned: Mask  Additional Equipment:   Intra-op Plan:   Post-operative Plan:   Informed Consent: I have reviewed the patients History and Physical, chart, labs and discussed the procedure including the risks, benefits and alternatives for the proposed anesthesia with the patient or authorized representative who has indicated his/her understanding and acceptance.   Dental Advisory Given  Plan Discussed with: CRNA, Anesthesiologist and Surgeon  Anesthesia Plan Comments:        Anesthesia Quick Evaluation

## 2014-06-28 NOTE — Progress Notes (Signed)
Attempted paging GI and GI PA to inform them that pt bowel movements still light brown and almost yellow according to pt. RN has not visualized yet.

## 2014-06-28 NOTE — Progress Notes (Signed)
Paged GI again with no answer. Called endoscopy department with no answer. Will re-attempt later.

## 2014-06-29 ENCOUNTER — Emergency Department (HOSPITAL_COMMUNITY): Payer: Medicaid Other

## 2014-06-29 ENCOUNTER — Encounter (HOSPITAL_COMMUNITY): Payer: Self-pay | Admitting: Internal Medicine

## 2014-06-29 ENCOUNTER — Inpatient Hospital Stay (HOSPITAL_COMMUNITY): Payer: Medicaid Other

## 2014-06-29 ENCOUNTER — Inpatient Hospital Stay (HOSPITAL_COMMUNITY)
Admission: EM | Admit: 2014-06-29 | Discharge: 2014-07-03 | DRG: 372 | Disposition: A | Discharge status: Home / Self Care | Payer: Medicaid Other | Attending: Internal Medicine | Admitting: Internal Medicine

## 2014-06-29 DIAGNOSIS — F319 Bipolar disorder, unspecified: Secondary | ICD-10-CM | POA: Diagnosis present

## 2014-06-29 DIAGNOSIS — R1084 Generalized abdominal pain: Secondary | ICD-10-CM

## 2014-06-29 DIAGNOSIS — K560 Paralytic ileus: Secondary | ICD-10-CM | POA: Diagnosis present

## 2014-06-29 DIAGNOSIS — K3533 Acute appendicitis with perforation and localized peritonitis, with abscess: Secondary | ICD-10-CM | POA: Diagnosis present

## 2014-06-29 DIAGNOSIS — D72829 Elevated white blood cell count, unspecified: Secondary | ICD-10-CM | POA: Diagnosis present

## 2014-06-29 DIAGNOSIS — F988 Other specified behavioral and emotional disorders with onset usually occurring in childhood and adolescence: Secondary | ICD-10-CM | POA: Diagnosis present

## 2014-06-29 DIAGNOSIS — R197 Diarrhea, unspecified: Secondary | ICD-10-CM

## 2014-06-29 DIAGNOSIS — K529 Noninfective gastroenteritis and colitis, unspecified: Secondary | ICD-10-CM

## 2014-06-29 DIAGNOSIS — K36 Other appendicitis: Secondary | ICD-10-CM

## 2014-06-29 DIAGNOSIS — K56600 Partial intestinal obstruction, unspecified as to cause: Secondary | ICD-10-CM

## 2014-06-29 DIAGNOSIS — K56609 Unspecified intestinal obstruction, unspecified as to partial versus complete obstruction: Secondary | ICD-10-CM

## 2014-06-29 DIAGNOSIS — R112 Nausea with vomiting, unspecified: Secondary | ICD-10-CM

## 2014-06-29 DIAGNOSIS — K5289 Other specified noninfective gastroenteritis and colitis: Secondary | ICD-10-CM

## 2014-06-29 HISTORY — DX: Headache: R51

## 2014-06-29 HISTORY — DX: Family history of other specified conditions: Z84.89

## 2014-06-29 HISTORY — DX: Bipolar disorder, unspecified: F31.9

## 2014-06-29 HISTORY — DX: Adverse effect of unspecified anesthetic, initial encounter: T41.45XA

## 2014-06-29 LAB — COMPREHENSIVE METABOLIC PANEL
ALK PHOS: 109 U/L (ref 39–117)
ALT: 26 U/L (ref 0–53)
ANION GAP: 15 (ref 5–15)
AST: 20 U/L (ref 0–37)
Albumin: 3.7 g/dL (ref 3.5–5.2)
BUN: 13 mg/dL (ref 6–23)
CHLORIDE: 99 meq/L (ref 96–112)
CO2: 25 meq/L (ref 19–32)
CREATININE: 1.17 mg/dL (ref 0.50–1.35)
Calcium: 9.5 mg/dL (ref 8.4–10.5)
GFR calc Af Amer: >90 mL/min (ref >90)
GFR calc non Af Amer: 89 mL/min — ABNORMAL LOW (ref >90)
Glucose, Bld: 93 mg/dL (ref 70–99)
Potassium: 4.7 mEq/L (ref 3.7–5.3)
SODIUM: 139 meq/L (ref 137–147)
Total Bilirubin: 0.9 mg/dL (ref 0.3–1.2)
Total Protein: 7.5 g/dL (ref 6.0–8.3)

## 2014-06-29 LAB — CBC WITH DIFFERENTIAL/PLATELET
Basophils Absolute: 0 10*3/uL (ref 0.0–0.1)
Basophils Relative: 0 % (ref 0–1)
EOS PCT: 2 % (ref 0–5)
Eosinophils Absolute: 0.4 10*3/uL (ref 0.0–0.7)
HEMATOCRIT: 47.2 % (ref 39.0–52.0)
HEMOGLOBIN: 16 g/dL (ref 13.0–17.0)
LYMPHS ABS: 1.7 10*3/uL (ref 0.7–4.0)
LYMPHS PCT: 9 % — AB (ref 12–46)
MCH: 29.9 pg (ref 26.0–34.0)
MCHC: 33.9 g/dL (ref 30.0–36.0)
MCV: 88.2 fL (ref 78.0–100.0)
MONO ABS: 0.9 10*3/uL (ref 0.1–1.0)
Monocytes Relative: 5 % (ref 3–12)
Neutro Abs: 15 10*3/uL — ABNORMAL HIGH (ref 1.7–7.7)
Neutrophils Relative %: 84 % — ABNORMAL HIGH (ref 43–77)
Platelets: 498 10*3/uL — ABNORMAL HIGH (ref 150–400)
RBC: 5.35 MIL/uL (ref 4.22–5.81)
RDW: 12.9 % (ref 11.5–15.5)
WBC: 18.1 10*3/uL — ABNORMAL HIGH (ref 4.0–10.5)

## 2014-06-29 LAB — LIPASE, BLOOD: LIPASE: 95 U/L — AB (ref 11–59)

## 2014-06-29 MED ORDER — PROMETHAZINE HCL 25 MG PO TABS: 12.5000 mg | ORAL_TABLET | Freq: Four times a day (QID) | ORAL | Status: DC | PRN

## 2014-06-29 MED ORDER — SODIUM CHLORIDE 0.9 % IV SOLN
INTRAVENOUS | Status: AC
  Administered 2014-06-29 – 2014-06-30 (×4): via INTRAVENOUS
  Administered 2014-07-01: 1000 mL via INTRAVENOUS
  Administered 2014-07-01 – 2014-07-02 (×2): via INTRAVENOUS

## 2014-06-29 MED ORDER — PROMETHAZINE HCL 25 MG/ML IJ SOLN: 12.5000 mg | Freq: Four times a day (QID) | INTRAMUSCULAR | Status: DC | PRN

## 2014-06-29 MED ORDER — IOHEXOL 300 MG/ML  SOLN
25.0000 mL | Freq: Once | INTRAMUSCULAR | Status: AC | PRN
  Administered 2014-06-29: 25 mL via ORAL

## 2014-06-29 MED ORDER — PROMETHAZINE HCL 25 MG/ML IJ SOLN
12.5000 mg | Freq: Once | INTRAMUSCULAR | Status: AC
  Administered 2014-06-29: 12.5 mg via INTRAVENOUS
  Filled 2014-06-29: qty 1

## 2014-06-29 MED ORDER — ENOXAPARIN SODIUM 40 MG/0.4ML ~~LOC~~ SOLN
40.0000 mg | SUBCUTANEOUS | Status: DC
  Administered 2014-06-29 – 2014-07-02 (×4): 40 mg via SUBCUTANEOUS
  Filled 2014-06-29 (×5): qty 0.4

## 2014-06-29 MED ORDER — ACETAMINOPHEN 325 MG PO TABS: 650.0000 mg | ORAL_TABLET | Freq: Four times a day (QID) | ORAL | Status: DC | PRN

## 2014-06-29 MED ORDER — PIPERACILLIN-TAZOBACTAM 3.375 G IVPB
3.3750 g | Freq: Three times a day (TID) | INTRAVENOUS | Status: DC
  Administered 2014-06-29 – 2014-07-03 (×11): 3.375 g via INTRAVENOUS
  Filled 2014-06-29 (×14): qty 50

## 2014-06-29 MED ORDER — ONDANSETRON HCL 4 MG/2ML IJ SOLN: 4.0000 mg | Freq: Four times a day (QID) | INTRAMUSCULAR | Status: DC | PRN

## 2014-06-29 MED ORDER — PROMETHAZINE HCL 12.5 MG RE SUPP: 12.5000 mg | Freq: Four times a day (QID) | RECTAL | Status: DC | PRN

## 2014-06-29 MED ORDER — SODIUM CHLORIDE 0.9 % IV BOLUS (SEPSIS)
1000.0000 mL | Freq: Once | INTRAVENOUS | Status: AC
  Administered 2014-06-29: 1000 mL via INTRAVENOUS

## 2014-06-29 MED ORDER — METRONIDAZOLE IN NACL 5-0.79 MG/ML-% IV SOLN: 500.0000 mg | Freq: Three times a day (TID) | INTRAVENOUS | Status: DC

## 2014-06-29 MED ORDER — DICYCLOMINE HCL 20 MG PO TABS
20.0000 mg | ORAL_TABLET | Freq: Three times a day (TID) | ORAL | Status: DC
  Administered 2014-06-30 – 2014-07-03 (×11): 20 mg via ORAL
  Filled 2014-06-29 (×13): qty 1

## 2014-06-29 MED ORDER — ONDANSETRON HCL 4 MG/2ML IJ SOLN
4.0000 mg | Freq: Once | INTRAMUSCULAR | Status: AC
  Administered 2014-06-29: 4 mg via INTRAVENOUS
  Filled 2014-06-29: qty 2

## 2014-06-29 MED ORDER — IOHEXOL 300 MG/ML  SOLN
100.0000 mL | Freq: Once | INTRAMUSCULAR | Status: AC | PRN
  Administered 2014-06-29: 100 mL via INTRAVENOUS

## 2014-06-29 MED ORDER — ACETAMINOPHEN 650 MG RE SUPP: 650.0000 mg | Freq: Four times a day (QID) | RECTAL | Status: DC | PRN

## 2014-06-29 MED ORDER — ONDANSETRON HCL 4 MG PO TABS: 4.0000 mg | ORAL_TABLET | Freq: Four times a day (QID) | ORAL | Status: DC | PRN

## 2014-06-29 MED ORDER — HYDROMORPHONE HCL 1 MG/ML IJ SOLN
1.0000 mg | INTRAMUSCULAR | Status: DC | PRN
  Administered 2014-06-29 – 2014-07-02 (×7): 1 mg via INTRAVENOUS
  Filled 2014-06-29 (×7): qty 1

## 2014-06-29 MED ORDER — HYDROMORPHONE HCL 1 MG/ML IJ SOLN
1.0000 mg | Freq: Once | INTRAMUSCULAR | Status: AC
  Administered 2014-06-29: 1 mg via INTRAVENOUS
  Filled 2014-06-29: qty 1

## 2014-06-29 NOTE — Anesthesia Postprocedure Evaluation (Signed)
  Anesthesia Post-op Note  Patient: Blake Bradley  Procedure(s) Performed: Procedure(s): COLONOSCOPY WITH PROPOFOL (N/A)  Patient Location: PACU  Anesthesia Type:MAC  Level of Consciousness: awake, alert , oriented and patient cooperative  Airway and Oxygen Therapy: Patient Spontanous Breathing  Post-op Pain: none  Post-op Assessment: Post-op Vital signs reviewed, Patient's Cardiovascular Status Stable, Respiratory Function Stable, Patent Airway, No signs of Nausea or vomiting and Pain level controlled  Post-op Vital Signs: stable  Last Vitals:  Filed Vitals:   06/28/14 1532  BP: 126/86  Pulse: 95  Temp: 36.9 C  Resp: 20    Complications: No apparent anesthesia complications

## 2014-06-29 NOTE — Consult Note (Signed)
Reason for Consult:persistent nausea, vomiting, diarrhea Referring Physician: Dr. Berniece Salines Gloss is an 23 y.o. male.  HPI: The pt is a 23yo wm who has been in the hospital for the last 2 weeks with nausea, vomiting, and diarrhea. He was feeling better and went home yesterday but symptoms returned and he is back in ER. He had a CT 10 days ago that showed a thickened loop of small bowel. He denies abdominal pain. His wbc is now 18,000 but no fever or tachycardia.  Past Medical History  Diagnosis Date  . Allergy   . ADD (attention deficit disorder)     Past Surgical History  Procedure Laterality Date  . 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 Colonoscopy with propofol N/A 06/28/2014    Procedure: COLONOSCOPY WITH PROPOFOL;  Surgeon: [name], MD;  Location: [place];  Service: Endoscopy;  Laterality: N/A;    Family History  Problem Relation Age of Onset  . Cancer Mother     hx breast cancer  . Ulcerative colitis Mother     Social History:  reports that he has never smoked. He does not have any smokeless tobacco history on file. He reports that he does not drink alcohol or use illicit drugs.  Allergies:  Allergies  Allergen Reactions  . Prednisone Hives    Medications: I have reviewed the patient'Bradley current medications.  Results for orders placed during the hospital encounter of 06/29/14 (from the past 48 hour(Bradley))  CBC WITH DIFFERENTIAL     Status: Abnormal   Collection Time    06/29/14  2:44 PM      Result Value Ref Range   WBC 18.1 (*) 4.0 - 10.5 K/uL   RBC 5.35  4.22 - 5.81 MIL/uL   Hemoglobin 16.0  13.0 - 17.0 g/dL   HCT 47.2  39.0 - 52.0 %   MCV 88.2  78.0 - 100.0 fL   MCH 29.9  26.0 - 34.0 pg   MCHC 33.9  30.0 - 36.0 g/dL   RDW 12.9  11.5 - 15.5 %   Platelets 498 (*) 150 - 400 K/uL   Neutrophils Relative % 84 (*) 43 - 77 %   Neutro Abs 15.0 (*) 1.7 - 7.7 K/uL   Lymphocytes Relative 9 (*) 12 - 46 %   Lymphs Abs 1.7  0.7 - 4.0 K/uL   Monocytes Relative 5  3 - 12 %   Monocytes Absolute 0.9  0.1 - 1.0 K/uL   Eosinophils Relative 2  0 - 5 %   Eosinophils Absolute 0.4  0.0 - 0.7 K/uL   Basophils Relative 0  0 - 1 %   Basophils Absolute 0.0  0.0 - 0.1 K/uL  COMPREHENSIVE METABOLIC PANEL     Status: Abnormal   Collection Time    06/29/14  2:44 PM      Result Value Ref Range   Sodium 139  137 - 147 mEq/L   Potassium 4.7  3.7 - 5.3 mEq/L   Chloride 99  96 - 112 mEq/L   CO2 25  19 - 32 mEq/L   Glucose, Bld 93  70 - 99 mg/dL   BUN 13  6 - 23 mg/dL   Creatinine, Ser 1.17  0.50 - 1.35 mg/dL   Calcium 9.5  8.4 - 10.5 mg/dL   Total Protein 7.5  6.0 - 8.3 g/dL   Albumin 3.7  3.5 - 5.2 g/dL   AST 20  0 -  37 U/L   ALT 26  0 - 53 U/L   Alkaline Phosphatase 109  39 - 117 U/L   Total Bilirubin 0.9  0.3 - 1.2 mg/dL   GFR calc non Af Amer 89 (*) >90 mL/min   GFR calc Af Amer >90  >90 mL/min   Comment: (NOTE)     The eGFR has been calculated using the CKD EPI equation.     This calculation has not been validated in all clinical situations.     eGFR'Bradley persistently <90 mL/min signify possible Chronic Kidney     Disease.   Anion gap 15  5 - 15  LIPASE, BLOOD     Status: Abnormal   Collection Time    06/29/14  2:44 PM      Result Value Ref Range   Lipase 95 (*) 11 - 59 U/L    Dg Abd 2 Views  06/29/2014   CLINICAL DATA:  Generalized abdominal pain. nausea and vomiting. Diarrhea. One day status post colonoscopy.  EXAM: ABDOMEN - 2 VIEW  COMPARISON:  06/27/2014  FINDINGS: Multiple mildly dilated small bowel loops are again seen in the central abdomen containing air-fluid levels. Colon is nondilated and contains some gas and contrast material. This could be due to a small bowel ileus or partial small bowel obstruction. There is no evidence of free air.  IMPRESSION: Small bowel ileus versus partial small bowel obstruction. No evidence of free air.   Electronically Signed   By: Earle Gell M.D.   On: 06/29/2014 18:59    Review of  Systems  Constitutional: Positive for weight loss. Negative for fever.  HENT: Negative.   Eyes: Negative.   Respiratory: Negative.   Cardiovascular: Negative.   Gastrointestinal: Positive for nausea, vomiting and diarrhea.  Genitourinary: Negative.   Musculoskeletal: Negative.   Skin: Negative.   Neurological: Positive for weakness.  Endo/Heme/Allergies: Negative.   Psychiatric/Behavioral: Negative.    Blood pressure 119/61, pulse 60, temperature 97.6 F (36.4 C), temperature source Oral, resp. rate 13, SpO2 100.00%. Physical Exam  Constitutional: He is oriented to person, place, and time. He appears well-developed and well-nourished.  HENT:  Head: Normocephalic and atraumatic.  Eyes: Conjunctivae and EOM are normal. Pupils are equal, round, and reactive to light.  Neck: Normal range of motion. Neck supple.  Cardiovascular: Normal rate, regular rhythm and normal heart sounds.   Respiratory: Effort normal and breath sounds normal.  GI: Soft. Bowel sounds are normal. He exhibits no distension. There is no tenderness.  Musculoskeletal: Normal range of motion.  Neurological: He is alert and oriented to person, place, and time.  Skin: Skin is warm and dry.  Psychiatric: He has a normal mood and affect. His behavior is normal.    Assessment/Plan: The pt appears to have some sort of inflammatory process of his small bowel. I would agree with admission to medicine with a GI consult. He should probably be evaluated with CT enterography to look at small bowel again given his persistent symptoms and new elevation of wbc. No indication for urgent surgery at this point. Will follow  Blake Bradley,Blake Bradley 06/29/2014, 8:43 PM

## 2014-06-29 NOTE — ED Provider Notes (Signed)
CSN: [id number]     Arrival date & time 06/29/14  1410 History   First MD Initiated Contact with Patient 06/29/14 1719     Chief Complaint  Patient presents with  . Abdominal Pain     (Consider location/radiation/quality/duration/timing/severity/associated sxs/prior Treatment) HPI Blake Bradley is a 23 y.o. male who presents to ED with complaint of nausea, vomiting, diarrhea, abdominal pain. Patient just discharged from the hospital yesterday after being admitted for 2 weeks. Patient was admitted initially for the same complaints. While in the hospital, patient had numerous studies including CT of his abdomen and pelvis, which showed colitis and possible small bowel obstruction, he had multiple x-rays done, and colonoscopy that was performed yesterday and was normal. Patient was started on a biotics while in the hospital, had nasogastric tube for some time, and states felt better by a last night. Yesterday he his first meal while in the hospital, and felt much better, so he was discharged home. Patient states when he came home, ate some chicken, rice and cheese for dinner, and shortly after that began having severe abdominal cramping again with associated diarrhea and nausea and vomiting. Patient states he had greater than 10 episodes of diarrhea overnight. He states he vomited twice. States nausea is constant and still persistent. He tried to drink some Gatorade this morning which he kept down but states he did not try to eat any solid foods. Patient feels weak, states he is worried he is dehydrated, appears anxious and states " I just dont want to die."   Past Medical History  Diagnosis Date  . Allergy   . ADD (attention deficit disorder)    Past Surgical History  Procedure Laterality Date  . Knee surgery  age 79    recurrent spitz tumor  . Colonoscopy with propofol N/A 06/28/2014    Procedure: COLONOSCOPY WITH PROPOFOL;  Surgeon: Iva Boop, MD;  Location: [facility] ENDOSCOPY;  Service:  Endoscopy;  Laterality: N/A;   Family History  Problem Relation Age of Onset  . Cancer Mother     hx breast cancer  . Ulcerative colitis Mother    History  Substance Use Topics  . Smoking status: Never Smoker   . Smokeless tobacco: Not on file  . Alcohol Use: No    Review of Systems  Constitutional: Positive for fatigue. Negative for fever and chills.  Respiratory: Negative for cough, chest tightness and shortness of breath.   Cardiovascular: Negative for chest pain, palpitations and leg swelling.  Gastrointestinal: Positive for nausea, vomiting, abdominal pain and diarrhea. Negative for abdominal distention.  Genitourinary: Negative for dysuria, frequency and hematuria.  Musculoskeletal: Negative for arthralgias, myalgias, neck pain and neck stiffness.  Skin: Negative for rash.  Allergic/Immunologic: Negative for immunocompromised state.  Neurological: Positive for weakness. Negative for dizziness, light-headedness, numbness and headaches.  All other systems reviewed and are negative.     Allergies  Prednisone  Home Medications   Prior to Admission medications   Medication Sig Start Date End Date Taking? Authorizing Provider  cetirizine (ZYRTEC) 10 MG tablet Take 10 mg by mouth daily.   Yes Historical Provider, MD  dicyclomine (BENTYL) 20 MG tablet Take 1 tablet (20 mg total) by mouth 3 (three) times daily before meals. 06/28/14  Yes Shanker Levora Dredge, MD  ibuprofen (ADVIL,MOTRIN) 200 MG tablet Take 200 mg by mouth every 6 (six) hours as needed for mild pain.    Yes Historical Provider, MD  ISOtretinoin (ACCUTANE) 40 MG capsule Take 40 mg by  mouth daily.   Yes Historical Provider, MD   BP 111/72  Pulse 64  Temp(Src) 97.6 F (36.4 C) (Oral)  Resp 18  SpO2 100% Physical Exam  Nursing note and vitals reviewed. Constitutional: He appears well-developed and well-nourished. No distress.  HENT:  Head: Normocephalic and atraumatic.  Eyes: Conjunctivae are normal.  Neck:  Neck supple.  Cardiovascular: Normal rate, regular rhythm and normal heart sounds.   Pulmonary/Chest: Effort normal. No respiratory distress. He has no wheezes. He has no rales.  Abdominal: Soft. Bowel sounds are normal. He exhibits no distension. There is tenderness. There is no rebound.  LUQ and LLQ tenderness  Musculoskeletal: He exhibits no edema.  Neurological: He is alert.  Skin: Skin is warm and dry.    ED Course  Procedures (including critical care time) Labs Review Labs Reviewed  CBC WITH DIFFERENTIAL - Abnormal; Notable for the following:    WBC 18.1 (*)    Platelets 498 (*)    Neutrophils Relative % 84 (*)    Neutro Abs 15.0 (*)    Lymphocytes Relative 9 (*)    All other components within normal limits  COMPREHENSIVE METABOLIC PANEL - Abnormal; Notable for the following:    GFR calc non Af Amer 89 (*)    All other components within normal limits  LIPASE, BLOOD - Abnormal; Notable for the following:    Lipase 95 (*)    All other components within normal limits  URINALYSIS, ROUTINE W REFLEX MICROSCOPIC    Imaging Review No results found.   EKG Interpretation None      MDM   Final diagnoses:  Generalized abdominal pain  Nausea vomiting and diarrhea   Pt here with abdominal pain that comes and goes, pain for 2 wks,  no pain at time of the examination. States pain is sharp when comes. Nausea, vomiting. Just d/c home yesterday after being hospitalized for 2 wks. Will repeat labs, fluids ordered, zofran.   8:04 PM Pt drank some sprite. Did not vomit, but states very nauseated. Abdomen continues to be benign. Will try more nausea medications. Xray showing ilius vs partial SBO. Pt worried about going home due to severe pain and persistent vomiting. Unable to eat solids. Will call for admission. Also concerning for WBC of 18. Discussed with Dr. Elesa Massed, will admit and check if Medicine wants another CT scan, will most likely need one if not improving.   8:11 PM Spoke  with triad, asked for general surgery consult.   8:32 PM Spoke with Dr. Carolynne Edouard, recommended repeat CT abd pelvis. CT ordered. PT admitted to medical service. PT's VS all within normal, afebrile, antibiotics not started.    Filed Vitals:   06/29/14 1930 06/29/14 2000 06/29/14 2045 06/29/14 2140  BP: 117/60 119/61 132/59 125/53  Pulse: 89 60 60 57  Temp:    98.9 F (37.2 C)  TempSrc:    Oral  Resp: Height:     (1.88 m)  Weight:    153 lb 14.1 oz (69.8 kg)  SpO2: 98% 100% 100% 100%     Lottie Mussel, PA-C 06/30/14 0154

## 2014-06-29 NOTE — Progress Notes (Signed)
ANTIBIOTIC CONSULT NOTE - INITIAL  Pharmacy Consult for Zosyn Indication: appendicitis  Allergies  Allergen Reactions  . Prednisone Hives    Patient Measurements: Height:  (188 cm) Weight: 153 lb 14.1 oz (69.8 kg) IBW/kg (Calculated) : 82.2  Vital Signs: Temp: 98.9 F (37.2 C) (09/17 2140) Temp src: Oral (09/17 2140) BP: 125/53 mmHg (09/17 2140) Pulse Rate: 57 (09/17 2140) Intake/Output from previous day:   Intake/Output from this shift: Total I/O In: 2250 [P.O.:250; I.V.:2000] Out: -   Labs:  Recent Labs  06/27/14 0500 06/29/14 1444  WBC 11.9* 18.1*  HGB 13.5 16.0  PLT 358 498*  CREATININE 0.92 1.17   Estimated Creatinine Clearance: 99.4 ml/min (by C-G formula based on Cr of 1.17). No results found for this basename: VANCOTROUGH, Leodis Binet, VANCORANDOM, GENTTROUGH, GENTPEAK, GENTRANDOM, TOBRATROUGH, TOBRAPEAK, TOBRARND, AMIKACINPEAK, AMIKACINTROU, AMIKACIN,  in the last 72 hours   Microbiology: Recent Results (from the past 720 hour(s))  CULTURE, BLOOD (ROUTINE X 2)     Status: None   Collection Time    06/20/14  5:15 AM      Result Value Ref Range Status   Specimen Description BLOOD RIGHT ARM   Final   Special Requests BOTTLES DRAWN AEROBIC AND ANAEROBIC 8CC EA   Final   Culture  Setup Time     Final   Value: 06/20/2014 09:39     Performed at [facility]   Culture     Final   Value: NO GROWTH 5 DAYS     Performed at [facility]   Report Status 06/26/2014 FINAL   Final  CULTURE, BLOOD (ROUTINE X 2)     Status: None   Collection Time    06/20/14  5:27 AM      Result Value Ref Range Status   Specimen Description BLOOD LEFT HAND   Final   Special Requests BOTTLES DRAWN AEROBIC AND ANAEROBIC 5CC EA   Final   Culture  Setup Time     Final   Value: 06/20/2014 09:38     Performed at [facility]   Culture     Final   Value: NO GROWTH 5 DAYS     Performed at [facility]   Report Status 06/26/2014 FINAL   Final   CLOSTRIDIUM DIFFICILE BY PCR     Status: None   Collection Time    06/20/14 10:53 AM      Result Value Ref Range Status   C difficile by pcr NEGATIVE  NEGATIVE Final  STOOL CULTURE     Status: None   Collection Time    06/20/14 10:53 AM      Result Value Ref Range Status   Specimen Description STOOL   Final   Special Requests NONE   Final   Culture     Final   Value: NO SALMONELLA, SHIGELLA, CAMPYLOBACTER, YERSINIA, OR E.COLI 0157:H7 ISOLATED     Performed at [facility]   Report Status 06/24/2014 FINAL   Final    Medical History: Past Medical History  Diagnosis Date  . Allergy   . ADD (attention deficit disorder)   . Family history of anesthesia complication     "grandmother had problems w/breathing" 06/29/2014)  . Complication of anesthesia 06/28/2014    "HR dropped, couldn't get BP up when put to sleep for colonscopy"  . [id number](956.2)     "maybe monthly" (06/29/2014)  . Bipolar disorder   . Inflammation of small intestine 06/2014 X 2  Medications:  Prescriptions prior to admission  Medication Sig Dispense Refill  . cetirizine (ZYRTEC) 10 MG tablet Take 10 mg by mouth daily.      Marland Kitchen dicyclomine (BENTYL) 20 MG tablet Take 1 tablet (20 mg total) by mouth 3 (three) times daily before meals.  90 tablet  0  . ibuprofen (ADVIL,MOTRIN) 200 MG tablet Take 200 mg by mouth every 6 (six) hours as needed for mild pain.       . ISOtretinoin (ACCUTANE) 40 MG capsule Take 40 mg by mouth daily.       Scheduled:  . [START ON 06/30/2014] dicyclomine  20 mg Oral TID AC  . enoxaparin (LOVENOX) injection  40 mg Subcutaneous Q24H  . metronidazole  500 mg Intravenous Q8H   Infusions:  . sodium chloride 125 mL/hr at 06/29/14 2144   Assessment: 23yo male presents having been in hospital for 2 weeks for N/V/D. Pharmacy is consulted to dose zosyn for appendicitis. Pt is afebrile, WBC 18.1, sCr 1.17 up from baseline ~ 1.0.  Goal of Therapy:  Resolution of infection  Plan:  Start  Zosyn 3.375g IV q8h Follow up culture results and renal function  Arlean Hopping. Newman Pies, PharmD Clinical Pharmacist Pager [phone] 06/29/2014,10:44 PM

## 2014-06-29 NOTE — ED Notes (Signed)
Pt reports 7/10 generalized abdominal pain with N/V/D since AM. Pt was dx yesterday after two week stay in hospital where he was being tx for same. States he had a normal colonoscopy yesterday. Pt reports taking nausea medication prior to arrival with minimal relief. PT in NAD. AO x4.

## 2014-06-29 NOTE — H&P (Signed)
Patient's PCP: Nadean Corwin, MD Patient's GI: Dr. Marina Goodell  Chief Complaint: Abdominal cramping, nausea, vomiting  History of Present Illness: Blake Bradley is a 23 y.o. Caucasian male with history of acne was on Accutane, seasonal allergies, and attention deficit disorder who presents with the above complaints.  Patient was recently hospitalized from 06/19/2014 till 06/28/2014 for similar complaints.  Patient was evaluated by both GI and general surgery.  It was thought that his symptoms were secondary to enteritis with subsequent post enteritis partial small bowel obstruction versus ileus.  He also had a colonoscopy on 06/28/2014 which showed normal mucosa in the terminal ileum and colon mucosa was otherwise normal.  Patient was subsequently discharged yesterday after he demonstrated that he could tolerated solid food.  Subsequently after discharge, he went to a fast food place and had a kids meal which he did not complete because he was nauseated.  During the course of the night he had at least 10 episodes of diarrhea which was loose and yellow in color.  Denies any blood.  He also had numerous episodes of vomiting.  He contacted his gastroenterologist office, he was instructed to come to the emergency department for further evaluation.  Since being in the emergency department, he has been nauseated but has not vomited or had diarrhea.  Denies any fevers but reports having sweats during the night.  Complains of abdominal cramping.  Does complain of chest pain from nausea and vomiting.  Denies any shortness of breath.  Denies any headaches or vision changes.  Review of Systems: All systems reviewed with the patient and positive as per history of present illness, otherwise all other systems are negative.  Past Medical History  Diagnosis Date  . Allergy   . ADD (attention deficit disorder)    Past Surgical History  Procedure Laterality Date  . 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 Colonoscopy with propofol N/A 06/28/2014    Procedure: COLONOSCOPY WITH PROPOFOL;  Surgeon: Iva Boop, MD;  Location: [facility] ENDOSCOPY;  Service: Endoscopy;  Laterality: N/A;   Family History  Problem Relation Age of Onset  . Cancer Mother     hx breast cancer  . Ulcerative colitis Mother    History   Social History  . Marital Status: Single    Spouse Name: N/A    Number of Children: N/A  . Years of Education: N/A   Occupational History  . Not on file.   Social History Main Topics  . Smoking status: Never Smoker   . Smokeless tobacco: Not on file  . Alcohol Use: No  . Drug Use: No  . Sexual Activity: Not on file   Other Topics Concern  . Not on file   Social History Narrative  . No narrative on file   Allergies: Prednisone  Home Meds: Prior to Admission medications   Medication Sig Start Date End Date Taking? Authorizing Provider  cetirizine (ZYRTEC) 10 MG tablet Take 10 mg by mouth daily.   Yes Historical Provider, MD  dicyclomine (BENTYL) 20 MG tablet Take 1 tablet (20 mg total) by mouth 3 (three) times daily before meals. 06/28/14  Yes Shanker Levora Dredge, MD  ibuprofen (ADVIL,MOTRIN) 200 MG tablet Take 200 mg by mouth every 6 (six) hours as needed for mild pain.    Yes Historical Provider, MD  ISOtretinoin (ACCUTANE) 40 MG capsule Take 40 mg by mouth daily.   Yes Historical Provider, MD    Physical Exam: Blood  pressure 119/61, pulse 60, temperature 97.6 F (36.4 C), temperature source Oral, resp. rate 13, SpO2 100.00%. General: Awake, Oriented x3, No acute distress. HEENT: EOMI, Moist mucous membranes Neck: Supple CV: S1 and S2 Lungs: Clear to ascultation bilaterally Abdomen: Soft, Nontender, Nondistended, hypoactive bowel sounds. Ext: Good pulses. Trace edema. No clubbing or cyanosis noted. Neuro: Cranial Nerves II-XII grossly intact. Has 5/5 motor strength in upper and lower extremities.  Lab results:  Recent Labs  06/27/14 0500 06/29/14 1444  NA  139 139  K 4.0 4.7  CL 100 99  CO2 30 25  GLUCOSE 98 93  BUN 9 13  CREATININE 0.92 1.17  CALCIUM 8.7 9.5    Recent Labs  06/29/14 1444  AST 20  ALT 26  ALKPHOS 109  BILITOT 0.9  PROT 7.5  ALBUMIN 3.7    Recent Labs  06/29/14 1444  LIPASE 95*    Recent Labs  06/27/14 0500 06/29/14 1444  WBC 11.9* 18.1*  NEUTROABS  --  15.0*  HGB 13.5 16.0  HCT 41.0 47.2  MCV 87.0 88.2  PLT 358 498*   No results found for this basename: CKTOTAL, CKMB, CKMBINDEX, TROPONINI,  in the last 72 hours No components found with this basename: POCBNP,  No results found for this basename: DDIMER,  in the last 72 hours No results found for this basename: HGBA1C,  in the last 72 hours No results found for this basename: CHOL, HDL, LDLCALC, TRIG, CHOLHDL, LDLDIRECT,  in the last 72 hours No results found for this basename: TSH, T4TOTAL, FREET3, T3FREE, THYROIDAB,  in the last 72 hours No results found for this basename: VITAMINB12, FOLATE, FERRITIN, TIBC, IRON, RETICCTPCT,  in the last 72 hours Imaging results:  Ct Abdomen Pelvis W Contrast  06/19/2014   CLINICAL DATA:  Low abdominal pain and fever for 4 days. Nausea and vomiting.  EXAM: CT ABDOMEN AND PELVIS WITH CONTRAST  TECHNIQUE: Multidetector CT imaging of the abdomen and pelvis was performed using the standard protocol following bolus administration of intravenous contrast.  CONTRAST:  OMNIPAQUE IOHEXOL 300 MG/ML  SOLN  COMPARISON:  None.  FINDINGS: The liver, spleen, gallbladder, pancreas, adrenal glands, kidneys, abdominal aorta, inferior vena cava, and retroperitoneal lymph nodes are unremarkable. Small accessory spleen. Air-fluid levels in the colon consistent with liquid stool. No colonic distention or wall thickening. Small bowel are mildly distended proximally with distal decompression. Wall thickening is suggested in distal small bowel. Changes could represent infectious enteritis or inflammatory enteritis such as Crohn's disease.  No definite abscess. There is mild edema/stranding in the mesenteric. Moderately prominent mesenteric lymph nodes are likely reactive. No free air or free fluid in the abdomen.  Pelvis: Bladder wall is not thickened. No free or loculated pelvic fluid collections. Mild thickening of decompressed sigmoid colonic wall. No destructive bone lesions.  IMPRESSION: Wall thickening in the distal small bowel with mesenteric edema and prominent mesenteric lymph nodes. Dilatation of proximal small bowel. Changes may represent infectious or inflammatory enteritis. Partial small bowel obstruction due to stricturing is not excluded. Sigmoid colon also appears thickened, also possibly infectious or inflammatory.   Electronically Signed   By: Burman Nieves M.D.   On: 06/19/2014 23:14   Dg Small Bowel  06/23/2014   CLINICAL DATA:  23 year old male with Crohn's disease, abdominal and pelvic pain, nausea vomiting and diarrhea.  EXAM: SMALL BOWEL SERIES  COMPARISON:  06/19/2014 CT  TECHNIQUE: Following ingestion of thin barium, serial small bowel images were obtained.  FLUOROSCOPY  TIME:  0 min 0 seconds  FINDINGS: A scout film of the abdomen demonstrates distended small bowel loops. Gas in the colon and a small amount a gas in the rectum noted.  After administration of oral contrast, contrast flows freely into the proximal small bowel. Following this, very slow progression of oral contrast is noted with distended mid small bowel loops noted. Contrast reaches the colon by 5 hr. Contrast within the distal small bowel is very dilute and therefore spot compression view was not performed.  IMPRESSION: Distended proximal small bowel loops with very slow progression of contrast into the colon. This likely represents a partial small bowel obstruction. Spot compression views were not obtained given very diluted contrast within the distal small bowel.   Electronically Signed   By: Laveda Abbe M.D.   On: 06/23/2014 22:51   Dg Abd 2  Views  06/29/2014   CLINICAL DATA:  Generalized abdominal pain. nausea and vomiting. Diarrhea. One day status post colonoscopy.  EXAM: ABDOMEN - 2 VIEW  COMPARISON:  06/27/2014  FINDINGS: Multiple mildly dilated small bowel loops are again seen in the central abdomen containing air-fluid levels. Colon is nondilated and contains some gas and contrast material. This could be due to a small bowel ileus or partial small bowel obstruction. There is no evidence of free air.  IMPRESSION: Small bowel ileus versus partial small bowel obstruction. No evidence of free air.   Electronically Signed   By: Myles Rosenthal M.D.   On: 06/29/2014 18:59   Dg Abd 2 Views  06/27/2014   CLINICAL DATA:  Abdominal pain.  EXAM: ABDOMEN - 2 VIEW  COMPARISON:  June 26, 2014.  FINDINGS: Nasogastric tube tip is seen in proximal stomach. Residual contrast remains within the colon which is slightly decreased compared to prior exam. No abnormal bowel gas pattern is noted.  IMPRESSION: No evidence of bowel obstruction or ileus. Residual contrast remains within the colon.   Electronically Signed   By: Roque Lias M.D.   On: 06/27/2014 09:53   Dg Abd 2 Views  06/26/2014   CLINICAL DATA:  Abdominal pain, suspected small bowel obstruction ; assess nasogastric tube positioning  EXAM: ABDOMEN - 2 VIEW  COMPARISON:  Portable chest x-ray of June 25, 2014  FINDINGS: There are loops of mildly distended small bowel in the left mid abdomen. These have decreased in conspicuity since the earlier study. There remains distention of the ascending and transverse and proximal descending colons with contrast and gas from the CT scan of September 7th. The nasogastric tube proximal port lies at the level of the GE junction. The lung bases are clear.  IMPRESSION: 1. There persistently dilated loops of small bowel which have become less conspicuous which may indicate resolving distal small bowel obstruction. 2. The nasogastric tubes proximal port lies at the  level of the GE junction and advancement by at least 5 cm is recommended. 3. There remains mild gaseous distention of the ascending and transverse and proximal descending portions of the colon.   Electronically Signed   By: David  [place]   On: 06/26/2014 08:17   Dg Abd 2 Views  06/25/2014   CLINICAL DATA:  Abdominal pain.  EXAM: ABDOMEN - 2 VIEW  COMPARISON:  06/23/2014.  FINDINGS: Compared to the prior study, the oral contrast material is now noted in the colon, predominantly within the cecum. Several loops of gas-filled small bowel remain dilated measuring up to 5.3 cm in diameter in the left upper quadrant noted  on today's study. No pneumoperitoneum.  IMPRESSION: 1. Findings remain compatible with partial small bowel obstruction, as above. 2. No pneumoperitoneum.   Electronically Signed   By: Trudie Reed M.D.   On: 06/25/2014 10:42   Dg Abd 2 Views  06/23/2014   CLINICAL DATA:  Abdominal pain  EXAM: ABDOMEN - 2 VIEW  COMPARISON:  06/19/2014  FINDINGS: Scattered large and small bowel gas is identified. Dilatation of small bowel is again identified but the colonic gas would indicate more of a partial small bowel obstruction or ileus. Correlation with physical exam is recommended. The overall appearance is stable from that seen on recent CT. No acute mass is noted. No abnormal calcifications or abnormal bony changes are noted.  IMPRESSION: Persistent small bowel dilatation stable from the prior exam. This likely represents a partial small bowel obstruction or ileus. Correlation with the physical exam is recommended.   Electronically Signed   By: Alcide Clever M.D.   On: 06/23/2014 07:45   Dg Abd Portable 1v  06/25/2014   CLINICAL DATA:  Nasogastric tube placement.  EXAM: PORTABLE ABDOMEN - 1 VIEW  COMPARISON:  Abdominal radiograph performed earlier today at 9:24 a.m.  FINDINGS: Distended air-filled loops of small bowel are now seen throughout the abdomen and pelvis, more prominent than on prior studies.  Air is also seen partially filling the colon, with contrast again noted along the ascending and transverse colon. The appearance raises suspicion for ileus, although partial small bowel obstruction remains a possibility.  The patient's enteric tube is seen ending overlying the body of the stomach, with the side port also ending overlying the body of the stomach. No definite free intra-abdominal air is identified, though evaluation for free air is limited on a single supine view.  No acute osseous abnormalities are seen.  IMPRESSION: 1. Enteric tube seen ending overlying the body of the stomach, with the side port also ending overlying the body of the stomach. 2. Mildly more prominent diffuse distention of small bowel loops within the abdomen and pelvis. The appearance raises suspicion for ileus, though partial small bowel obstruction remains a possibility. Contrast is again seen filling the ascending and transverse colon. No free intra-abdominal air seen.   Electronically Signed   By: Roanna Raider M.D.   On: 06/25/2014 23:28    Assessment & Plan by Problem: Nausea, vomiting, diarrhea, abdominal cramps, partial small bowel obstruction versus ileus on imaging Patient has recurrent episode.  Unclear etiology.  Was presumed to be due to enteritis.  Hydrate the patient on IV fluids, supportive management.  Requested Emergency Department to consult general surgery in the event that his symptoms were to get worse.  I discussed with, Dr. Arlyce Dice, gastroenterology for further evaluation in the morning given recurrent episodes.  Recent history of enteritis with ileus versus partial small bowel obstruction Management as indicated above.  Prophylaxis Lovenox.  CODE STATUS Full code.  Disposition Admit the patient to medical bed as inpatient.  Time spent on admission, talking to the patient, and coordinating care was: 50 mins.  Donnita Farina A, MD 06/29/2014, 8:38 PM

## 2014-06-29 NOTE — Progress Notes (Signed)
Received report on pt from ED. Nurse stated pt was going to CT before coming to floor. Nelda Marseille, RN

## 2014-06-29 NOTE — ED Notes (Signed)
Pt completed drinking oral contrast.  To CT, then to floor with EMT accompanying.

## 2014-06-29 NOTE — ED Notes (Signed)
Pt. Is aware of needing urine specimen.   

## 2014-06-29 NOTE — Progress Notes (Signed)
Patient ID: Blake Bradley, male   DOB: 1991-03-29, 23 y.o.   MRN: [id number] CT was reviewed with the radiologist. In retrospect it appears as though this may have been appendicitis with perforation. Fortunately on his current scan he has significantly less inflammation and dilation of small bowel. Given that this has been going on for 2 weeks and he is doing well hemodynamically and improving radiographically and since his abdomen is very soft and nontender I think it would be reasonable to continue to treat him with abx. If he can continue to improve over the next several weeks then he may be a candidate for an elective appendectomy with less risk of other injury and less risk of needing an ostomy. We will follow with you very closely.

## 2014-06-29 NOTE — Progress Notes (Addendum)
Went to pt room to introduce with pt. Pt is A&O and no  S/S of distress noted.Discussed with pt's concerns . Call bell is placed within pt's reach. Will cont to monitor.

## 2014-06-29 NOTE — Progress Notes (Signed)
Notified Claiborne Billings, NP of the results of the CT of abdomen tonight showing probable perforated appendix with small adjacent 1.6 cm x1.3cm abscess. NP stated to call surgeon on call. Notified Dr. Carolynne Edouard of results of the CT of abdomen tonight. Dr. Carolynne Edouard to notify Claiborne Billings, NP and tell him that he recommended antibiotics and they would see patient in the morning again. Text paged Claiborne Billings, NP with this information. NP entered orders for  IV antibiotics. Will continue to monitor pt. Nelda Marseille, RN

## 2014-06-30 ENCOUNTER — Inpatient Hospital Stay (HOSPITAL_COMMUNITY): Payer: Medicaid Other

## 2014-06-30 DIAGNOSIS — D72829 Elevated white blood cell count, unspecified: Secondary | ICD-10-CM

## 2014-06-30 DIAGNOSIS — K36 Other appendicitis: Secondary | ICD-10-CM

## 2014-06-30 DIAGNOSIS — R1084 Generalized abdominal pain: Secondary | ICD-10-CM

## 2014-06-30 LAB — CBC
HEMATOCRIT: 40.7 % (ref 39.0–52.0)
Hemoglobin: 13.6 g/dL (ref 13.0–17.0)
MCH: 29.2 pg (ref 26.0–34.0)
MCHC: 33.4 g/dL (ref 30.0–36.0)
MCV: 87.5 fL (ref 78.0–100.0)
Platelets: 378 10*3/uL (ref 150–400)
RBC: 4.65 MIL/uL (ref 4.22–5.81)
RDW: 13.1 % (ref 11.5–15.5)
WBC: 6.9 10*3/uL (ref 4.0–10.5)

## 2014-06-30 LAB — URINE MICROSCOPIC-ADD ON

## 2014-06-30 LAB — URINALYSIS, ROUTINE W REFLEX MICROSCOPIC
Bilirubin Urine: NEGATIVE
Glucose, UA: NEGATIVE mg/dL
Ketones, ur: NEGATIVE mg/dL
LEUKOCYTES UA: NEGATIVE
NITRITE: NEGATIVE
Protein, ur: NEGATIVE mg/dL
Specific Gravity, Urine: 1.04 — ABNORMAL HIGH (ref 1.005–1.030)
UROBILINOGEN UA: 0.2 mg/dL (ref 0.0–1.0)
pH: 5 (ref 5.0–8.0)

## 2014-06-30 LAB — BASIC METABOLIC PANEL
ANION GAP: 14 (ref 5–15)
BUN: 10 mg/dL (ref 6–23)
CALCIUM: 8.9 mg/dL (ref 8.4–10.5)
CO2: 26 meq/L (ref 19–32)
CREATININE: 1.12 mg/dL (ref 0.50–1.35)
Chloride: 101 mEq/L (ref 96–112)
GFR calc Af Amer: >90 mL/min (ref >90)
GFR calc non Af Amer: >90 mL/min (ref >90)
Glucose, Bld: 80 mg/dL (ref 70–99)
Potassium: 4.8 mEq/L (ref 3.7–5.3)
Sodium: 141 mEq/L (ref 137–147)

## 2014-06-30 MED ORDER — CETYLPYRIDINIUM CHLORIDE 0.05 % MT LIQD: 7.0000 mL | Freq: Two times a day (BID) | OROMUCOSAL | Status: DC

## 2014-06-30 MED ORDER — CHLORHEXIDINE GLUCONATE 0.12 % MT SOLN
15.0000 mL | Freq: Two times a day (BID) | OROMUCOSAL | Status: DC
  Administered 2014-07-01: 15 mL via OROMUCOSAL
  Filled 2014-06-30 (×5): qty 15

## 2014-06-30 NOTE — Progress Notes (Signed)
[facility] Progress Note     Subjective: Pt feels much better today, pain minimal throbbing pain at rest.  No N/V.  Hungry/thirsty.  Ambulating well.  +flatus, no BM yet.  Objective: Vital signs in last 24 hours: Temp:  [97.6 F (36.4 C)-98.9 F (37.2 C)] 97.6 F (36.4 C) (09/18 0558) Pulse Rate:  [57-89] 65 (09/18 0558) Resp:  [13-25] 18 (09/18 0558) BP: (111-132)/(53-72) 122/66 mmHg (09/18 0558) SpO2:  [98 %-100 %] 100 % (09/18 0558) Weight:  [150 lb 11.2 oz (68.357 kg)-153 lb 14.1 oz (69.8 kg)] 150 lb 11.2 oz (68.357 kg) (09/18 0500) Last BM Date: 06/29/14  Intake/Output from previous day: 09/17 0701 - 09/18 0700 In: 3383.3 [P.O.:250; I.V.:3033.3; IV Piggyback:100] Out: 400 [Urine:400] Intake/Output this shift:    PE: Gen:  Alert, NAD, pleasant Abd: Soft, minimal tenderness, ND, +BS, no HSM   Lab Results:   Recent Labs  06/29/14 1444 06/30/14 0658  WBC 18.1* 6.9  HGB 16.0 13.6  HCT 47.2 40.7  PLT 498* 378   BMET  Recent Labs  06/29/14 1444 06/30/14 0658  NA 139 141  K 4.7 4.8  CL 99 101  CO2 25 26  GLUCOSE 93 80  BUN 13 10  CREATININE 1.17 1.12  CALCIUM 9.5 8.9   PT/INR No results found for this basename: LABPROT, INR,  in the last 72 hours CMP     Component Value Date/Time   NA 141 06/30/2014 0658   K 4.8 06/30/2014 0658   CL 101 06/30/2014 0658   CO2 26 06/30/2014 0658   GLUCOSE 80 06/30/2014 0658   BUN 10 06/30/2014 0658   CREATININE 1.12 06/30/2014 0658   CALCIUM 8.9 06/30/2014 0658   PROT 7.5 06/29/2014 1444   ALBUMIN 3.7 06/29/2014 1444   AST 20 06/29/2014 1444   ALT 26 06/29/2014 1444   ALKPHOS 109 06/29/2014 1444   BILITOT 0.9 06/29/2014 1444   GFRNONAA >90 06/30/2014 0658   GFRAA >90 06/30/2014 0658   Lipase     Component Value Date/Time   LIPASE 95* 06/29/2014 1444       Studies/Results: Abd 1 View (kub)  06/30/2014   CLINICAL DATA:  Partial SBO, abdominal pain, followup  EXAM: ABDOMEN - 1 VIEW  COMPARISON:  CT  abdomen pelvis of 06/29/2014  FINDINGS: Both large and small bowel gas is present without significant obstruction. This pattern may represent ileus. There is some contrast scattered throughout the nondistended colon. No opaque calculi are seen.  IMPRESSION: Both large and small bowel gas without definite obstruction. Possible mild ileus.   Electronically Signed   By: Dwyane Dee M.D.   On: 06/30/2014 08:07   Ct Abdomen Pelvis W Contrast  06/29/2014   CLINICAL DATA:  Abdomen pain. The patient wrist recently admitted for small bowel obstruction the was sent home yesterday. The abdomen pain returned.  EXAM: CT ABDOMEN AND PELVIS WITH CONTRAST  TECHNIQUE: Multidetector CT imaging of the abdomen and pelvis was performed using the standard protocol following bolus administration of intravenous contrast.  CONTRAST:  OMNIPAQUE IOHEXOL 300 MG/ML  SOLN  COMPARISON:  June 19, 2014 CT abdomen and pelvis. Abdominal x-ray June 29, 2014  FINDINGS: There is persistent but decreased inflammation predominantly centered around was appears to be the appendix with small adjacent 1.6 x 1.3 cm abscess. There is decreased inflammation surrounding the small bowel loops in the right lower quadrant. There are dilated small bowel loops throughout the abdomen but the number and caliber  of dilated small bowel loops are decreased compared to the previous exam. There is air and residual contrast in the decompressed colon.  There is focal fatty infiltration of the liver around the falciform ligament. There is a 5 mm low density in the right lobe liver probably a small cyst. The liver is otherwise unremarkable the spleen, pancreas, gallbladder, adrenal glands and kidneys are normal. There is no hydronephrosis bilaterally. The aorta is normal.  Fluid-filled bladder is normal. Pelvic phleboliths are noted. Images of the visualized lung bases are clear. No acute abnormality is identified within the visualized bones.  IMPRESSION:  Findings probably due to perforated appendix with small adjacent 1.6 x 1.3 cm abscess. There is decreased inflammation surrounding the small bowel loops in the right lower quadrant. There are dilated small bowel loops throughout the abdomen but the number and caliber of dilated small bowel loops are decreased compared to the previous exam.   Electronically Signed   By: Sherian Rein M.D.   On: 06/29/2014 22:11   Dg Abd 2 Views  06/29/2014   CLINICAL DATA:  Generalized abdominal pain. nausea and vomiting. Diarrhea. One day status post colonoscopy.  EXAM: ABDOMEN - 2 VIEW  COMPARISON:  06/27/2014  FINDINGS: Multiple mildly dilated small bowel loops are again seen in the central abdomen containing air-fluid levels. Colon is nondilated and contains some gas and contrast material. This could be due to a small bowel ileus or partial small bowel obstruction. There is no evidence of free air.  IMPRESSION: Small bowel ileus versus partial small bowel obstruction. No evidence of free air.   Electronically Signed   By: Myles Rosenthal M.D.   On: 06/29/2014 18:59    Anti-infectives: Anti-infectives   Start     Dose/Rate Route Frequency Ordered Stop   06/29/14 2300  piperacillin-tazobactam (ZOSYN) IVPB 3.375 g     3.375 g 12.5 mL/hr over 240 Minutes Intravenous 3 times per day 06/29/14 2247     06/29/14 2245  metroNIDAZOLE (FLAGYL) IVPB 500 mg  Status:  Discontinued     500 mg 100 mL/hr over 60 Minutes Intravenous Every 8 hours 06/29/14 2241 06/29/14 2303       Assessment/Plan Subacute perforated appendicitis Leukocytosis - resolved  Plan: 1.  Leukocytosis resolved.  Pt clinically improving.  Conservative management with plan for interval appendectomy in several weeks down the road once the infection has resolved when there is less risk of requiring an ostomy. 2.  IV antibiotics (Zosyn Day #2), IVF, pain control, antiemetics 3.  Ambulate and IS 4.  SCD's and lovenox 5.  Advance diet to clears       LOS: 1 day    DORT, Kristan Brummitt 06/30/2014, 10:00 AM Pager: [phone]

## 2014-06-30 NOTE — Progress Notes (Signed)
Pt admitted to 5w23 from ED. Pt is A&Ox4. Pt lives at home with parents. Pt's skin is warm, dry and intact. Pt's dad at bedside. Pt oriented to unit and room. Will continue to monitor pt. Nelda Marseille, RN

## 2014-06-30 NOTE — Care Management Note (Signed)
    Page 1 of 1   06/30/2014     4:09:12 PM CARE MANAGEMENT NOTE 06/30/2014  Patient:  Blake Bradley, Blake Bradley   Account Number:  [id number]  Date Initiated:  06/30/2014  Documentation initiated by:  Letha Cape  Subjective/Objective Assessment:   dx appendicitis(peforation)  admit- lives with parents. pta indep.     Action/Plan:   Anticipated DC Date:  07/03/2014   Anticipated DC Plan:  HOME/[facility] C[facility]      DC Planning Services  CM consult      Choice offered to / List presented to:             Status of service:  In process, will continue to follow Medicare Important Message given?  NO (If response is "NO", the following Medicare IM given date fields will be blank) Date Medicare IM given:   Medicare IM given by:   Date Additional Medicare IM given:   Additional Medicare IM given by:    Discharge Disposition:    Per UR Regulation:    If discussed at Long Length of Stay Meetings, dates discussed:    Comments:  06/30/14 1607 Letha Cape RN, BSN [phone] patient lives with parents, father wanted to speak with financial counserlor, they spoke with patient's mom and advised her what they need to do about applying for retro medicaid.

## 2014-06-30 NOTE — Progress Notes (Signed)
Continue medical management at this point.  Can get interval appendectomy down the road hopefully.

## 2014-06-30 NOTE — Consult Note (Signed)
Consultation  Referring Provider:   [facility] Surgery    Primary Care Physician:  Nadean Corwin, MD Primary Gastroenterologist:  Yancey Flemings, MD       Reason for Consultation:  Persistent nausea, vomiting, abdominal pain.            HPI:   Blake Bradley is a 23 y.o. male who was hospitalized for about 10 days this month with nausea, vomiting, diarrhea and abdominal pain. CTscan revealed thickened distal small bowel with ? partial obstruction. There was also question of sigmoid thickening. Stool pathogen panel was negative. Patient was treated with NGT decompression and antibiotics.  Clinical picture was felt to be c/w small bowel enteritis but since mother has IBD patient ultimately underwent inpatient colonoscopy with TI intubation which was normal. Patient was discharged hom 06/28/14 but returned to ED less than 24 hours later with recurrent nausea, vomiting, diarrhea and abdominal pain.  CTscan last night shows persistent but decreased inflammation around appendix with small abscess felt to due to perforated appendix. Dilated small bowel loops have improved  Past Medical History  Diagnosis Date  . Allergy   . ADD (attention deficit disorder)   . Family history of anesthesia complication     "grandmother had problems w/breathing" 06/29/2014)  . Complication of anesthesia 06/28/2014    "HR dropped, couldn't get BP up when put to sleep for colonscopy"  . WJXBJYNW(295.6)     "maybe monthly" (06/29/2014)  . Bipolar disorder   . Inflammation of small intestine 06/2014 X 2    Past Surgical History  Procedure Laterality Date  . 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 Colonoscopy with propofol N/A 06/28/2014    Procedure: COLONOSCOPY WITH PROPOFOL;  Surgeon: Iva Boop, MD;  Location: [facility] ENDOSCOPY;  Service: Endoscopy;  Laterality: N/A;    Family History  Problem Relation Age of Onset  . Cancer Mother     hx breast cancer  . Ulcerative colitis Mother       History  Substance Use Topics  . Smoking status: Never Smoker   . Smokeless tobacco: Never Used  . Alcohol Use: No    Prior to Admission medications   Medication Sig Start Date End Date Taking? Authorizing Provider  cetirizine (ZYRTEC) 10 MG tablet Take 10 mg by mouth daily.   Yes Historical Provider, MD  dicyclomine (BENTYL) 20 MG tablet Take 1 tablet (20 mg total) by mouth 3 (three) times daily before meals. 06/28/14  Yes Shanker Levora Dredge, MD  ibuprofen (ADVIL,MOTRIN) 200 MG tablet Take 200 mg by mouth every 6 (six) hours as needed for mild pain.    Yes Historical Provider, MD  ISOtretinoin (ACCUTANE) 40 MG capsule Take 40 mg by mouth daily.   Yes Historical Provider, MD    Current Facility-Administered Medications  Medication Dose Route Frequency Provider Last Rate Last Dose  . 0.9 %  sodium chloride infusion   Intravenous Continuous Cristal Ford, MD 125 mL/hr at 06/30/14 [phone]    . acetaminophen (TYLENOL) tablet 650 mg  650 mg Oral Q6H PRN Cristal Ford, MD       Or  . acetaminophen (TYLENOL) suppository 650 mg  650 mg Rectal Q6H PRN Cristal Ford, MD      . antiseptic oral rinse (CPC / CETYLPYRIDINIUM CHLORIDE 0.05%) solution 7 mL  7 mL Mouth Rinse q12n4p Srikar Cherlynn Kaiser, MD      . chlorhexidine (PERIDEX) 0.12 % solution 15  mL  15 mL Mouth Rinse BID Cristal Ford, MD      . dicyclomine (BENTYL) tablet 20 mg  20 mg Oral TID AC Cristal Ford, MD   20 mg at 06/30/14 0814  . enoxaparin (LOVENOX) injection 40 mg  40 mg Subcutaneous Q24H Cristal Ford, MD   40 mg at 06/29/14 2152  . HYDROmorphone (DILAUDID) injection 1 mg  1 mg Intravenous Q4H PRN Cristal Ford, MD   1 mg at 06/30/14 0330  . ondansetron (ZOFRAN) tablet 4 mg  4 mg Oral Q6H PRN Cristal Ford, MD       Or  . ondansetron (ZOFRAN) injection 4 mg  4 mg Intravenous Q6H PRN Cristal Ford, MD      . piperacillin-tazobactam (ZOSYN) IVPB 3.375 g  3.375 g Intravenous 3 times per day Marquita Palms, RPH   3.375 g at 06/30/14  0534  . promethazine (PHENERGAN) tablet 12.5 mg  12.5 mg Oral Q6H PRN Cristal Ford, MD       Or  . promethazine (PHENERGAN) injection 12.5 mg  12.5 mg Intravenous Q6H PRN Cristal Ford, MD       Or  . promethazine (PHENERGAN) suppository 12.5 mg  12.5 mg Rectal Q6H PRN Cristal Ford, MD        Allergies as of 06/29/2014 - Review Complete 06/29/2014  Allergen Reaction Noted  . Prednisone Hives 06/19/2014    Review of Systems:    All systems reviewed and negative except where noted in HPI.   Physical Exam:  Vital signs in last 24 hours: Temp:  [97.6 F (36.4 C)-98.9 F (37.2 C)] 97.6 F (36.4 C) (09/18 0558) Pulse Rate:  [57-89] 65 (09/18 0558) Resp:  [13-25] 18 (09/18 0558) BP: (111-132)/(53-72) 122/66 mmHg (09/18 0558) SpO2:  [98 %-100 %] 100 % (09/18 0558) Weight:  [150 lb 11.2 oz (68.357 kg)-153 lb 14.1 oz (69.8 kg)] 150 lb 11.2 oz (68.357 kg) (09/18 0500) Last BM Date: 06/29/14 General:   Pleasant thin white male in NAD. Father at bedside. Head:  Normocephalic and atraumatic. Eyes:   No icterus.   Conjunctiva pink. Ears:  Normal auditory acuity. Neck:  Supple; no masses felt Lungs:  Respirations even and unlabored. Lungs clear to auscultation bilaterally.   No wheezes, crackles, or rhonchi.  Heart:  Regular rate and rhythm;  murmur heard. Abdomen:  Soft, nondistended, mild RLQ tenderness. A few bowel sounds. No appreciable masses or hepatomegaly.  Rectal:  Not performed.  Msk:  Symmetrical without gross deformities.  Extremities:  Without edema. Neurologic:  Alert and  oriented x4;  grossly normal neurologically. Skin:  Intact without significant lesions or rashes. Cervical Nodes:  No significant cervical adenopathy. Psych:  Alert and cooperative. Normal affect.  LAB RESULTS:  Recent Labs  06/29/14 1444 06/30/14 0658  WBC 18.1* 6.9  HGB 16.0 13.6  HCT 47.2 40.7  PLT 498* 378   BMET  Recent Labs  06/29/14 1444 06/30/14 0658  NA 139 141  K 4.7 4.8    CL 99 101  CO2 25 26  GLUCOSE 93 80  BUN 13 10  CREATININE 1.17 1.12  CALCIUM 9.5 8.9   LFT  Recent Labs  06/29/14 1444  PROT 7.5  ALBUMIN 3.7  AST 20  ALT 26  ALKPHOS 109  BILITOT 0.9   STUDIES: Abd 1 View (kub)  06/30/2014   CLINICAL DATA:  Partial SBO, abdominal pain, followup  EXAM: ABDOMEN - 1 VIEW  COMPARISON:  CT abdomen pelvis of 06/29/2014  FINDINGS: Both large and small bowel gas is present without significant obstruction. This pattern may represent ileus. There is some contrast scattered throughout the nondistended colon. No opaque calculi are seen.  IMPRESSION: Both large and small bowel gas without definite obstruction. Possible mild ileus.   Electronically Signed   By: Dwyane Dee M.D.   On: 06/30/2014 08:07   Ct Abdomen Pelvis W Contrast  06/29/2014   CLINICAL DATA:  Abdomen pain. The patient wrist recently admitted for small bowel obstruction the was sent home yesterday. The abdomen pain returned.  EXAM: CT ABDOMEN AND PELVIS WITH CONTRAST  TECHNIQUE: Multidetector CT imaging of the abdomen and pelvis was performed using the standard protocol following bolus administration of intravenous contrast.  CONTRAST:  OMNIPAQUE IOHEXOL 300 MG/ML  SOLN  COMPARISON:  June 19, 2014 CT abdomen and pelvis. Abdominal x-ray June 29, 2014  FINDINGS: There is persistent but decreased inflammation predominantly centered around was appears to be the appendix with small adjacent 1.6 x 1.3 cm abscess. There is decreased inflammation surrounding the small bowel loops in the right lower quadrant. There are dilated small bowel loops throughout the abdomen but the number and caliber of dilated small bowel loops are decreased compared to the previous exam. There is air and residual contrast in the decompressed colon.  There is focal fatty infiltration of the liver around the falciform ligament. There is a 5 mm low density in the right lobe liver probably a small cyst. The liver is  otherwise unremarkable the spleen, pancreas, gallbladder, adrenal glands and kidneys are normal. There is no hydronephrosis bilaterally. The aorta is normal.  Fluid-filled bladder is normal. Pelvic phleboliths are noted. Images of the visualized lung bases are clear. No acute abnormality is identified within the visualized bones.  IMPRESSION: Findings probably due to perforated appendix with small adjacent 1.6 x 1.3 cm abscess. There is decreased inflammation surrounding the small bowel loops in the right lower quadrant. There are dilated small bowel loops throughout the abdomen but the number and caliber of dilated small bowel loops are decreased compared to the previous exam.   Electronically Signed   By: Sherian Rein M.D.   On: 06/29/2014 22:11    PREVIOUS ENDOSCOPIES:            Normal colonoscopy within last 10 days   Impression / Plan:    23 year old male with recent prolonged hospital stay for nausea, vomiting, abdominal pain and loose stool felt to be small bowel enteritis with resultant partial distal small bowel obstruction. Inpatient colonoscopy with TI intubation was normal. He improved with antibiotics and NGT decompression. Now back less than 24 hours later with recurrent symptoms. CTscan last night suggests perforated appendix with small abscess.  Surgery has admitted him for IV antibiotics with plans for elective appendectomy in the next several weeks if patient continues to improve.    Thanks   LOS: 1 day   Willette Cluster  06/30/2014, 10:21 AM     GI Attending  I have also seen and assessed the patient and agree with the above note. New information and comparison indicates a perforated appendicitis Agree w/ Abx and elective appendectomy. Further management per [facility] We will be available if needed.  Iva Boop, MD, [facility] Gastroenterology [phone] (pager) 06/30/2014 5:17 PM

## 2014-06-30 NOTE — Progress Notes (Signed)
Patient Demographics  Blake Bradley, is a 23 y.o. male, DOB - 01-Jul-1991, ZOX:[id number]  Admit date - 06/29/2014   Admitting Physician Cristal Ford, MD  Outpatient Primary MD for the patient is Nadean Corwin, MD  LOS - 1   Chief Complaint  Patient presents with  . Abdominal Pain      Brief narrative: Patient presents with complaints of abdominal pain, nausea, vomiting, recently discharged from [facility] after a long stay for similar complaints, repeat CT abdomen showing possible perforated appendix with abscess, a shunt was seen by surgical service, with recommendation of conservative management with IV antibiotics currently to treat subacute perforated appendicitis, with elective surgery in few weeks.  Subjective:   Blake Bradley today has, No headache, No chest pain, No abdominal pain - No Nausea, No new weakness tingling or numbness, No Cough - SOB.   Assessment & Plan    Principal Problem:   Subacute appendicitis Active Problems:   Leukocytosis, unspecified   Abdominal pain   Nausea and vomiting  1-Subacute perforated appendicitis - Continue with IV Zosyn, IV fluid, pain medicine, antiemetic, advance diet to clear, continue with conservative management with the plan for interval appendectomy in several weeks as there is less risk of requiring an ostomy.  2-Leukocytosis -Result, do to #1  3-Abdominal pain nausea vomiting Due  to #1, much improved, continue with as needed pain and nausea medicine.  Code Status: Full  Family Communication: no one at bedside.  Disposition Plan: home   Procedures  None   Consults   Surgery  Medications  Scheduled Meds: . antiseptic oral rinse  7 mL Mouth Rinse q12n4p  . chlorhexidine  15 mL Mouth Rinse BID  . dicyclomine  20 mg Oral TID AC  . enoxaparin (LOVENOX) injection  40 mg  Subcutaneous Q24H  . piperacillin-tazobactam (ZOSYN)  IV  3.375 g Intravenous 3 times per day   Continuous Infusions: . sodium chloride 125 mL/hr at 06/30/14 0609   PRN Meds:.acetaminophen, acetaminophen, HYDROmorphone (DILAUDID) injection, ondansetron (ZOFRAN) IV, ondansetron, promethazine, promethazine, promethazine  DVT Prophylaxis  Lovenox - SCDs   Lab Results  Component Value Date   PLT 378 06/30/2014    Antibiotics    Anti-infectives   Start     Dose/Rate Route Frequency Ordered Stop   06/29/14 2300  piperacillin-tazobactam (ZOSYN) IVPB 3.375 g     3.375 g 12.5 mL/hr over 240 Minutes Intravenous 3 times per day 06/29/14 2247     06/29/14 2245  metroNIDAZOLE (FLAGYL) IVPB 500 mg  Status:  Discontinued     500 mg 100 mL/hr over 60 Minutes Intravenous Every 8 hours 06/29/14 2241 06/29/14 2303          Objective:   Filed Vitals:   06/29/14 2045 06/29/14 2140 06/30/14 0500 06/30/14 0558  BP: 132/59 125/53  122/66  Pulse: 60 57  65  Temp:  98.9 F (37.2 C)  97.6 F (36.4 C)  TempSrc:  Oral  Oral  Resp: Height:   (1.88 m)    Weight:  69.8 kg (153 lb 14.1 oz) 68.357 kg (150 lb 11.2 oz)   SpO2: 100% 100%  100%    Wt Readings from Last 3 Encounters:  06/30/14 68.357 kg (150 lb 11.2 oz)  06/20/14 73.71 kg (162 lb 8 oz)  06/20/14 73.71 kg (162 lb 8 oz)     Intake/Output Summary (Last 24 hours) at 06/30/14 1207 Last data filed at 06/30/14 1610  Gross per 24 hour  Intake 3383.33 ml  Output    400 ml  Net 2983.33 ml     Physical Exam  Awake Alert, Oriented X 3, No new F.N deficits, Normal affect [place].AT,PERRAL Supple Neck,No JVD, No cervical lymphadenopathy appriciated.  Symmetrical Chest wall movement, Good air movement bilaterally, CTAB RRR,No Gallops,Rubs or new Murmurs, No Parasternal Heave +ve B.Sounds, Abd Soft, No tenderness, No organomegaly appriciated, No rebound - guarding or rigidity. No Cyanosis, Clubbing or edema, No new Rash or  bruise     Data Review   Micro Results No results found for this or any previous visit (from the past 240 hour(s)).  Radiology Reports Abd 1 View (kub)  06/30/2014   CLINICAL DATA:  Partial SBO, abdominal pain, followup  EXAM: ABDOMEN - 1 VIEW  COMPARISON:  CT abdomen pelvis of 06/29/2014  FINDINGS: Both large and small bowel gas is present without significant obstruction. This pattern may represent ileus. There is some contrast scattered throughout the nondistended colon. No opaque calculi are seen.  IMPRESSION: Both large and small bowel gas without definite obstruction. Possible mild ileus.   Electronically Signed   By: Dwyane Dee M.D.   On: 06/30/2014 08:07   Ct Abdomen Pelvis W Contrast  06/29/2014   CLINICAL DATA:  Abdomen pain. The patient wrist recently admitted for small bowel obstruction the was sent home yesterday. The abdomen pain returned.  EXAM: CT ABDOMEN AND PELVIS WITH CONTRAST  TECHNIQUE: Multidetector CT imaging of the abdomen and pelvis was performed using the standard protocol following bolus administration of intravenous contrast.  CONTRAST:  OMNIPAQUE IOHEXOL 300 MG/ML  SOLN  COMPARISON:  June 19, 2014 CT abdomen and pelvis. Abdominal x-ray June 29, 2014  FINDINGS: There is persistent but decreased inflammation predominantly centered around was appears to be the appendix with small adjacent 1.6 x 1.3 cm abscess. There is decreased inflammation surrounding the small bowel loops in the right lower quadrant. There are dilated small bowel loops throughout the abdomen but the number and caliber of dilated small bowel loops are decreased compared to the previous exam. There is air and residual contrast in the decompressed colon.  There is focal fatty infiltration of the liver around the falciform ligament. There is a 5 mm low density in the right lobe liver probably a small cyst. The liver is otherwise unremarkable the spleen, pancreas, gallbladder, adrenal glands and  kidneys are normal. There is no hydronephrosis bilaterally. The aorta is normal.  Fluid-filled bladder is normal. Pelvic phleboliths are noted. Images of the visualized lung bases are clear. No acute abnormality is identified within the visualized bones.  IMPRESSION: Findings probably due to perforated appendix with small adjacent 1.6 x 1.3 cm abscess. There is decreased inflammation surrounding the small bowel loops in the right lower quadrant. There are dilated small bowel loops throughout the abdomen but the number and caliber of dilated small bowel loops are decreased compared to the previous exam.   Electronically Signed   By: Sherian Rein M.D.   On: 06/29/2014 22:11   Dg Abd 2 Views  06/29/2014   CLINICAL DATA:  Generalized abdominal pain. nausea and vomiting. Diarrhea. One day status post colonoscopy.  EXAM: ABDOMEN - 2 VIEW  COMPARISON:  06/27/2014  FINDINGS: Multiple mildly dilated small bowel loops are again seen in the central abdomen containing air-fluid levels. Colon is nondilated and contains some gas and contrast material. This could be due to a small bowel ileus or partial small bowel obstruction. There is no evidence of free air.  IMPRESSION: Small bowel ileus versus partial small bowel obstruction. No evidence of free air.   Electronically Signed   By: Myles Rosenthal M.D.   On: 06/29/2014 18:59    CBC  Recent Labs Lab 06/25/14 0844 06/26/14 0500 06/27/14 0500 06/29/14 1444 06/30/14 0658  WBC 12.0* 10.8* 11.9* 18.1* 6.9  HGB 13.3 13.7 13.5 16.0 13.6  HCT 39.2 41.1 41.0 47.2 40.7  PLT 323 361 358 498* 378  MCV 85.8 86.5 87.0 88.2 87.5  MCH 29.1 28.8 28.7 29.9 29.2  MCHC 33.9 33.3 32.9 33.9 33.4  RDW 12.9 12.9 12.7 12.9 13.1  LYMPHSABS  --   --   --  1.7  --   MONOABS  --   --   --  0.9  --   EOSABS  --   --   --  0.4  --   BASOSABS  --   --   --  0.0  --     Chemistries   Recent Labs Lab 06/24/14 0628 06/25/14 0844 06/26/14 0500 06/27/14 0500 06/29/14 1444  06/30/14 0658  NA 140 140 140 139 139 141  K 3.5* 3.6* 3.8 4.0 4.7 4.8  CL 102 101 98 100 99 101  CO2 28 27 32 GLUCOSE 90 99 107* 98 93 80  BUN CREATININE 0.83 0.81 0.88 0.92 1.17 1.12  CALCIUM 8.2* 8.6 8.8 8.7 9.5 8.9  MG  --  2.1  --   --   --   --   AST  --   --  18  --  20  --   ALT  --   --  16  --  26  --   ALKPHOS  --   --  95  --  109  --   BILITOT  --   --  0.5  --  0.9  --    ------------------------------------------------------------------------------------------------------------------ estimated creatinine clearance is 101.8 ml/min (by C-G formula based on Cr of 1.12). ------------------------------------------------------------------------------------------------------------------ No results found for this basename: HGBA1C,  in the last 72 hours ------------------------------------------------------------------------------------------------------------------ No results found for this basename: CHOL, HDL, LDLCALC, TRIG, CHOLHDL, LDLDIRECT,  in the last 72 hours ------------------------------------------------------------------------------------------------------------------ No results found for this basename: TSH, T4TOTAL, FREET3, T3FREE, THYROIDAB,  in the last 72 hours ------------------------------------------------------------------------------------------------------------------ No results found for this basename: VITAMINB12, FOLATE, FERRITIN, TIBC, IRON, RETICCTPCT,  in the last 72 hours  Coagulation profile No results found for this basename: INR, PROTIME,  in the last 168 hours  No results found for this basename: DDIMER,  in the last 72 hours  Cardiac Enzymes No results found for this basename: CK, CKMB, TROPONINI, MYOGLOBIN,  in the last 168 hours ------------------------------------------------------------------------------------------------------------------ No components found with this basename: POCBNP,      Time Spent in  minutes   30 minutes   Tannya Gonet M.D on 06/30/2014 at 12:07 PM  Between 7am to 7pm - Pager - [phone]  After 7pm go to [email or web address] - password [email or web address]  And look for the night coverage person covering for me after hours  [facility] Office  [phone]   **Disclaimer: This note may have been dictated with  voice recognition software. Similar sounding words can inadvertently be transcribed and this note may contain transcription errors which may not have been corrected upon publication of note.**

## 2014-07-01 LAB — BASIC METABOLIC PANEL
ANION GAP: 14 (ref 5–15)
BUN: 9 mg/dL (ref 6–23)
CALCIUM: 8.8 mg/dL (ref 8.4–10.5)
CHLORIDE: 99 meq/L (ref 96–112)
CO2: 26 mEq/L (ref 19–32)
Creatinine, Ser: 1.03 mg/dL (ref 0.50–1.35)
GFR calc Af Amer: >90 mL/min (ref >90)
GFR calc non Af Amer: >90 mL/min (ref >90)
GLUCOSE: 66 mg/dL — AB (ref 70–99)
Potassium: 4.3 mEq/L (ref 3.7–5.3)
Sodium: 139 mEq/L (ref 137–147)

## 2014-07-01 LAB — CBC
HEMATOCRIT: 40.7 % (ref 39.0–52.0)
HEMOGLOBIN: 13.5 g/dL (ref 13.0–17.0)
MCH: 28.7 pg (ref 26.0–34.0)
MCHC: 33.2 g/dL (ref 30.0–36.0)
MCV: 86.6 fL (ref 78.0–100.0)
Platelets: 363 10*3/uL (ref 150–400)
RBC: 4.7 MIL/uL (ref 4.22–5.81)
RDW: 12.7 % (ref 11.5–15.5)
WBC: 5.5 10*3/uL (ref 4.0–10.5)

## 2014-07-01 MED ORDER — OXYCODONE-ACETAMINOPHEN 5-325 MG PO TABS
1.0000 | ORAL_TABLET | ORAL | Status: DC | PRN
Start: 2014-07-01 — End: 2014-07-03
  Administered 2014-07-01 – 2014-07-02 (×2): 1 via ORAL
  Filled 2014-07-01 (×2): qty 1

## 2014-07-01 NOTE — Progress Notes (Signed)
Subjective: PAIN INTERMITTENT TOLERATING CLEARS NO NAUSEA OR VOMITING  Objective: Vital signs in last 24 hours: Temp:  [97.7 F (36.5 C)-98.7 F (37.1 C)] 97.8 F (36.6 C) (09/19 0604) Pulse Rate:  [41-50] 41 (09/19 0604) Resp:  [16-18] 18 (09/19 0604) BP: (115-125)/(52-72) 116/52 mmHg (09/19 0604) SpO2:  [100 %] 100 % (09/19 0604) Last BM Date: 06/29/14  Intake/Output from previous day: 09/18 0701 - 09/19 0700 In: 1652.1 [I.V.:1602.1; IV Piggyback:50] Out: -  Intake/Output this shift:    GI: FLAT SOME MILD TTP RLQ NO GUARDING OR REBOUND   Lab Results:   Recent Labs  06/30/14 0658 07/01/14 0640  WBC 6.9 5.5  HGB 13.6 13.5  HCT 40.7 40.7  PLT 378 363   BMET  Recent Labs  06/30/14 0658 07/01/14 0640  NA 141 139  K 4.8 4.3  CL 101 99  CO2 26 26  GLUCOSE 80 66*  BUN 10 9  CREATININE 1.12 1.03  CALCIUM 8.9 8.8   PT/INR No results found for this basename: LABPROT, INR,  in the last 72 hours ABG No results found for this basename: PHART, PCO2, PO2, HCO3,  in the last 72 hours  Studies/Results: Abd 1 View (kub)  06/30/2014   CLINICAL DATA:  Partial SBO, abdominal pain, followup  EXAM: ABDOMEN - 1 VIEW  COMPARISON:  CT abdomen pelvis of 06/29/2014  FINDINGS: Both large and small bowel gas is present without significant obstruction. This pattern may represent ileus. There is some contrast scattered throughout the nondistended colon. No opaque calculi are seen.  IMPRESSION: Both large and small bowel gas without definite obstruction. Possible mild ileus.   Electronically Signed   By: Dwyane Dee M.D.   On: 06/30/2014 08:07   Ct Abdomen Pelvis W Contrast  06/29/2014   CLINICAL DATA:  Abdomen pain. The patient wrist recently admitted for small bowel obstruction the was sent home yesterday. The abdomen pain returned.  EXAM: CT ABDOMEN AND PELVIS WITH CONTRAST  TECHNIQUE: Multidetector CT imaging of the abdomen and pelvis was performed using the standard protocol  following bolus administration of intravenous contrast.  CONTRAST:  OMNIPAQUE IOHEXOL 300 MG/ML  SOLN  COMPARISON:  June 19, 2014 CT abdomen and pelvis. Abdominal x-ray June 29, 2014  FINDINGS: There is persistent but decreased inflammation predominantly centered around was appears to be the appendix with small adjacent 1.6 x 1.3 cm abscess. There is decreased inflammation surrounding the small bowel loops in the right lower quadrant. There are dilated small bowel loops throughout the abdomen but the number and caliber of dilated small bowel loops are decreased compared to the previous exam. There is air and residual contrast in the decompressed colon.  There is focal fatty infiltration of the liver around the falciform ligament. There is a 5 mm low density in the right lobe liver probably a small cyst. The liver is otherwise unremarkable the spleen, pancreas, gallbladder, adrenal glands and kidneys are normal. There is no hydronephrosis bilaterally. The aorta is normal.  Fluid-filled bladder is normal. Pelvic phleboliths are noted. Images of the visualized lung bases are clear. No acute abnormality is identified within the visualized bones.  IMPRESSION: Findings probably due to perforated appendix with small adjacent 1.6 x 1.3 cm abscess. There is decreased inflammation surrounding the small bowel loops in the right lower quadrant. There are dilated small bowel loops throughout the abdomen but the number and caliber of dilated small bowel loops are decreased compared to the previous exam.   Electronically  Signed   By: Sherian Rein M.D.   On: 06/29/2014 22:11   Dg Abd 2 Views  06/29/2014   CLINICAL DATA:  Generalized abdominal pain. nausea and vomiting. Diarrhea. One day status post colonoscopy.  EXAM: ABDOMEN - 2 VIEW  COMPARISON:  06/27/2014  FINDINGS: Multiple mildly dilated small bowel loops are again seen in the central abdomen containing air-fluid levels. Colon is nondilated and contains  some gas and contrast material. This could be due to a small bowel ileus or partial small bowel obstruction. There is no evidence of free air.  IMPRESSION: Small bowel ileus versus partial small bowel obstruction. No evidence of free air.   Electronically Signed   By: Myles Rosenthal M.D.   On: 06/29/2014 18:59    Anti-infectives: Anti-infectives   Start     Dose/Rate Route Frequency Ordered Stop   06/29/14 2300  piperacillin-tazobactam (ZOSYN) IVPB 3.375 g     3.375 g 12.5 mL/hr over 240 Minutes Intravenous 3 times per day 06/29/14 2247     06/29/14 2245  metroNIDAZOLE (FLAGYL) IVPB 500 mg  Status:  Discontinued     500 mg 100 mL/hr over 60 Minutes Intravenous Every 8 hours 06/29/14 2241 06/29/14 2303      Assessment/Plan: COMPLEX PERFORATED APPENDICITIS WITH SMALL ABSCESS CLINICALLY IMPROVED CONT IV ABX  ADVANCE DIET Sunday INTERVAL APPENDECTOMY IN 6 - 8 WEEKS AS LONG AS HE CONTINUES TO IMPROVE HOPEFULLY HOME NEXT WEEK ON po ABX AND FOLLOW UP AS OUTPATIENT.    LOS: 2 days    Blake Bradley A. 07/01/2014

## 2014-07-01 NOTE — Progress Notes (Signed)
Blake Bradley is having some abd. pain 7 on scale 0-10.  Not time for Dilaudid IV, text page MD to see if can we add some pills?  Return called from Dr. Randol Kern & verbal orders given.  Will place orders & continue to monitor.   Forbes Cellar, RN

## 2014-07-01 NOTE — ED Provider Notes (Signed)
Medical screening examination/treatment/procedure(s) were performed by non-physician practitioner and as supervising physician I was immediately available for consultation/collaboration.   EKG Interpretation None        Kristen N Ward, DO 07/01/14 1711 

## 2014-07-01 NOTE — Progress Notes (Signed)
Patient Demographics  Blake Bradley, is a 23 y.o. male, DOB - 05/10/1991, WGN:[id number]  Admit date - 06/29/2014   Admitting Physician Cristal Ford, MD  Outpatient Primary MD for the patient is Nadean Corwin, MD  LOS - 2   Chief Complaint  Patient presents with  . Abdominal Pain      Brief narrative: Patient presents with complaints of abdominal pain, nausea, vomiting, recently discharged from [facility] after a long stay for similar complaints, repeat CT abdomen showing possible perforated appendix with abscess, a shunt was seen by surgical service, with recommendation of conservative management with IV antibiotics currently to treat subacute perforated appendicitis, with elective surgery in few weeks.  Subjective:   Blake Bradley today has, No headache, No chest pain, No abdominal pain - No Nausea, No new weakness tingling or numbness, No Cough - SOB. Started to tolerate clear liquids with no nausea or vomiting.  Assessment & Plan    Principal Problem:   Subacute appendicitis Active Problems:   Leukocytosis, unspecified   Abdominal pain   Nausea and vomiting  1-Subacute perforated appendicitis - Continue with IV Zosyn, IV fluid, pain medicine, antiemetic, advance diet to clear, continue with conservative management with the plan for interval appendectomy in several weeks as there is less risk of requiring an ostomy.  2-Leukocytosis -Result, do to #1, resolved.  3-Abdominal pain nausea vomiting Due  to #1, much improved, continue with as needed pain and nausea medicine.  Code Status: Full  Family Communication: Father is present.  Disposition Plan: home   Procedures  None   Consults   Surgery  Medications  Scheduled Meds: . dicyclomine  20 mg Oral TID AC  . enoxaparin (LOVENOX) injection  40 mg Subcutaneous Q24H  .  piperacillin-tazobactam (ZOSYN)  IV  3.375 g Intravenous 3 times per day   Continuous Infusions: . sodium chloride 1,000 mL (07/01/14 1547)   PRN Meds:.acetaminophen, acetaminophen, HYDROmorphone (DILAUDID) injection, ondansetron (ZOFRAN) IV, ondansetron, oxyCODONE-acetaminophen, promethazine, promethazine, promethazine  DVT Prophylaxis  Lovenox - SCDs   Lab Results  Component Value Date   PLT 363 07/01/2014    Antibiotics    Anti-infectives   Start     Dose/Rate Route Frequency Ordered Stop   06/29/14 2300  piperacillin-tazobactam (ZOSYN) IVPB 3.375 g     3.375 g 12.5 mL/hr over 240 Minutes Intravenous 3 times per day 06/29/14 2247     06/29/14 2245  metroNIDAZOLE (FLAGYL) IVPB 500 mg  Status:  Discontinued     500 mg 100 mL/hr over 60 Minutes Intravenous Every 8 hours 06/29/14 2241 06/29/14 2303          Objective:   Filed Vitals:   06/30/14 1331 06/30/14 2124 07/01/14 0604 07/01/14 1424  BP: 115/52 125/72 116/52 120/54  Pulse: 45 50 41 53  Temp: 97.7 F (36.5 C) 98.7 F (37.1 C) 97.8 F (36.6 C) 98.4 F (36.9 C)  TempSrc: Oral Oral Oral Oral  Resp: Height:      Weight:      SpO2: 100% 100% 100% 100%    Wt Readings from Last 3 Encounters:  06/30/14 68.357 kg (150 lb 11.2 oz)  06/20/14 73.71 kg (162 lb 8 oz)  06/20/14  73.71 kg (162 lb 8 oz)     Intake/Output Summary (Last 24 hours) at 07/01/14 1621 Last data filed at 06/30/14 1858  Gross per 24 hour  Intake 1652.08 ml  Output      0 ml  Net 1652.08 ml     Physical Exam  Awake Alert, Oriented X 3, No new F.N deficits, Normal affect Blake Bradley,PERRAL Supple Neck,No JVD, No cervical lymphadenopathy appriciated.  Symmetrical Chest wall movement, Good air movement bilaterally, CTAB RRR,No Gallops,Rubs or new Murmurs, No Parasternal Heave +ve B.Sounds, Abd Soft, No tenderness, No organomegaly appriciated, No rebound - guarding or rigidity. No Cyanosis, Clubbing or edema, No new Rash or bruise      Data Review   Micro Results No results found for this or any previous visit (from the past 240 hour(s)).  Radiology Reports Abd 1 View (kub)  06/30/2014   CLINICAL DATA:  Partial SBO, abdominal pain, followup  EXAM: ABDOMEN - 1 VIEW  COMPARISON:  CT abdomen pelvis of 06/29/2014  FINDINGS: Both large and small bowel gas is present without significant obstruction. This pattern may represent ileus. There is some contrast scattered throughout the nondistended colon. No opaque calculi are seen.  IMPRESSION: Both large and small bowel gas without definite obstruction. Possible mild ileus.   Electronically Signed   By: Dwyane Dee M.D.   On: 06/30/2014 08:07   Ct Abdomen Pelvis W Contrast  06/29/2014   CLINICAL DATA:  Abdomen pain. The patient wrist recently admitted for small bowel obstruction the was sent home yesterday. The abdomen pain returned.  EXAM: CT ABDOMEN AND PELVIS WITH CONTRAST  TECHNIQUE: Multidetector CT imaging of the abdomen and pelvis was performed using the standard protocol following bolus administration of intravenous contrast.  CONTRAST:  OMNIPAQUE IOHEXOL 300 MG/ML  SOLN  COMPARISON:  June 19, 2014 CT abdomen and pelvis. Abdominal x-ray June 29, 2014  FINDINGS: There is persistent but decreased inflammation predominantly centered around was appears to be the appendix with small adjacent 1.6 x 1.3 cm abscess. There is decreased inflammation surrounding the small bowel loops in the right lower quadrant. There are dilated small bowel loops throughout the abdomen but the number and caliber of dilated small bowel loops are decreased compared to the previous exam. There is air and residual contrast in the decompressed colon.  There is focal fatty infiltration of the liver around the falciform ligament. There is a 5 mm low density in the right lobe liver probably a small cyst. The liver is otherwise unremarkable the spleen, pancreas, gallbladder, adrenal glands and kidneys  are normal. There is no hydronephrosis bilaterally. The aorta is normal.  Fluid-filled bladder is normal. Pelvic phleboliths are noted. Images of the visualized lung bases are clear. No acute abnormality is identified within the visualized bones.  IMPRESSION: Findings probably due to perforated appendix with small adjacent 1.6 x 1.3 cm abscess. There is decreased inflammation surrounding the small bowel loops in the right lower quadrant. There are dilated small bowel loops throughout the abdomen but the number and caliber of dilated small bowel loops are decreased compared to the previous exam.   Electronically Signed   By: Sherian Rein M.D.   On: 06/29/2014 22:11   Dg Abd 2 Views  06/29/2014   CLINICAL DATA:  Generalized abdominal pain. nausea and vomiting. Diarrhea. One day status post colonoscopy.  EXAM: ABDOMEN - 2 VIEW  COMPARISON:  06/27/2014  FINDINGS: Multiple mildly dilated small bowel loops are again seen in the central  abdomen containing air-fluid levels. Colon is nondilated and contains some gas and contrast material. This could be due to a small bowel ileus or partial small bowel obstruction. There is no evidence of free air.  IMPRESSION: Small bowel ileus versus partial small bowel obstruction. No evidence of free air.   Electronically Signed   By: Myles Rosenthal M.D.   On: 06/29/2014 18:59    CBC  Recent Labs Lab 06/26/14 0500 06/27/14 0500 06/29/14 1444 06/30/14 0658 07/01/14 0640  WBC 10.8* 11.9* 18.1* 6.9 5.5  HGB 13.7 13.5 16.0 13.6 13.5  HCT 41.1 41.0 47.2 40.7 40.7  PLT 361 358 498* 378 363  MCV 86.5 87.0 88.2 87.5 86.6  MCH 28.8 28.7 29.9 29.2 28.7  MCHC 33.3 32.9 33.9 33.4 33.2  RDW 12.9 12.7 12.9 13.1 12.7  LYMPHSABS  --   --  1.7  --   --   MONOABS  --   --  0.9  --   --   EOSABS  --   --  0.4  --   --   BASOSABS  --   --  0.0  --   --     Chemistries   Recent Labs Lab 06/25/14 0844 06/26/14 0500 06/27/14 0500 06/29/14 1444 06/30/14 0658 07/01/14 0640  NA  140 140 139 139 141 139  K 3.6* 3.8 4.0 4.7 4.8 4.3  CL 101 98 100 99 101 99  CO2 27 32 GLUCOSE 99 107* 98 93 80 66*  BUN CREATININE 0.81 0.88 0.92 1.17 1.12 1.03  CALCIUM 8.6 8.8 8.7 9.5 8.9 8.8  MG 2.1  --   --   --   --   --   AST  --  18  --  20  --   --   ALT  --  16  --  26  --   --   ALKPHOS  --  95  --  109  --   --   BILITOT  --  0.5  --  0.9  --   --    ------------------------------------------------------------------------------------------------------------------ estimated creatinine clearance is 110.7 ml/min (by C-G formula based on Cr of 1.03). ------------------------------------------------------------------------------------------------------------------ No results found for this basename: HGBA1C,  in the last 72 hours ------------------------------------------------------------------------------------------------------------------ No results found for this basename: CHOL, HDL, LDLCALC, TRIG, CHOLHDL, LDLDIRECT,  in the last 72 hours ------------------------------------------------------------------------------------------------------------------ No results found for this basename: TSH, T4TOTAL, FREET3, T3FREE, THYROIDAB,  in the last 72 hours ------------------------------------------------------------------------------------------------------------------ No results found for this basename: VITAMINB12, FOLATE, FERRITIN, TIBC, IRON, RETICCTPCT,  in the last 72 hours  Coagulation profile No results found for this basename: INR, PROTIME,  in the last 168 hours  No results found for this basename: DDIMER,  in the last 72 hours  Cardiac Enzymes No results found for this basename: CK, CKMB, TROPONINI, MYOGLOBIN,  in the last 168 hours ------------------------------------------------------------------------------------------------------------------ No components found with this basename: POCBNP,      Time Spent in minutes   25  minutes   Eunice Oldaker M.D on 07/01/2014 at 4:21 PM  Between 7am to 7pm - Pager - [phone]  After 7pm go to [email or web address] - password [email or web address]  And look for the night coverage person covering for me after hours  [facility] Office  [phone]   **Disclaimer: This note may have been dictated with voice recognition software. Similar sounding words can inadvertently be transcribed and this note may contain  transcription errors which may not have been corrected upon publication of note.**

## 2014-07-01 NOTE — Plan of Care (Signed)
Problem: Phase I Progression Outcomes Goal: Initial discharge plan identified Outcome: Completed/Met Date Met:  07/01/14 To return home with parents

## 2014-07-02 NOTE — Progress Notes (Signed)
[facility] Progress Note    Subjective: Feeling much better.  Objective: Vital signs in last 24 hours: Temp:  [97.7 F (36.5 C)-98.9 F (37.2 C)] 97.7 F (36.5 C) (09/20 0530) Pulse Rate:  [42-53] 46 (09/20 0530) Resp:  [16-17] 16 (09/20 0530) BP: (119-122)/(54-64) 122/64 mmHg (09/20 0530) SpO2:  [100 %] 100 % (09/20 0530) Weight:  [67.042 kg (147 lb 12.8 oz)] 67.042 kg (147 lb 12.8 oz) (09/20 0530) Last BM Date: 07/01/14  Intake/Output from previous day: 09/19 0701 - 09/20 0700 In: 2976.7 [P.O.:270; I.V.:2606.7; IV Piggyback:100] Out: -  Intake/Output this shift:    General: No distress  Lungs: Clear  Abd: Soft, flat, good bowel sounds.  Extremities: No DVT signs or symptoms  Neuro: Intact  Lab Results:   BMET  Recent Labs  06/30/14 0658 07/01/14 0640  NA 141 139  K 4.8 4.3  CL 101 99  CO2 26 26  GLUCOSE 80 66*  BUN 10 9  CREATININE 1.12 1.03  CALCIUM 8.9 8.8   PT/INR No results found for this basename: LABPROT, INR,  in the last 72 hours ABG No results found for this basename: PHART, PCO2, PO2, HCO3,  in the last 72 hours  Studies/Results: No results found.  Anti-infectives: Anti-infectives   Start     Dose/Rate Route Frequency Ordered Stop   06/29/14 2300  piperacillin-tazobactam (ZOSYN) IVPB 3.375 g     3.375 g 12.5 mL/hr over 240 Minutes Intravenous 3 times per day 06/29/14 2247     06/29/14 2245  metroNIDAZOLE (FLAGYL) IVPB 500 mg  Status:  Discontinued     500 mg 100 mL/hr over 60 Minutes Intravenous Every 8 hours 06/29/14 2241 06/29/14 2303      Assessment/Plan: s/p  Advance diet Possibly home tomorrow on oral antibiotics.  LOS: 3 days   Marta Lamas. Gae Bon, MD, FACS [phone] [facility] Surgery 07/02/2014

## 2014-07-02 NOTE — Progress Notes (Signed)
Patient Demographics  Blake Bradley, is a 23 y.o. male, DOB - 21-Oct-1990, ZOX:[id number]  Admit date - 06/29/2014   Admitting Physician Cristal Ford, MD  Outpatient Primary MD for the patient is Nadean Corwin, MD  LOS - 3   Chief Complaint  Patient presents with  . Abdominal Pain      Brief narrative: Patient presents with complaints of abdominal pain, nausea, vomiting, recently discharged from [facility] after a long stay for similar complaints, repeat CT abdomen showing possible perforated appendix with abscess, a shunt was seen by surgical service, with recommendation of conservative management with IV antibiotics currently to treat subacute perforated appendicitis, with elective surgery in few weeks.  Subjective:   [name] today has, No headache, No chest pain, No abdominal pain - No Nausea, No new weakness tingling or numbness, No Cough - SOB. tolerate clear liquids with no nausea or vomiting.  Assessment & Plan    Principal Problem:   Subacute appendicitis Active Problems:   Leukocytosis, unspecified   Abdominal pain   Nausea and vomiting  1-Subacute perforated appendicitis - Continue with IV Zosyn, IV fluid, pain medicine, antiemetic, advance diet as per surgical recommendation, continue with conservative management with the plan for interval appendectomy in several weeks as there is less risk of requiring an ostomy. - Possible discharge in a.m. with oral antibiotic. 2-Leukocytosis -Result, do to #1, resolved.  3-Abdominal pain nausea vomiting Due  to #1, much improved, continue with as needed pain and nausea medicine.  Code Status: Full  Family Communication: Patient is awake and alert.  Disposition Plan: home in 24 hours if stable.   Procedures  None   Consults   Surgery  Medications  Scheduled Meds: .  dicyclomine  20 mg Oral TID AC  . enoxaparin (LOVENOX) injection  40 mg Subcutaneous Q24H  . piperacillin-tazobactam (ZOSYN)  IV  3.375 g Intravenous 3 times per day   Continuous Infusions: . sodium chloride 250 mL (07/02/14 1118)   PRN Meds:.acetaminophen, acetaminophen, HYDROmorphone (DILAUDID) injection, ondansetron (ZOFRAN) IV, ondansetron, oxyCODONE-acetaminophen, promethazine, promethazine, promethazine  DVT Prophylaxis  Lovenox - SCDs   Lab Results  Component Value Date   PLT 363 07/01/2014    Antibiotics    Anti-infectives   Start     Dose/Rate Route Frequency Ordered Stop   06/29/14 2300  piperacillin-tazobactam (ZOSYN) IVPB 3.375 g     3.375 g 12.5 mL/hr over 240 Minutes Intravenous 3 times per day 06/29/14 2247     06/29/14 2245  metroNIDAZOLE (FLAGYL) IVPB 500 mg  Status:  Discontinued     500 mg 100 mL/hr over 60 Minutes Intravenous Every 8 hours 06/29/14 2241 06/29/14 2303          Objective:   Filed Vitals:   07/01/14 0604 07/01/14 1424 07/01/14 2130 07/02/14 0530  BP: 116/52 120/54 119/56 122/64  Pulse: 41 53 42 46  Temp: 97.8 F (36.6 C) 98.4 F (36.9 C) 98.9 F (37.2 C) 97.7 F (36.5 C)  TempSrc: Oral Oral Oral Oral  Resp: Height:      Weight:    67.042 kg (147 lb 12.8 oz)  SpO2: 100% 100% 100% 100%    Wt Readings from Last 3  Encounters:  07/02/14 67.042 kg (147 lb 12.8 oz)  06/20/14 73.71 kg (162 lb 8 oz)  06/20/14 73.71 kg (162 lb 8 oz)     Intake/Output Summary (Last 24 hours) at 07/02/14 1308 Last data filed at 07/02/14 0600  Gross per 24 hour  Intake 2976.67 ml  Output      0 ml  Net 2976.67 ml     Physical Exam  Awake Alert, Oriented X 3, No new F.N deficits, Normal affect [place].AT,PERRAL Supple Neck,No JVD, No cervical lymphadenopathy appriciated.  Symmetrical Chest wall movement, Good air movement bilaterally, CTAB RRR,No Gallops,Rubs or new Murmurs, No Parasternal Heave +ve B.Sounds, Abd Soft, No tenderness,  No organomegaly appriciated, No rebound - guarding or rigidity. No Cyanosis, Clubbing or edema, No new Rash or bruise     Data Review   Micro Results No results found for this or any previous visit (from the past 240 hour(s)).  Radiology Reports No results found.  CBC  Recent Labs Lab 06/26/14 0500 06/27/14 0500 06/29/14 1444 06/30/14 0658 07/01/14 0640  WBC 10.8* 11.9* 18.1* 6.9 5.5  HGB 13.7 13.5 16.0 13.6 13.5  HCT 41.1 41.0 47.2 40.7 40.7  PLT 361 358 498* 378 363  MCV 86.5 87.0 88.2 87.5 86.6  MCH 28.8 28.7 29.9 29.2 28.7  MCHC 33.3 32.9 33.9 33.4 33.2  RDW 12.9 12.7 12.9 13.1 12.7  LYMPHSABS  --   --  1.7  --   --   MONOABS  --   --  0.9  --   --   EOSABS  --   --  0.4  --   --   BASOSABS  --   --  0.0  --   --     Chemistries   Recent Labs Lab 06/26/14 0500 06/27/14 0500 06/29/14 1444 06/30/14 0658 07/01/14 0640  NA 140 139 139 141 139  K 3.8 4.0 4.7 4.8 4.3  CL 98 100 99 101 99  CO2 32 GLUCOSE 107* 98 93 80 66*  BUN CREATININE 0.88 0.92 1.17 1.12 1.03  CALCIUM 8.8 8.7 9.5 8.9 8.8  AST 18  --  20  --   --   ALT 16  --  26  --   --   ALKPHOS 95  --  109  --   --   BILITOT 0.5  --  0.9  --   --    ------------------------------------------------------------------------------------------------------------------ estimated creatinine clearance is 108.4 ml/min (by C-G formula based on Cr of 1.03). ------------------------------------------------------------------------------------------------------------------ No results found for this basename: HGBA1C,  in the last 72 hours ------------------------------------------------------------------------------------------------------------------ No results found for this basename: CHOL, HDL, LDLCALC, TRIG, CHOLHDL, LDLDIRECT,  in the last 72 hours ------------------------------------------------------------------------------------------------------------------ No results found for  this basename: TSH, T4TOTAL, FREET3, T3FREE, THYROIDAB,  in the last 72 hours ------------------------------------------------------------------------------------------------------------------ No results found for this basename: VITAMINB12, FOLATE, FERRITIN, TIBC, IRON, RETICCTPCT,  in the last 72 hours  Coagulation profile No results found for this basename: INR, PROTIME,  in the last 168 hours  No results found for this basename: DDIMER,  in the last 72 hours  Cardiac Enzymes No results found for this basename: CK, CKMB, TROPONINI, MYOGLOBIN,  in the last 168 hours ------------------------------------------------------------------------------------------------------------------ No components found with this basename: POCBNP,      Time Spent in minutes   25 minutes   Vinessa Macconnell M.D on 07/02/2014 at 1:08 PM  Between 7am to 7pm - Pager -  [phone]  After 7pm go to [email or web address] - password [email or web address]  And look for the night coverage person covering for me after hours  [facility] Office  [phone]   **Disclaimer: This note may have been dictated with voice recognition software. Similar sounding words can inadvertently be transcribed and this note may contain transcription errors which may not have been corrected upon publication of note.**

## 2014-07-03 LAB — BASIC METABOLIC PANEL
Anion gap: 14 (ref 5–15)
BUN: 8 mg/dL (ref 6–23)
CO2: 28 mEq/L (ref 19–32)
CREATININE: 1.11 mg/dL (ref 0.50–1.35)
Calcium: 9.2 mg/dL (ref 8.4–10.5)
Chloride: 99 mEq/L (ref 96–112)
GFR calc Af Amer: >90 mL/min (ref >90)
GLUCOSE: 128 mg/dL — AB (ref 70–99)
POTASSIUM: 4.5 meq/L (ref 3.7–5.3)
Sodium: 141 mEq/L (ref 137–147)

## 2014-07-03 LAB — CBC
HEMATOCRIT: 43.2 % (ref 39.0–52.0)
HEMOGLOBIN: 14.6 g/dL (ref 13.0–17.0)
MCH: 29.4 pg (ref 26.0–34.0)
MCHC: 33.8 g/dL (ref 30.0–36.0)
MCV: 86.9 fL (ref 78.0–100.0)
Platelets: 406 10*3/uL — ABNORMAL HIGH (ref 150–400)
RBC: 4.97 MIL/uL (ref 4.22–5.81)
RDW: 12.9 % (ref 11.5–15.5)
WBC: 3.8 10*3/uL — ABNORMAL LOW (ref 4.0–10.5)

## 2014-07-03 MED ORDER — PIPERACILLIN-TAZOBACTAM 3.375 G IVPB 30 MIN
3.3750 g | Freq: Once | INTRAVENOUS | Status: AC
  Administered 2014-07-03: 3.375 g via INTRAVENOUS
  Filled 2014-07-03: qty 50

## 2014-07-03 MED ORDER — AMOXICILLIN-POT CLAVULANATE 875-125 MG PO TABS: 1.0000 | ORAL_TABLET | Freq: Two times a day (BID) | ORAL | Status: DC

## 2014-07-03 MED ORDER — OXYCODONE-ACETAMINOPHEN 5-325 MG PO TABS: 1.0000 | ORAL_TABLET | Freq: Four times a day (QID) | ORAL | Status: DC | PRN

## 2014-07-03 NOTE — Discharge Summary (Signed)
Edu Saulters, 23 y.o., DOB [birth year], MRN [id number]. Admission date: 06/29/2014 Discharge Date 07/03/2014 Primary MD Nadean Corwin, MD Admitting Physician Cristal Ford, MD  Admission Diagnosis  Generalized abdominal pain [789.07] Nausea vomiting and diarrhea [787.91, 787.01]  Discharge Diagnosis   Principal Problem:   Subacute appendicitis Active Problems:   Leukocytosis, unspecified   Abdominal pain   Nausea and vomiting      Past Medical History  Diagnosis Date  . Allergy   . ADD (attention deficit disorder)   . Family history of anesthesia complication     "grandmother had problems w/breathing" 06/29/2014)  . Complication of anesthesia 06/28/2014    "HR dropped, couldn't get BP up when put to sleep for colonscopy"  . WUJWJXBJ(478.2)     "maybe monthly" (06/29/2014)  . Bipolar disorder   . Inflammation of small intestine 06/2014 X 2    Past Surgical History  Procedure Laterality Date  . 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 Colonoscopy with propofol N/A 06/28/2014    Procedure: COLONOSCOPY WITH PROPOFOL;  Surgeon: Iva Boop, MD;  Location: [facility] ENDOSCOPY;  Service: Endoscopy;  Laterality: N/A;   Brief narrative:  Patient presents with complaints of abdominal pain, nausea, vomiting, recently discharged from [facility] after a long stay for similar complaints, repeat CT abdomen showing possible perforated appendix with abscess, a shunt was seen by surgical service, with recommendation of conservative management with IV antibiotics currently to treat subacute perforated appendicitis, with elective surgery in few weeks.   Hospital Course See H&P, Labs, Consult and Test reports for all details in brief,   1-Subacute perforated appendicitis  - Treated with with IV Zosyn, IV fluid, pain medicine, antiemetic, advanced diet as per surgical recommendation to soft diet, which he has been tolerating very well, plan is for conservative management with the  plan for interval appendectomy in several weeks as there is less risk of requiring an ostomy.  - A shunt will be discharged on soft diet, total of 10 days of oral Augmentin, with surgical followup in 2 weeks.  2-Leukocytosis  -Result, do to #1, resolved.   3-Abdominal pain nausea vomiting  Much improved, will discharge on when necessary Percocet   Principal Problem:   Subacute appendicitis Active Problems:   Leukocytosis, unspecified   Abdominal pain   Nausea and vomiting    Consults  Surgery Gastroenterology  Significant Tests:  See full reports for all details    Abd 1 View (kub)  06/30/2014   CLINICAL DATA:  Partial SBO, abdominal pain, followup  EXAM: ABDOMEN - 1 VIEW  COMPARISON:  CT abdomen pelvis of 06/29/2014  FINDINGS: Both large and small bowel gas is present without significant obstruction. This pattern may represent ileus. There is some contrast scattered throughout the nondistended colon. No opaque calculi are seen.  IMPRESSION: Both large and small bowel gas without definite obstruction. Possible mild ileus.   Electronically Signed   By: Dwyane Dee M.D.   On: 06/30/2014 08:07   Ct Abdomen Pelvis W Contrast  06/29/2014   CLINICAL DATA:  Abdomen pain. The patient wrist recently admitted for small bowel obstruction the was sent home yesterday. The abdomen pain returned.  EXAM: CT ABDOMEN AND PELVIS WITH CONTRAST  TECHNIQUE: Multidetector CT imaging of the abdomen and pelvis was performed using the standard protocol following bolus administration of intravenous contrast.  CONTRAST:  OMNIPAQUE IOHEXOL 300 MG/ML  SOLN  COMPARISON:  June 19, 2014 CT abdomen  and pelvis. Abdominal x-ray June 29, 2014  FINDINGS: There is persistent but decreased inflammation predominantly centered around was appears to be the appendix with small adjacent 1.6 x 1.3 cm abscess. There is decreased inflammation surrounding the small bowel loops in the right lower quadrant. There are  dilated small bowel loops throughout the abdomen but the number and caliber of dilated small bowel loops are decreased compared to the previous exam. There is air and residual contrast in the decompressed colon.  There is focal fatty infiltration of the liver around the falciform ligament. There is a 5 mm low density in the right lobe liver probably a small cyst. The liver is otherwise unremarkable the spleen, pancreas, gallbladder, adrenal glands and kidneys are normal. There is no hydronephrosis bilaterally. The aorta is normal.  Fluid-filled bladder is normal. Pelvic phleboliths are noted. Images of the visualized lung bases are clear. No acute abnormality is identified within the visualized bones.  IMPRESSION: Findings probably due to perforated appendix with small adjacent 1.6 x 1.3 cm abscess. There is decreased inflammation surrounding the small bowel loops in the right lower quadrant. There are dilated small bowel loops throughout the abdomen but the number and caliber of dilated small bowel loops are decreased compared to the previous exam.   Electronically Signed   By: Sherian Rein M.D.   On: 06/29/2014 22:11   Ct Abdomen Pelvis W Contrast  06/19/2014   CLINICAL DATA:  Low abdominal pain and fever for 4 days. Nausea and vomiting.  EXAM: CT ABDOMEN AND PELVIS WITH CONTRAST  TECHNIQUE: Multidetector CT imaging of the abdomen and pelvis was performed using the standard protocol following bolus administration of intravenous contrast.  CONTRAST:  OMNIPAQUE IOHEXOL 300 MG/ML  SOLN  COMPARISON:  None.  FINDINGS: The liver, spleen, gallbladder, pancreas, adrenal glands, kidneys, abdominal aorta, inferior vena cava, and retroperitoneal lymph nodes are unremarkable. Small accessory spleen. Air-fluid levels in the colon consistent with liquid stool. No colonic distention or wall thickening. Small bowel are mildly distended proximally with distal decompression. Wall thickening is suggested in distal small  bowel. Changes could represent infectious enteritis or inflammatory enteritis such as Crohn's disease. No definite abscess. There is mild edema/stranding in the mesenteric. Moderately prominent mesenteric lymph nodes are likely reactive. No free air or free fluid in the abdomen.  Pelvis: Bladder wall is not thickened. No free or loculated pelvic fluid collections. Mild thickening of decompressed sigmoid colonic wall. No destructive bone lesions.  IMPRESSION: Wall thickening in the distal small bowel with mesenteric edema and prominent mesenteric lymph nodes. Dilatation of proximal small bowel. Changes may represent infectious or inflammatory enteritis. Partial small bowel obstruction due to stricturing is not excluded. Sigmoid colon also appears thickened, also possibly infectious or inflammatory.   Electronically Signed   By: Burman Nieves M.D.   On: 06/19/2014 23:14   Dg Small Bowel  06/23/2014   CLINICAL DATA:  23 year old male with Crohn's disease, abdominal and pelvic pain, nausea vomiting and diarrhea.  EXAM: SMALL BOWEL SERIES  COMPARISON:  06/19/2014 CT  TECHNIQUE: Following ingestion of thin barium, serial small bowel images were obtained.  FLUOROSCOPY TIME:  0 min 0 seconds  FINDINGS: A scout film of the abdomen demonstrates distended small bowel loops. Gas in the colon and a small amount a gas in the rectum noted.  After administration of oral contrast, contrast flows freely into the proximal small bowel. Following this, very slow progression of oral contrast is noted with distended mid small  bowel loops noted. Contrast reaches the colon by 5 hr. Contrast within the distal small bowel is very dilute and therefore spot compression view was not performed.  IMPRESSION: Distended proximal small bowel loops with very slow progression of contrast into the colon. This likely represents a partial small bowel obstruction. Spot compression views were not obtained given very diluted contrast within the distal  small bowel.   Electronically Signed   By: Laveda Abbe M.D.   On: 06/23/2014 22:51   Dg Abd 2 Views  06/29/2014   CLINICAL DATA:  Generalized abdominal pain. nausea and vomiting. Diarrhea. One day status post colonoscopy.  EXAM: ABDOMEN - 2 VIEW  COMPARISON:  06/27/2014  FINDINGS: Multiple mildly dilated small bowel loops are again seen in the central abdomen containing air-fluid levels. Colon is nondilated and contains some gas and contrast material. This could be due to a small bowel ileus or partial small bowel obstruction. There is no evidence of free air.  IMPRESSION: Small bowel ileus versus partial small bowel obstruction. No evidence of free air.   Electronically Signed   By: Myles Rosenthal M.D.   On: 06/29/2014 18:59   Dg Abd 2 Views  06/27/2014   CLINICAL DATA:  Abdominal pain.  EXAM: ABDOMEN - 2 VIEW  COMPARISON:  June 26, 2014.  FINDINGS: Nasogastric tube tip is seen in proximal stomach. Residual contrast remains within the colon which is slightly decreased compared to prior exam. No abnormal bowel gas pattern is noted.  IMPRESSION: No evidence of bowel obstruction or ileus. Residual contrast remains within the colon.   Electronically Signed   By: Roque Lias M.D.   On: 06/27/2014 09:53   Dg Abd 2 Views  06/26/2014   CLINICAL DATA:  Abdominal pain, suspected small bowel obstruction ; assess nasogastric tube positioning  EXAM: ABDOMEN - 2 VIEW  COMPARISON:  Portable chest x-ray of June 25, 2014  FINDINGS: There are loops of mildly distended small bowel in the left mid abdomen. These have decreased in conspicuity since the earlier study. There remains distention of the ascending and transverse and proximal descending colons with contrast and gas from the CT scan of September 7th. The nasogastric tube proximal port lies at the level of the GE junction. The lung bases are clear.  IMPRESSION: 1. There persistently dilated loops of small bowel which have become less conspicuous which may indicate  resolving distal small bowel obstruction. 2. The nasogastric tubes proximal port lies at the level of the GE junction and advancement by at least 5 cm is recommended. 3. There remains mild gaseous distention of the ascending and transverse and proximal descending portions of the colon.   Electronically Signed   By: David  [place]   On: 06/26/2014 08:17   Dg Abd 2 Views  06/25/2014   CLINICAL DATA:  Abdominal pain.  EXAM: ABDOMEN - 2 VIEW  COMPARISON:  06/23/2014.  FINDINGS: Compared to the prior study, the oral contrast material is now noted in the colon, predominantly within the cecum. Several loops of gas-filled small bowel remain dilated measuring up to 5.3 cm in diameter in the left upper quadrant noted on today's study. No pneumoperitoneum.  IMPRESSION: 1. Findings remain compatible with partial small bowel obstruction, as above. 2. No pneumoperitoneum.   Electronically Signed   By: Trudie Reed M.D.   On: 06/25/2014 10:42   Dg Abd 2 Views  06/23/2014   CLINICAL DATA:  Abdominal pain  EXAM: ABDOMEN - 2 VIEW  COMPARISON:  06/19/2014  FINDINGS: Scattered large and small bowel gas is identified. Dilatation of small bowel is again identified but the colonic gas would indicate more of a partial small bowel obstruction or ileus. Correlation with physical exam is recommended. The overall appearance is stable from that seen on recent CT. No acute mass is noted. No abnormal calcifications or abnormal bony changes are noted.  IMPRESSION: Persistent small bowel dilatation stable from the prior exam. This likely represents a partial small bowel obstruction or ileus. Correlation with the physical exam is recommended.   Electronically Signed   By: Alcide Clever M.D.   On: 06/23/2014 07:45   Dg Abd Portable 1v  06/25/2014   CLINICAL DATA:  Nasogastric tube placement.  EXAM: PORTABLE ABDOMEN - 1 VIEW  COMPARISON:  Abdominal radiograph performed earlier today at 9:24 a.m.  FINDINGS: Distended air-filled loops of small  bowel are now seen throughout the abdomen and pelvis, more prominent than on prior studies. Air is also seen partially filling the colon, with contrast again noted along the ascending and transverse colon. The appearance raises suspicion for ileus, although partial small bowel obstruction remains a possibility.  The patient's enteric tube is seen ending overlying the body of the stomach, with the side port also ending overlying the body of the stomach. No definite free intra-abdominal air is identified, though evaluation for free air is limited on a single supine view.  No acute osseous abnormalities are seen.  IMPRESSION: 1. Enteric tube seen ending overlying the body of the stomach, with the side port also ending overlying the body of the stomach. 2. Mildly more prominent diffuse distention of small bowel loops within the abdomen and pelvis. The appearance raises suspicion for ileus, though partial small bowel obstruction remains a possibility. Contrast is again seen filling the ascending and transverse colon. No free intra-abdominal air seen.   Electronically Signed   By: Roanna Raider M.D.   On: 06/25/2014 23:28     Today   Subjective:   Blake Bradley today has no headache,no chest abdominal pain,no new weakness tingling or numbness, feels much better wants to go home today. Tolerated his diet with no problems.  Objective:   Blood pressure 117/51, pulse 45, temperature 97.6 F (36.4 C), temperature source Oral, resp. rate 15, height 6\' 2"  (1.88 m), weight 67.042 kg (147 lb 12.8 oz), SpO2 100.00%. No intake or output data in the 24 hours ending 07/03/14 1200  Exam Awake Alert, Oriented *3, No new F.N deficits, Normal affect [place].AT,PERRAL Supple Neck,No JVD, No cervical lymphadenopathy appriciated.  Symmetrical Chest wall movement, Good air movement bilaterally, CTAB RRR,No Gallops,Rubs or new Murmurs, No Parasternal Heave +ve B.Sounds, Abd Soft, Non tender, No organomegaly appriciated, No  rebound -guarding or rigidity. No Cyanosis, Clubbing or edema, No new Rash or bruise  Data Review     CBC w Diff:  Lab Results  Component Value Date   WBC 5.5 07/01/2014   HGB 13.5 07/01/2014   HCT 40.7 07/01/2014   PLT 363 07/01/2014   LYMPHOPCT 9* 06/29/2014   MONOPCT 5 06/29/2014   EOSPCT 2 06/29/2014   BASOPCT 0 06/29/2014   CMP:  Lab Results  Component Value Date   NA 139 07/01/2014   K 4.3 07/01/2014   CL 99 07/01/2014   CO2 26 07/01/2014   BUN 9 07/01/2014   CREATININE 1.03 07/01/2014   PROT 7.5 06/29/2014   ALBUMIN 3.7 06/29/2014   BILITOT 0.9 06/29/2014   ALKPHOS 109 06/29/2014  AST 20 06/29/2014   ALT 26 06/29/2014  .  Micro Results No results found for this or any previous visit (from the past 240 hour(s)).   Discharge Instructions     Followup with your PCP in 7 days. Followup with surgery in 14 days. Continue with soft diet until you have seen by surgery. Follow-up Information   Follow up with Wynona Luna., MD. Schedule an appointment as soon as possible for a visit in 2 weeks. (For post-hospital follow up)    Specialty:  General Surgery   Contact information:   [address] [place] [address] [phone]       Follow up with Nadean Corwin, MD In 1 week.   Specialty:  Internal Medicine   Contact information:   [address] [place] [address] [phone]       Discharge Medications     Medication List    STOP taking these medications       dicyclomine 20 MG tablet  Commonly known as:  BENTYL     ibuprofen 200 MG tablet  Commonly known as:  ADVIL,MOTRIN     ISOtretinoin 40 MG capsule  Commonly known as:  ACCUTANE      TAKE these medications       amoxicillin-clavulanate 875-125 MG per tablet  Commonly known as:  AUGMENTIN  Take 1 tablet by mouth 2 (two) times daily.     cetirizine 10 MG tablet  Commonly known as:  ZYRTEC  Take 10 mg by mouth daily.     oxyCODONE-acetaminophen 5-325 MG  per tablet  Commonly known as:  PERCOCET/ROXICET  Take 1 tablet by mouth every 6 (six) hours as needed for severe pain.         Total Time in preparing paper work, data evaluation and todays exam - 35 minutes  Akita Maxim M.D on 07/03/2014 at 12:00 PM  [facility] Office  [phone]

## 2014-07-03 NOTE — Progress Notes (Signed)
[facility] Progress Note     Subjective: Doing much better.  Had some RLQ discomfort with eating soft diet yesterday which lasted a few hours.  He's not having much pain now.  No N/V.  Had BM 2 days ago, none since.  Ambulating well.    Objective: Vital signs in last 24 hours: Temp:  [97.6 F (36.4 C)-98.3 F (36.8 C)] 97.6 F (36.4 C) (09/21 0631) Pulse Rate:  [45-66] 45 (09/21 0631) Resp:  [15-16] 15 (09/21 0631) BP: (117-128)/(51-68) 117/51 mmHg (09/21 0631) SpO2:  [100 %] 100 % (09/21 0631) Last BM Date: 07/01/14  Intake/Output from previous day:   Intake/Output this shift:    PE: Gen:  Alert, NAD, pleasant Abd: Soft, minimally tender in the RLQ, ND, +BS, no HSM, no abdominal scars noted   Lab Results:   Recent Labs  07/01/14 0640  WBC 5.5  HGB 13.5  HCT 40.7  PLT 363   BMET  Recent Labs  07/01/14 0640  NA 139  K 4.3  CL 99  CO2 26  GLUCOSE 66*  BUN 9  CREATININE 1.03  CALCIUM 8.8   PT/INR No results found for this basename: LABPROT, INR,  in the last 72 hours CMP     Component Value Date/Time   NA 139 07/01/2014 0640   K 4.3 07/01/2014 0640   CL 99 07/01/2014 0640   CO2 26 07/01/2014 0640   GLUCOSE 66* 07/01/2014 0640   BUN 9 07/01/2014 0640   CREATININE 1.03 07/01/2014 0640   CALCIUM 8.8 07/01/2014 0640   PROT 7.5 06/29/2014 1444   ALBUMIN 3.7 06/29/2014 1444   AST 20 06/29/2014 1444   ALT 26 06/29/2014 1444   ALKPHOS 109 06/29/2014 1444   BILITOT 0.9 06/29/2014 1444   GFRNONAA >90 07/01/2014 0640   GFRAA >90 07/01/2014 0640   Lipase     Component Value Date/Time   LIPASE 95* 06/29/2014 1444       Studies/Results: No results found.  Anti-infectives: Anti-infectives   Start     Dose/Rate Route Frequency Ordered Stop   06/29/14 2300  piperacillin-tazobactam (ZOSYN) IVPB 3.375 g     3.375 g 12.5 mL/hr over 240 Minutes Intravenous 3 times per day 06/29/14 2247     06/29/14 2245  metroNIDAZOLE (FLAGYL) IVPB 500 mg  Status:   Discontinued     500 mg 100 mL/hr over 60 Minutes Intravenous Every 8 hours 06/29/14 2241 06/29/14 2303       Assessment/Plan Subacute perforated appendicitis  Leukocytosis - resolved   Plan:  1. Leukocytosis resolved. Pt clinically improving. Conservative management has shown improvement with plan for interval appendectomy in several weeks down the road once the infection has resolved when there is less risk of requiring an ostomy.  2. IV antibiotics (Zosyn Day #5), IVF, pain control, antiemetics  3. Ambulate and IS  4. SCD's and lovenox  5. Tolerating soft diet 6.  Home today if tolerating breakfast and lunch better, then can follow up with [place] in the office for interval appendectomy 7.  Will need to finish a 10-14 day course of total antibiotics (Augmentin would be good)    LOS: 4 days    DORT, Virtie Bungert 07/03/2014, 7:53 AM Pager: [phone]

## 2014-07-03 NOTE — Progress Notes (Signed)
Improved.  OK for discharge with 9 more days of PO abx.  Wilmon Arms. Corliss Skains, MD, [facility]  General/ Trauma Surgery  07/03/2014 9:10 AM

## 2014-07-03 NOTE — Progress Notes (Signed)
NURSING PROGRESS NOTE  Blake Bradley [id number] Discharge Data: 07/03/2014 5:08 PM Attending Provider: Huey Bienenstock, MD WUJ:WJXBJYN,WGNFAOZ DAVID, MD     [name] to be D/C'd Home per MD order.  Discussed with the patient the After Visit Summary and all questions fully answered. All IV's discontinued with no bleeding noted. All belongings returned to patient for patient to take home.   Last Vital Signs:  Blood pressure 125/68, pulse 45, temperature 98.1 F (36.7 C), temperature source Oral, resp. rate 14, height  (1.88 m), weight 67.042 kg (147 lb 12.8 oz), SpO2 100.00%.  Discharge Medication List   Medication List    STOP taking these medications       dicyclomine 20 MG tablet  Commonly known as:  BENTYL     ibuprofen 200 MG tablet  Commonly known as:  ADVIL,MOTRIN     ISOtretinoin 40 MG capsule  Commonly known as:  ACCUTANE      TAKE these medications       amoxicillin-clavulanate 875-125 MG per tablet  Commonly known as:  AUGMENTIN  Take 1 tablet by mouth 2 (two) times daily.     cetirizine 10 MG tablet  Commonly known as:  ZYRTEC  Take 10 mg by mouth daily.     oxyCODONE-acetaminophen 5-325 MG per tablet  Commonly known as:  PERCOCET/ROXICET  Take 1 tablet by mouth every 6 (six) hours as needed for severe pain.         Cathlyn Parsons, RN

## 2014-07-03 NOTE — Progress Notes (Signed)
ANTIBIOTIC CONSULT NOTE - FOLLOW UP  Pharmacy Consult for zosyn Indication: empiric-- appendicitis  Allergies  Allergen Reactions  . Prednisone Hives    Patient Measurements: Height:  (188 cm) Weight: 147 lb 12.8 oz (67.042 kg) IBW/kg (Calculated) : 82.2  Vital Signs: Temp: 97.6 F (36.4 C) (09/21 0631) Temp src: Oral (09/21 0631) BP: 117/51 mmHg (09/21 0631) Pulse Rate: 45 (09/21 0631) Intake/Output from previous day:   Intake/Output from this shift:    Labs:  Recent Labs  07/01/14 0640  WBC 5.5  HGB 13.5  PLT 363  CREATININE 1.03   Estimated Creatinine Clearance: 108.4 ml/min (by C-G formula based on Cr of 1.03). No results found for this basename: VANCOTROUGH, Leodis Binet, VANCORANDOM, GENTTROUGH, GENTPEAK, GENTRANDOM, TOBRATROUGH, TOBRAPEAK, TOBRARND, AMIKACINPEAK, AMIKACINTROU, AMIKACIN,  in the last 72 hours   Microbiology: Recent Results (from the past 720 hour(s))  CULTURE, BLOOD (ROUTINE X 2)     Status: None   Collection Time    06/20/14  5:15 AM      Result Value Ref Range Status   Specimen Description BLOOD RIGHT ARM   Final   Special Requests BOTTLES DRAWN AEROBIC AND ANAEROBIC 8CC EA   Final   Culture  Setup Time     Final   Value: 06/20/2014 09:39     Performed at [facility]   Culture     Final   Value: NO GROWTH 5 DAYS     Performed at [facility]   Report Status 06/26/2014 FINAL   Final  CULTURE, BLOOD (ROUTINE X 2)     Status: None   Collection Time    06/20/14  5:27 AM      Result Value Ref Range Status   Specimen Description BLOOD LEFT HAND   Final   Special Requests BOTTLES DRAWN AEROBIC AND ANAEROBIC 5CC EA   Final   Culture  Setup Time     Final   Value: 06/20/2014 09:38     Performed at [facility]   Culture     Final   Value: NO GROWTH 5 DAYS     Performed at [facility]   Report Status 06/26/2014 FINAL   Final  CLOSTRIDIUM DIFFICILE BY PCR     Status: None   Collection Time   06/20/14 10:53 AM      Result Value Ref Range Status   C difficile by pcr NEGATIVE  NEGATIVE Final  STOOL CULTURE     Status: None   Collection Time    06/20/14 10:53 AM      Result Value Ref Range Status   Specimen Description STOOL   Final   Special Requests NONE   Final   Culture     Final   Value: NO SALMONELLA, SHIGELLA, CAMPYLOBACTER, YERSINIA, OR E.COLI 0157:H7 ISOLATED     Performed at [facility]   Report Status 06/24/2014 FINAL   Final    Anti-infectives   Start     Dose/Rate Route Frequency Ordered Stop   06/29/14 2300  piperacillin-tazobactam (ZOSYN) IVPB 3.375 g     3.375 g 12.5 mL/hr over 240 Minutes Intravenous 3 times per day 06/29/14 2247     06/29/14 2245  metroNIDAZOLE (FLAGYL) IVPB 500 mg  Status:  Discontinued     500 mg 100 mL/hr over 60 Minutes Intravenous Every 8 hours 06/29/14 2241 06/29/14 2303      Assessment: Patient is 23 y.o M with subacute perforated appendicitis with plan for  appendectomy once infection has resolved.  He's currently on zosyn day #5 with plan to d/c patient home today with PO abx for 9 more days.  No cultures, afeb, wbc wnl.  Plan:  - Continue Zosyn 3.375g IV q8h, transition to PO Abx at dischage - pharmacy will sign off   Donna Silverman P 07/03/2014,10:36 AM

## 2014-07-03 NOTE — Discharge Instructions (Signed)
Appendicitis Appendicitis means the appendix is puffy (inflamed). You need to get medical help right away. Without help, your problems can get much worse. The appendix can develop a hole (perforation). A pocket of yellowish-white fluid (abscess) can leak from the appendix. This can make you very sick. SYMPTOMS   Pain around the belly button (navel). The pain later moves toward the lower right belly (abdomen). The pain may be strong and sharp.  Tenderness in the lower right belly. The pain feels worse if you cough or move suddenly.  Feeling sick to your stomach (nausea).  Throwing up (vomiting).  No desire to eat (loss of appetite).  Fever.  Having a hard time pooping (constipation).  Watery poop (diarrhea).  Generally not feeling well. TREATMENT  In most cases, surgery is done to take out the appendix. This is done as soon as possible. This surgery is called appendectomy. Most people go home in 24 to 48 hours after surgery. If the appendix has a hole, surgery might be delayed. Any yellowish-white fluid will be removed with a drain. A drain removes fluid from the body. You may be given antibiotic medicine that kills germs. This medicine is given through a tube in your vein (IV). You may still need surgery after the fluid has been drained. You may need to stay in the hospital longer than 48 hours. Document Released: 12/22/2011 Document Reviewed: 12/22/2011 [facility] Patient Information 2015 [place], [place]. This information is not intended to replace advice given to you by your health care provider. Make sure you discuss any questions you have with your health care provider.  Appendicitis Appendicitis is when the appendix is swollen (inflamed). The inflammation can lead to developing a hole (perforation) and a collection of pus (abscess). CAUSES  There is not always an obvious cause of appendicitis. Sometimes it is caused by an obstruction in the appendix. The obstruction can be caused  by:  A small, hard, pea-sized ball of stool (fecalith).  Enlarged lymph glands in the appendix. SYMPTOMS   Pain around your belly button (navel) that moves toward your lower right belly (abdomen). The pain can become more severe and sharp as time passes.  Tenderness in the lower right abdomen. Pain gets worse if you cough or make a sudden movement.  Feeling sick to your stomach (nauseous).  Throwing up (vomiting).  Loss of appetite.  Fever.  Constipation.  Diarrhea.  Generally not feeling well. DIAGNOSIS   Physical exam.  Blood tests.  Urine test.  X-rays or a CT scan may confirm the diagnosis. TREATMENT  Once the diagnosis of appendicitis is made, the most common treatment is to remove the appendix as soon as possible. This procedure is called appendectomy. In an open appendectomy, a cut (incision) is made in the lower right abdomen and the appendix is removed. In a laparoscopic appendectomy, usually 3 small incisions are made. Long, thin instruments and a camera tube are used to remove the appendix. Most patients go home in 24 to 48 hours after appendectomy. In some situations, the appendix may have already perforated and an abscess may have formed. The abscess may have a "wall" around it as seen on a CT scan. In this case, a drain may be placed into the abscess to remove fluid, and you may be treated with antibiotic medicines that kill germs. The medicine is given through a tube in your vein (IV). Once the abscess has resolved, it may or may not be necessary to have an appendectomy. You may need  to stay in the hospital longer than 48 hours. Document Released: 09/29/2005 Document Revised: 03/30/2012 Document Reviewed: 12/25/2009 [facility] Patient Information 2015 [place], [place]. This information is not intended to replace advice given to you by your health care provider. Make sure you discuss any questions you have with your health care provider.  Follow with Primary MD  [name] DAVID, MD in 7 days   Get CBC, CMP,  checked  by Primary MD next visit.    Activity: As tolerated with Full fall precautions .   Disposition Home    Diet: Soft diet, he is to followup with surgery 2 weeks regarding decision advancing to regular diet. Please follow up with surgery in 2 weeks from discharge, schedule an appointment.   On your next visit with her primary care physician please Get Medicines reviewed and adjusted.  Please request your Prim.MD to go over all Hospital Tests and Procedure/Radiological results at the follow up, please get all Hospital records sent to your Prim MD by signing hospital release before you go home.   If you experience worsening of your admission symptoms, fever, chills, worsening abdominal pain develop shortness of breath, life threatening emergency, suicidal or homicidal thoughts you must seek medical attention immediately by calling 911 or calling your MD immediately    Do not drive when taking Pain medications.    Do not take more than prescribed Pain, Sleep and Anxiety Medications  Special Instructions: If you have smoked or chewed Tobacco  in the last 2 yrs please stop smoking, stop any regular Alcohol  and or any Recreational drug use.  Wear Seat belts while driving.   Please note  You were cared for by a hospitalist during your hospital stay. If you have any questions about your discharge medications or the care you received while you were in the hospital after you are discharged, you can call the unit and asked to speak with the hospitalist on call if the hospitalist that took care of you is not available. Once you are discharged, your primary care physician will handle any further medical issues. Please note that NO REFILLS for any discharge medications will be authorized once you are discharged, as it is imperative that you return to your primary care physician (or establish a relationship with a primary care physician  if you do not have one) for your aftercare needs so that they can reassess your need for medications and monitor your lab values.

## 2014-07-12 ENCOUNTER — Ambulatory Visit (INDEPENDENT_AMBULATORY_CARE_PROVIDER_SITE_OTHER): Payer: Self-pay | Admitting: Physician Assistant

## 2014-07-12 ENCOUNTER — Encounter: Payer: Self-pay | Admitting: Physician Assistant

## 2014-07-12 VITALS — BP 102/72 | HR 64 | Temp 97.9°F | Resp 16 | Ht 73.0 in | Wt 153.0 lb

## 2014-07-12 DIAGNOSIS — Z79899 Other long term (current) drug therapy: Secondary | ICD-10-CM

## 2014-07-12 DIAGNOSIS — K36 Other appendicitis: Secondary | ICD-10-CM

## 2014-07-12 DIAGNOSIS — R5381 Other malaise: Secondary | ICD-10-CM

## 2014-07-12 DIAGNOSIS — R531 Weakness: Secondary | ICD-10-CM

## 2014-07-12 DIAGNOSIS — R5383 Other fatigue: Secondary | ICD-10-CM

## 2014-07-12 LAB — CBC WITH DIFFERENTIAL/PLATELET
Basophils Absolute: 0.1 10*3/uL (ref 0.0–0.1)
Basophils Relative: 1 % (ref 0–1)
EOS ABS: 0.4 10*3/uL (ref 0.0–0.7)
Eosinophils Relative: 4 % (ref 0–5)
HCT: 43.1 % (ref 39.0–52.0)
Hemoglobin: 14.9 g/dL (ref 13.0–17.0)
LYMPHS ABS: 1.8 10*3/uL (ref 0.7–4.0)
Lymphocytes Relative: 20 % (ref 12–46)
MCH: 29.4 pg (ref 26.0–34.0)
MCHC: 34.6 g/dL (ref 30.0–36.0)
MCV: 85.2 fL (ref 78.0–100.0)
Monocytes Absolute: 0.7 10*3/uL (ref 0.1–1.0)
Monocytes Relative: 8 % (ref 3–12)
Neutro Abs: 6.2 10*3/uL (ref 1.7–7.7)
Neutrophils Relative %: 67 % (ref 43–77)
PLATELETS: 289 10*3/uL (ref 150–400)
RBC: 5.06 MIL/uL (ref 4.22–5.81)
RDW: 13.9 % (ref 11.5–15.5)
WBC: 9.2 10*3/uL (ref 4.0–10.5)

## 2014-07-12 LAB — BASIC METABOLIC PANEL WITH GFR
BUN: 8 mg/dL (ref 6–23)
CHLORIDE: 104 meq/L (ref 96–112)
CO2: 25 mEq/L (ref 19–32)
Calcium: 9.4 mg/dL (ref 8.4–10.5)
Creat: 0.95 mg/dL (ref 0.50–1.35)
GFR, Est African American: >89 mL/min
GFR, Est Non African American: >89 mL/min
Glucose, Bld: 72 mg/dL (ref 70–99)
Potassium: 4.2 mEq/L (ref 3.5–5.3)
Sodium: 142 mEq/L (ref 135–145)

## 2014-07-12 LAB — HEPATIC FUNCTION PANEL
ALK PHOS: 87 U/L (ref 39–117)
ALT: 15 U/L (ref 0–53)
AST: 18 U/L (ref 0–37)
Albumin: 4.3 g/dL (ref 3.5–5.2)
BILIRUBIN DIRECT: 0.1 mg/dL (ref 0.0–0.3)
BILIRUBIN TOTAL: 0.6 mg/dL (ref 0.2–1.2)
Indirect Bilirubin: 0.5 mg/dL (ref 0.2–1.2)
Total Protein: 6.7 g/dL (ref 6.0–8.3)

## 2014-07-12 LAB — MAGNESIUM: Magnesium: 1.9 mg/dL (ref 1.5–2.5)

## 2014-07-12 MED ORDER — OXYCODONE-ACETAMINOPHEN 10-325 MG PO TABS: 1.0000 | ORAL_TABLET | Freq: Four times a day (QID) | ORAL | Status: DC | PRN

## 2014-07-12 MED ORDER — DICYCLOMINE HCL 20 MG PO TABS: 20.0000 mg | ORAL_TABLET | Freq: Three times a day (TID) | ORAL | Status: DC | PRN

## 2014-07-12 MED ORDER — AMOXICILLIN-POT CLAVULANATE 875-125 MG PO TABS: 1.0000 | ORAL_TABLET | Freq: Two times a day (BID) | ORAL | Status: AC

## 2014-07-12 NOTE — Patient Instructions (Signed)

## 2014-07-12 NOTE — Progress Notes (Signed)
HPI 22 y.o.male presents for hosptial follow up. He had a complicated course of ruptured appendicitis with abscess. He was placed on IV antibiotics and treated with outpatient augmentin and oxycodone. He follows up with Dr. Corliss Skainssuei on Oct 9th. He states that he is having intermittent chest tightness, last a 30-60sec, nothing better or worse, worse with a shower and weakness with a shower. Denies fever, chills, diarrhea/constipation, still passing gas.   Past Medical History  Diagnosis Date  . Allergy   . ADD (attention deficit disorder)   . Family history of anesthesia complication     "grandmother had problems w/breathing" 06/29/2014)  . Complication of anesthesia 06/28/2014    "HR dropped, couldn't get BP up when put to sleep for colonscopy"  . ZOXWRUEA(540.9Headache(784.0)     "maybe monthly" (06/29/2014)  . Bipolar disorder   . Inflammation of small intestine 06/2014 X 2     Allergies  Allergen Reactions  . Prednisone Hives      Current Outpatient Prescriptions on File Prior to Visit  Medication Sig Dispense Refill  . amoxicillin-clavulanate (AUGMENTIN) 875-125 MG per tablet Take 1 tablet by mouth 2 (two) times daily.  20 tablet  0  . cetirizine (ZYRTEC) 10 MG tablet Take 10 mg by mouth daily.      Marland Kitchen. oxyCODONE-acetaminophen (PERCOCET/ROXICET) 5-325 MG per tablet Take 1 tablet by mouth every 6 (six) hours as needed for severe pain.  20 tablet  0   No current facility-administered medications on file prior to visit.    ROS: all negative expect above.   Physical: Filed Weights   07/12/14 1408  Weight: 153 lb (69.4 kg)   BP 102/72  Pulse 64  Temp(Src) 97.9 F (36.6 C)  Resp 16  Ht 6\' 1"  (1.854 m)  Wt 153 lb (69.4 kg)  BMI 20.19 kg/m2 General Appearance: Well nourished, in no apparent distress. Eyes: PERRLA, EOMs. Sinuses: No Frontal/maxillary tenderness ENT/Mouth: Ext aud canals clear, normal light reflex with TMs without erythema, bulging. Post pharynx without erythema, swelling,  exudate.  Respiratory: CTAB Cardio: RRR ? S3/S4 or strong S2 no murmurs, rubs or gallops. Peripheral pulses brisk and equal bilaterally, without edema. No aortic or femoral bruits. Abdomen: Soft, with bowl sounds. Tender right lower quadrant, no guarding, rebound. Lymphatics: Non tender without lymphadenopathy.  Musculoskeletal: Full ROM all peripheral extremities, 5/5 strength, and normal gait. Skin: Warm, dry without rashes, lesions, ecchymosis.  Neuro: Cranial nerves intact, reflexes equal bilaterally. Normal muscle tone, no cerebellar symptoms. Sensation intact.  Pysch: Awake and oriented X 3, normal affect, Insight and Judgment appropriate.   Assessment and Plan: Subacute appendicitis with abscess- follow with Dr. Corliss Skainssuei 2 weeks-  patient would like a refill of Augmentin and will give oxycodone 10/325 to take at night Weakness- check labs CBC/BMP, Mag Atypical Chest pain- ? S3/S4 or strong S2- no murmurs, regular rate- no accompaniments and lasts for 30-60 seconds- ? Anxiety/GERD- check labs, follow up surgery.

## 2014-07-17 ENCOUNTER — Telehealth: Payer: Self-pay | Admitting: *Deleted

## 2014-07-17 NOTE — Telephone Encounter (Signed)
Patient aware of lab results.  Patient asked if we know the name of a doctor who accepts Medicaid.  Per Dr Oneta RackMcKeown, he is not aware of who would accept it.

## 2014-07-28 ENCOUNTER — Other Ambulatory Visit: Payer: Self-pay

## 2014-07-28 ENCOUNTER — Ambulatory Visit (INDEPENDENT_AMBULATORY_CARE_PROVIDER_SITE_OTHER): Payer: Self-pay | Admitting: Surgery

## 2014-07-28 ENCOUNTER — Other Ambulatory Visit (INDEPENDENT_AMBULATORY_CARE_PROVIDER_SITE_OTHER): Payer: Self-pay | Admitting: Surgery

## 2014-07-28 DIAGNOSIS — K353 Acute appendicitis with localized peritonitis, without perforation or gangrene: Secondary | ICD-10-CM

## 2014-07-28 LAB — COMPREHENSIVE METABOLIC PANEL
ALT: 20 U/L (ref 0–53)
AST: 19 U/L (ref 0–37)
Albumin: 4.8 g/dL (ref 3.5–5.2)
Alkaline Phosphatase: 83 U/L (ref 39–117)
BILIRUBIN TOTAL: 0.9 mg/dL (ref 0.2–1.2)
BUN: 14 mg/dL (ref 6–23)
CALCIUM: 10 mg/dL (ref 8.4–10.5)
CHLORIDE: 101 meq/L (ref 96–112)
CO2: 26 meq/L (ref 19–32)
CREATININE: 1.03 mg/dL (ref 0.50–1.35)
GLUCOSE: 78 mg/dL (ref 70–99)
Potassium: 4.3 mEq/L (ref 3.5–5.3)
Sodium: 139 mEq/L (ref 135–145)
Total Protein: 7 g/dL (ref 6.0–8.3)

## 2014-07-28 LAB — CBC
HCT: 44.7 % (ref 39.0–52.0)
HEMOGLOBIN: 15.1 g/dL (ref 13.0–17.0)
MCH: 28.9 pg (ref 26.0–34.0)
MCHC: 33.8 g/dL (ref 30.0–36.0)
MCV: 85.5 fL (ref 78.0–100.0)
Platelets: 220 10*3/uL (ref 150–400)
RBC: 5.23 MIL/uL (ref 4.22–5.81)
RDW: 14.3 % (ref 11.5–15.5)
WBC: 9.1 10*3/uL (ref 4.0–10.5)

## 2014-08-04 ENCOUNTER — Encounter (HOSPITAL_COMMUNITY): Payer: Self-pay

## 2014-08-04 ENCOUNTER — Encounter (HOSPITAL_COMMUNITY)
Admission: RE | Admit: 2014-08-04 | Discharge: 2014-08-04 | Disposition: A | Discharge status: Home / Self Care | Payer: Medicaid Other | Source: Ambulatory Visit | Attending: Surgery | Admitting: Surgery

## 2014-08-04 DIAGNOSIS — Z01812 Encounter for preprocedural laboratory examination: Secondary | ICD-10-CM | POA: Insufficient documentation

## 2014-08-04 LAB — CBC
HCT: 41.2 % (ref 39.0–52.0)
Hemoglobin: 14.3 g/dL (ref 13.0–17.0)
MCH: 29.1 pg (ref 26.0–34.0)
MCHC: 34.7 g/dL (ref 30.0–36.0)
MCV: 83.9 fL (ref 78.0–100.0)
Platelets: 232 10*3/uL (ref 150–400)
RBC: 4.91 MIL/uL (ref 4.22–5.81)
RDW: 12.9 % (ref 11.5–15.5)
WBC: 5 10*3/uL (ref 4.0–10.5)

## 2014-08-04 NOTE — Pre-Procedure Instructions (Signed)
Blake Bradley  08/04/2014   Your procedure is scheduled on:  08/08/14  [facility] at 1030 AM.  Call this number if you have problems the morning of surgery: [phone]   Remember:   Do not eat food or drink liquids after midnight.   Take these medicines the morning of surgery with A SIP OF WATER: percocet,zantac   Do not wear jewelry, make-up or nail polish.  Do not wear lotions, powders, or perfumes. You may wear deodorant.  Do not shave 48 hours prior to surgery. Men may shave face and neck.  Do not bring valuables to the hospital.  [facility] not responsible                  for any belongings or valuables.               Contacts, dentures or bridgework may not be worn into surgery.  Leave suitcase in the car. After surgery it may be brought to your room.  For patients admitted to the hospital, discharge time is determined by your                treatment team.               Patients discharged the day of surgery will not be allowed to drive  home.  Name and phone number of your driver: family  Special Instructions: Shower using CHG 2 nights before surgery and the night before surgery.  If you shower the day of surgery use CHG.  Use special wash - you have one bottle of CHG for all showers.  You should use approximately 1/3 of the bottle for each shower.   Please read over the following fact sheets that you were given: Pain Booklet, Coughing and Deep Breathing and Surgical Site Infection Prevention

## 2014-08-07 ENCOUNTER — Encounter (HOSPITAL_COMMUNITY): Payer: Self-pay | Admitting: Internal Medicine

## 2014-08-07 MED ORDER — DEXTROSE 5 % IV SOLN
2.0000 g | INTRAVENOUS | Status: DC
  Filled 2014-08-07: qty 2

## 2014-08-08 ENCOUNTER — Encounter (HOSPITAL_COMMUNITY): Payer: Self-pay | Admitting: *Deleted

## 2014-08-08 ENCOUNTER — Observation Stay (HOSPITAL_COMMUNITY)
Admission: RE | Admit: 2014-08-08 | Discharge: 2014-08-10 | Disposition: A | Discharge status: Home / Self Care | Payer: Medicaid Other | Source: Ambulatory Visit | Attending: Surgery | Admitting: Surgery

## 2014-08-08 ENCOUNTER — Encounter (HOSPITAL_COMMUNITY): Payer: Medicaid Other | Admitting: Certified Registered"

## 2014-08-08 ENCOUNTER — Ambulatory Visit (HOSPITAL_COMMUNITY): Payer: Medicaid Other | Admitting: Certified Registered"

## 2014-08-08 ENCOUNTER — Encounter (HOSPITAL_COMMUNITY)
Admission: RE | Disposition: A | Discharge status: Home / Self Care | Payer: Self-pay | Source: Ambulatory Visit | Attending: Surgery

## 2014-08-08 DIAGNOSIS — F319 Bipolar disorder, unspecified: Secondary | ICD-10-CM | POA: Insufficient documentation

## 2014-08-08 DIAGNOSIS — K353 Acute appendicitis with localized peritonitis: Secondary | ICD-10-CM | POA: Diagnosis not present

## 2014-08-08 DIAGNOSIS — K3533 Acute appendicitis with perforation and localized peritonitis, with abscess: Secondary | ICD-10-CM

## 2014-08-08 HISTORY — PX: LAPAROSCOPIC APPENDECTOMY: SHX408

## 2014-08-08 HISTORY — DX: Acute appendicitis with perforation, localized peritonitis, and gangrene, with abscess: K35.33

## 2014-08-08 LAB — CBC
HEMATOCRIT: 37.1 % — AB (ref 39.0–52.0)
Hemoglobin: 13 g/dL (ref 13.0–17.0)
MCH: 29.6 pg (ref 26.0–34.0)
MCHC: 35 g/dL (ref 30.0–36.0)
MCV: 84.5 fL (ref 78.0–100.0)
Platelets: 208 10*3/uL (ref 150–400)
RBC: 4.39 MIL/uL (ref 4.22–5.81)
RDW: 13 % (ref 11.5–15.5)
WBC: 5.8 10*3/uL (ref 4.0–10.5)

## 2014-08-08 LAB — CREATININE, SERUM
Creatinine, Ser: 1.02 mg/dL (ref 0.50–1.35)
GFR calc non Af Amer: >90 mL/min (ref >90)

## 2014-08-08 SURGERY — APPENDECTOMY, LAPAROSCOPIC
Anesthesia: General | Site: Abdomen

## 2014-08-08 MED ORDER — GLYCOPYRROLATE 0.2 MG/ML IJ SOLN
INTRAMUSCULAR | Status: DC | PRN
  Administered 2014-08-08: 0.6 mg via INTRAVENOUS

## 2014-08-08 MED ORDER — CHLORHEXIDINE GLUCONATE 4 % EX LIQD
1.0000 | Freq: Once | CUTANEOUS | Status: DC
Start: 2014-08-09 — End: 2014-08-08
  Filled 2014-08-08: qty 15

## 2014-08-08 MED ORDER — PROPOFOL 10 MG/ML IV BOLUS
INTRAVENOUS | Status: AC
  Filled 2014-08-08: qty 20

## 2014-08-08 MED ORDER — KCL IN DEXTROSE-NACL 20-5-0.45 MEQ/L-%-% IV SOLN
INTRAVENOUS | Status: DC
  Administered 2014-08-08: 17:00:00 via INTRAVENOUS
  Filled 2014-08-08 (×2): qty 1000

## 2014-08-08 MED ORDER — BUPIVACAINE-EPINEPHRINE 0.25% -1:200000 IJ SOLN
INTRAMUSCULAR | Status: DC | PRN
  Administered 2014-08-08: 10 mL

## 2014-08-08 MED ORDER — ENOXAPARIN SODIUM 40 MG/0.4ML ~~LOC~~ SOLN
40.0000 mg | SUBCUTANEOUS | Status: DC
  Filled 2014-08-08: qty 0.4

## 2014-08-08 MED ORDER — OXYCODONE HCL 5 MG PO TABS
ORAL_TABLET | ORAL | Status: AC
  Filled 2014-08-08: qty 1

## 2014-08-08 MED ORDER — FENTANYL CITRATE 0.05 MG/ML IJ SOLN
INTRAMUSCULAR | Status: DC | PRN
  Administered 2014-08-08: 150 ug via INTRAVENOUS
  Administered 2014-08-08 (×4): 50 ug via INTRAVENOUS

## 2014-08-08 MED ORDER — OXYCODONE-ACETAMINOPHEN 5-325 MG PO TABS
ORAL_TABLET | ORAL | Status: AC
  Administered 2014-08-08: 2 via ORAL
  Filled 2014-08-08: qty 2

## 2014-08-08 MED ORDER — MORPHINE SULFATE 2 MG/ML IJ SOLN
2.0000 mg | INTRAMUSCULAR | Status: DC | PRN
  Administered 2014-08-08: 2 mg via INTRAVENOUS
  Administered 2014-08-09: 3 mg via INTRAVENOUS
  Administered 2014-08-09: 4 mg via INTRAVENOUS
  Filled 2014-08-08 (×3): qty 2
  Filled 2014-08-08: qty 1
  Filled 2014-08-08: qty 2

## 2014-08-08 MED ORDER — PROMETHAZINE HCL 25 MG/ML IJ SOLN
INTRAMUSCULAR | Status: AC
  Administered 2014-08-08: 6.25 mg via INTRAVENOUS
  Filled 2014-08-08: qty 1

## 2014-08-08 MED ORDER — PROMETHAZINE HCL 25 MG/ML IJ SOLN
6.2500 mg | INTRAMUSCULAR | Status: DC | PRN
  Administered 2014-08-08: 6.25 mg via INTRAVENOUS

## 2014-08-08 MED ORDER — MIDAZOLAM HCL 2 MG/2ML IJ SOLN
INTRAMUSCULAR | Status: AC
  Filled 2014-08-08: qty 2

## 2014-08-08 MED ORDER — DEXTROSE 5 % IV SOLN
1.0000 g | Freq: Four times a day (QID) | INTRAVENOUS | Status: DC
  Administered 2014-08-08 – 2014-08-09 (×2): 1 g via INTRAVENOUS
  Filled 2014-08-08 (×3): qty 1

## 2014-08-08 MED ORDER — FENTANYL CITRATE 0.05 MG/ML IJ SOLN
INTRAMUSCULAR | Status: AC
  Filled 2014-08-08: qty 5

## 2014-08-08 MED ORDER — HYDROMORPHONE HCL 1 MG/ML IJ SOLN
0.2500 mg | INTRAMUSCULAR | Status: DC | PRN
  Administered 2014-08-08: 0.5 mg via INTRAVENOUS
  Administered 2014-08-08: 1 mg via INTRAVENOUS
  Administered 2014-08-08: 0.5 mg via INTRAVENOUS

## 2014-08-08 MED ORDER — DICYCLOMINE HCL 20 MG PO TABS
20.0000 mg | ORAL_TABLET | Freq: Three times a day (TID) | ORAL | Status: DC | PRN
  Filled 2014-08-08: qty 1

## 2014-08-08 MED ORDER — LACTATED RINGERS IV SOLN
INTRAVENOUS | Status: DC
  Administered 2014-08-08 (×2): via INTRAVENOUS

## 2014-08-08 MED ORDER — ONDANSETRON HCL 4 MG/2ML IJ SOLN
4.0000 mg | Freq: Four times a day (QID) | INTRAMUSCULAR | Status: DC | PRN
  Administered 2014-08-09 (×2): 4 mg via INTRAVENOUS
  Filled 2014-08-08 (×2): qty 2

## 2014-08-08 MED ORDER — ROCURONIUM BROMIDE 100 MG/10ML IV SOLN
INTRAVENOUS | Status: DC | PRN
  Administered 2014-08-08: 50 mg via INTRAVENOUS

## 2014-08-08 MED ORDER — PROPOFOL 10 MG/ML IV BOLUS
INTRAVENOUS | Status: DC | PRN
  Administered 2014-08-08: 200 mg via INTRAVENOUS

## 2014-08-08 MED ORDER — MIDAZOLAM HCL 5 MG/5ML IJ SOLN
INTRAMUSCULAR | Status: DC | PRN
  Administered 2014-08-08: 2 mg via INTRAVENOUS

## 2014-08-08 MED ORDER — ACETAMINOPHEN 325 MG PO TABS: 650.0000 mg | ORAL_TABLET | ORAL | Status: DC | PRN

## 2014-08-08 MED ORDER — DEXTROSE 5 % IV SOLN
2.0000 g | INTRAVENOUS | Status: DC | PRN
  Administered 2014-08-08: 2 g via INTRAVENOUS

## 2014-08-08 MED ORDER — OXYCODONE HCL 5 MG PO TABS
5.0000 mg | ORAL_TABLET | Freq: Once | ORAL | Status: AC | PRN
  Administered 2014-08-08: 5 mg via ORAL

## 2014-08-08 MED ORDER — HYDROMORPHONE HCL 1 MG/ML IJ SOLN
INTRAMUSCULAR | Status: AC
  Administered 2014-08-08: 0.5 mg via INTRAVENOUS
  Filled 2014-08-08: qty 1

## 2014-08-08 MED ORDER — SODIUM CHLORIDE 0.9 % IR SOLN
Status: DC | PRN
  Administered 2014-08-08: 1000 mL

## 2014-08-08 MED ORDER — ONDANSETRON HCL 4 MG/2ML IJ SOLN
INTRAMUSCULAR | Status: DC | PRN
  Administered 2014-08-08: 4 mg via INTRAVENOUS

## 2014-08-08 MED ORDER — LIDOCAINE HCL (CARDIAC) 20 MG/ML IV SOLN
INTRAVENOUS | Status: DC | PRN
  Administered 2014-08-08: 80 mg via INTRAVENOUS

## 2014-08-08 MED ORDER — 0.9 % SODIUM CHLORIDE (POUR BTL) OPTIME
TOPICAL | Status: DC | PRN
  Administered 2014-08-08: 1000 mL

## 2014-08-08 MED ORDER — OXYCODONE HCL 5 MG/5ML PO SOLN: 5.0000 mg | Freq: Once | ORAL | Status: AC | PRN

## 2014-08-08 MED ORDER — OXYCODONE-ACETAMINOPHEN 5-325 MG PO TABS
1.0000 | ORAL_TABLET | ORAL | Status: DC | PRN
  Administered 2014-08-08 – 2014-08-09 (×3): 2 via ORAL
  Administered 2014-08-09: 1 via ORAL
  Filled 2014-08-08 (×4): qty 2

## 2014-08-08 MED ORDER — HYDROMORPHONE HCL 1 MG/ML IJ SOLN
INTRAMUSCULAR | Status: AC
  Filled 2014-08-08: qty 1

## 2014-08-08 MED ORDER — ONDANSETRON HCL 4 MG PO TABS: 4.0000 mg | ORAL_TABLET | Freq: Four times a day (QID) | ORAL | Status: DC | PRN

## 2014-08-08 MED ORDER — NEOSTIGMINE METHYLSULFATE 10 MG/10ML IV SOLN
INTRAVENOUS | Status: DC | PRN
  Administered 2014-08-08: 4 mg via INTRAVENOUS

## 2014-08-08 SURGICAL SUPPLY — 50 items
APL SKNCLS STERI-STRIP NONHPOA (GAUZE/BANDAGES/DRESSINGS) ×1
APPLIER CLIP ROT 10 11.4 M/L (STAPLE)
APR CLP MED LRG 11.4X10 (STAPLE)
BAG SPEC RTRVL LRG 6X4 10 (ENDOMECHANICALS) ×1
BENZOIN TINCTURE PRP APPL 2/3 (GAUZE/BANDAGES/DRESSINGS) ×3 IMPLANT
BLADE SURG ROTATE 9660 (MISCELLANEOUS) ×2 IMPLANT
CANISTER SUCTION 2500CC (MISCELLANEOUS) ×3 IMPLANT
CHLORAPREP W/TINT 26ML (MISCELLANEOUS) ×3 IMPLANT
CLIP APPLIE ROT 10 11.4 M/L (STAPLE) IMPLANT
CLOSURE WOUND 1/2 X4 (GAUZE/BANDAGES/DRESSINGS) ×1
COVER SURGICAL LIGHT HANDLE (MISCELLANEOUS) ×3 IMPLANT
CUTTER FLEX LINEAR 45M (STAPLE) ×3 IMPLANT
DRAPE LAPAROSCOPIC ABDOMINAL (DRAPES) ×3 IMPLANT
DRAPE UTILITY 15X26 W/TAPE STR (DRAPE) ×6 IMPLANT
DRSG TEGADERM 2-3/8X2-3/4 SM (GAUZE/BANDAGES/DRESSINGS) ×6 IMPLANT
DRSG TEGADERM 4X4.75 (GAUZE/BANDAGES/DRESSINGS) ×3 IMPLANT
ELECT REM PT RETURN 9FT ADLT (ELECTROSURGICAL) ×3
ELECTRODE REM PT RTRN 9FT ADLT (ELECTROSURGICAL) ×1 IMPLANT
ENDOLOOP SUT PDS II  0 18 (SUTURE)
ENDOLOOP SUT PDS II 0 18 (SUTURE) IMPLANT
FILTER SMOKE EVAC LAPAROSHD (FILTER) ×3 IMPLANT
GAUZE SPONGE 2X2 8PLY STRL LF (GAUZE/BANDAGES/DRESSINGS) ×1 IMPLANT
GLOVE BIO SURGEON STRL SZ7 (GLOVE) ×3 IMPLANT
GLOVE BIOGEL PI IND STRL 7.0 (GLOVE) IMPLANT
GLOVE BIOGEL PI IND STRL 7.5 (GLOVE) ×1 IMPLANT
GLOVE BIOGEL PI INDICATOR 7.0 (GLOVE) ×4
GLOVE BIOGEL PI INDICATOR 7.5 (GLOVE) ×2
GLOVE SURG SS PI 7.0 STRL IVOR (GLOVE) ×4 IMPLANT
GOWN STRL REUS W/ TWL LRG LVL3 (GOWN DISPOSABLE) ×3 IMPLANT
GOWN STRL REUS W/TWL LRG LVL3 (GOWN DISPOSABLE) ×9
KIT BASIN OR (CUSTOM PROCEDURE TRAY) ×3 IMPLANT
KIT ROOM TURNOVER OR (KITS) ×3 IMPLANT
NS IRRIG 1000ML POUR BTL (IV SOLUTION) ×3 IMPLANT
PACK ORTHO EXTREMITY (CUSTOM PROCEDURE TRAY) ×2 IMPLANT
PAD ARMBOARD 7.5X6 YLW CONV (MISCELLANEOUS) ×6 IMPLANT
POUCH SPECIMEN RETRIEVAL 10MM (ENDOMECHANICALS) ×3 IMPLANT
RELOAD STAPLE 45 3.5 BLU ETS (ENDOMECHANICALS) ×1 IMPLANT
RELOAD STAPLE TA45 3.5 REG BLU (ENDOMECHANICALS) ×3 IMPLANT
SCALPEL HARMONIC ACE (MISCELLANEOUS) ×3 IMPLANT
SET IRRIG TUBING LAPAROSCOPIC (IRRIGATION / IRRIGATOR) ×3 IMPLANT
SPECIMEN JAR SMALL (MISCELLANEOUS) ×3 IMPLANT
SPONGE GAUZE 2X2 STER 10/PKG (GAUZE/BANDAGES/DRESSINGS) ×2
STRIP CLOSURE SKIN 1/2X4 (GAUZE/BANDAGES/DRESSINGS) ×1 IMPLANT
SUT MNCRL AB 4-0 PS2 18 (SUTURE) ×3 IMPLANT
TOWEL OR 17X24 6PK STRL BLUE (TOWEL DISPOSABLE) ×1 IMPLANT
TOWEL OR 17X26 10 PK STRL BLUE (TOWEL DISPOSABLE) ×3 IMPLANT
TRAY LAPAROSCOPIC (CUSTOM PROCEDURE TRAY) ×3 IMPLANT
TROCAR XCEL BLADELESS 5X75MML (TROCAR) ×6 IMPLANT
TROCAR XCEL BLUNT TIP 100MML (ENDOMECHANICALS) ×3 IMPLANT
TUBING INSUFFLATION (TUBING) ×3 IMPLANT

## 2014-08-08 NOTE — Transfer of Care (Signed)
Immediate Anesthesia Transfer of Care Note  Patient: Blake Bradley  Procedure(s) Performed: Procedure(s): LAPAROSCOPIC APPENDECTOMY (N/A)  Patient Location: PACU  Anesthesia Type:General  Level of Consciousness: awake, alert  and oriented  Airway & Oxygen Therapy: Patient connected to face mask oxygen  Post-op Assessment: Report given to PACU RN  Post vital signs: stable  Complications: No apparent anesthesia complications

## 2014-08-08 NOTE — Anesthesia Procedure Notes (Signed)
Procedure Name: Intubation Date/Time: 08/08/2014 12:05 PM Performed by: Ellin GoodieWEAVER, Salene Mohamud M Pre-anesthesia Checklist: Patient identified, Emergency Drugs available, Suction available, Patient being monitored and Timeout performed Patient Re-evaluated:Patient Re-evaluated prior to inductionOxygen Delivery Method: Circle system utilized Preoxygenation: Pre-oxygenation with 100% oxygen Intubation Type: IV induction Ventilation: Mask ventilation without difficulty Laryngoscope Size: Mac and 3 Grade View: Grade I Tube size: 7.5 mm Number of attempts: 1 Airway Equipment and Method: Stylet Placement Confirmation: ETT inserted through vocal cords under direct vision,  positive ETCO2 and breath sounds checked- equal and bilateral Secured at: 22 cm Tube secured with: Tape Dental Injury: Teeth and Oropharynx as per pre-operative assessment

## 2014-08-08 NOTE — Anesthesia Postprocedure Evaluation (Signed)
  Anesthesia Post-op Note  Patient: Blake Bradley  Procedure(s) Performed: Procedure(s): LAPAROSCOPIC APPENDECTOMY (N/A)  Patient Location: PACU  Anesthesia Type:General  Level of Consciousness: sedated, patient cooperative and responds to stimulation  Airway and Oxygen Therapy: Patient Spontanous Breathing  Post-op Pain: mild, able to sleep   Post-op Assessment: Post-op Vital signs reviewed, Patient's Cardiovascular Status Stable, Respiratory Function Stable, Patent Airway, No signs of Nausea or vomiting and Pain level controlled  Post-op Vital Signs: Reviewed and stable  Last Vitals:  Filed Vitals:   08/08/14 1518  BP: 127/55  Pulse: 60  Temp:   Resp: 19    Complications: No apparent anesthesia complications

## 2014-08-08 NOTE — Progress Notes (Signed)
Patient/ family reports during the first hospitalization he had an allergic reaction to an antibiotic. They report that they did not know the name of the antibiotic. Spoke with Revonda StandardAllison, pharmacist who reports the first hospitalization patient had cipro and flagyl. Patient has had PO of same since discharge. Unable to determine exact allergen. Educated patient/ family that if she has a reaction again to get name of medication.

## 2014-08-08 NOTE — Op Note (Signed)
Appendectomy, Lap, Procedure Note  Indications: The patient presented with a history of lower abdominal pain. He was hospitalized for several days earlier in the summer.  A CT scan revealed findings consistent with perforated appendicitis with a small abscess.  He was treated with antibiotics and has improved significantly.  He presents for interval appendectomy.  Pre-operative Diagnosis: Acute appendicitis with peritoneal abscess  Post-operative Diagnosis: Same  Surgeon: Jeptha Hinnenkamp K.   Assistants: none  Anesthesia: General endotracheal anesthesia  ASA Class: 2  Procedure Details  The patient was seen again in the Holding Room. The risks, benefits, complications, treatment options, and expected outcomes were discussed with the patient and/or family. The possibilities of reaction to medication, perforation of viscus, bleeding, recurrent infection, finding a normal appendix, the need for additional procedures, failure to diagnose a condition, and creating a complication requiring transfusion or operation were discussed. There was concurrence with the proposed plan and informed consent was obtained. The site of surgery was properly noted. The patient was taken to Operating Room, identified as Blake Bradley and the procedure verified as Appendectomy. A Time Out was held and the above information confirmed.  The patient was placed in the supine position and general anesthesia was induced.  The abdomen was prepped and draped in a sterile fashion. A one centimeter supraumbilical incision was made.  Dissection was carried down to the fascia bluntly.  The fascia was incised vertically.  We entered the peritoneal cavity bluntly.  A pursestring suture was passed around the incision with a 0 Vicryl.  The Hasson cannula was introduced into the abdomen and the tails of the suture were used to hold the Hasson in place.   The pneumoperitoneum was then established maintaining a maximum pressure of 15 mmHg.   Additional 5 mm cannulas then placed in the left lower quadrant of the abdomen and the right upper quadrant under direct visualization. A careful evaluation of the entire abdomen was carried out. The patient was placed in Trendelenburg and left lateral decubitus position.  The scope was moved to the right upper quadrant port site. There is some fibrinous exudate in the pelvis, but there was no abscess.  The small bowel was examined and seemed to be free of any significant adhesions.  The cecum was mobilized medially.  The base of the appendix was identified.  The appendix was carefully dissected. The appendix was was skeletonized with the harmonic scalpel.   This appears to only be the proximal stump of the appendix, or a relatively short appendix. The appendix was divided at its base including the corner of the cecum using an endo-GIA stapler.  There was no evidence of bleeding, leakage, or complication after division of the appendix.  I then carefully inspected the bowel and the pelvis.  There is some ischemic appearing tissue adherent to the small bowel that may be the remnants of the rest of the appendix.  This was removed.   Irrigation was also performed and irrigate suctioned from the abdomen as well.  The umbilical port site was closed with the purse string suture. There was no residual palpable fascial defect.  The trocar site skin wounds were closed with 4-0 Monocryl.  Instrument, sponge, and needle counts were correct at the conclusion of the case.   Findings: The appendix was found to be inflamed. There were signs of necrosis.  There was perforation. There was abscess formation.  Estimated Blood Loss:  less than 50 mL         Drains:  none         Specimens: appendix         Complications:  None; patient tolerated the procedure well.         Disposition: PACU - hemodynamically stable.         Condition: stable  Blake Bradley K. Blake Skainssuei, MD, [facility]  General/ Trauma  Surgery  08/08/2014 1:15 PM

## 2014-08-08 NOTE — Anesthesia Preprocedure Evaluation (Addendum)
Anesthesia Evaluation  Patient identified by MRN, date of birth, ID band Patient awake    Reviewed: Allergy & Precautions, H&P , NPO status , Patient's Chart, lab work & pertinent test results  History of Anesthesia Complications (+) Family history of anesthesia reaction  Airway Mallampati: I       Dental  (+) Teeth Intact   Pulmonary  breath sounds clear to auscultation        Cardiovascular negative cardio ROS  Rhythm:Regular Rate:Normal     Neuro/Psych  Headaches, Bipolar Disorder    GI/Hepatic negative GI ROS, Neg liver ROS,   Endo/Other  negative endocrine ROS  Renal/GU negative Renal ROS     Musculoskeletal   Abdominal   Peds  Hematology negative hematology ROS (+)   Anesthesia Other Findings   Reproductive/Obstetrics                           Anesthesia Physical Anesthesia Plan  ASA: II  Anesthesia Plan: General   Post-op Pain Management:    Induction: Intravenous  Airway Management Planned: Oral ETT  Additional Equipment:   Intra-op Plan:   Post-operative Plan: Extubation in OR  Informed Consent: I have reviewed the patients History and Physical, chart, labs and discussed the procedure including the risks, benefits and alternatives for the proposed anesthesia with the patient or authorized representative who has indicated his/her understanding and acceptance.   Dental advisory given  Plan Discussed with: CRNA and Surgeon  Anesthesia Plan Comments:         Anesthesia Quick Evaluation

## 2014-08-08 NOTE — H&P (Signed)
History of Present Illness  Patient words: reck appy.  The patient is a 23 year old male who presents with abdominal pain. This is a previously healthy male who initially presented on 06/20/14 with abdominal pain, nausea, vomiting, and diarrhea x 3 days. His initial work-up showed thickening of the distal small bowel consistent with enteritis. The adjacent sigmoid colon also appeared to be thickened. He was initially felt to have possible inflammatory bowel disease and was managed with bowel rest and IV Cipro/Flagyl. He improved, although he had a PSBO vs Ileus for several days. He began having bowel movements and had a colonoscopy on 06/28/14 that was unremarkable. He was discharged later that day, but returned with diarrhea and abdominal pain. Repeat CT scan showed that the thickening of the small bowel had decreased significantly, and in retrospect, this probably represented perforated appendicitis with a small abscess. He was treated with Zosyn and eventually transitioned to Augmentin. He was discharged on 07/03/14 on Augmentin. Over the last couple of days, he has had intermittent lower abdominal pain, but no fever, nausea, vomiting, or diarrhea.    Other Problems  No pertinent past medical history  Past Surgical HistoryKnee Surgery Left.  Diagnostic Studies History Colonoscopy within last year  Allergies  PredniSONE (Pak) *CORTICOSTEROIDS*  Medication HistoryOxycodone-Acetaminophen (10-325MG  Tablet, Oral as needed) Active.  Social History Caffeine use Coffee, Tea. No alcohol use No drug use Tobacco use Never smoker.  Family History  Breast Cancer Mother. Colon Polyps Mother. Heart disease in male family member before age 21 Heart disease in male family member before age 21 Ischemic Bowel Disease Mother.  Review of Systems General Present- Appetite Loss, Fatigue and Night Sweats. Not Present- Chills, Fever, Weight Gain and Weight Loss. Skin Not Present-  Change in Wart/Mole, Dryness, Hives, Jaundice, New Lesions, Non-Healing Wounds, Rash and Ulcer. HEENT Not Present- Earache, Hearing Loss, Hoarseness, Nose Bleed, Oral Ulcers, Ringing in the Ears, Seasonal Allergies, Sinus Pain, Sore Throat, Visual Disturbances, Wears glasses/contact lenses and Yellow Eyes. Respiratory Not Present- Bloody sputum, Chronic Cough, Difficulty Breathing, Snoring and Wheezing. Breast Not Present- Breast Mass, Breast Pain, Nipple Discharge and Skin Changes. Cardiovascular Present- Chest Pain and Shortness of Breath. Not Present- Difficulty Breathing Lying Down, Leg Cramps, Palpitations, Rapid Heart Rate and Swelling of Extremities. Gastrointestinal Present- Abdominal Pain, Bloating, Constipation, Excessive gas, Hemorrhoids and Nausea. Not Present- Bloody Stool, Change in Bowel Habits, Chronic diarrhea, Difficulty Swallowing, Gets full quickly at meals, Indigestion, Rectal Pain and Vomiting. Male Genitourinary Present- Frequency and Nocturia. Not Present- Blood in Urine, Change in Urinary Stream, Impotence, Painful Urination, Urgency and Urine Leakage. Musculoskeletal Present- Muscle Weakness. Not Present- Back Pain, Joint Pain, Joint Stiffness, Muscle Pain and Swelling of Extremities. Neurological Present- Headaches and Weakness. Not Present- Decreased Memory, Fainting, Numbness, Seizures, Tingling, Tremor and Trouble walking. Psychiatric Present- Change in Sleep Pattern. Not Present- Anxiety, Bipolar, Depression, Fearful and Frequent crying. Endocrine Present- Hot flashes. Not Present- Cold Intolerance, Excessive Hunger, Hair Changes, Heat Intolerance and New Diabetes. Hematology Not Present- Easy Bruising, Excessive bleeding, Gland problems, HIV and Persistent Infections.   Vitals  Weight: 149 lb Height: 74in Body Surface Area: 1.88 m Body Mass Index: 19.13 kg/m Temp.: 73F(Temporal)  Pulse: 76 (Regular)  BP: 122/80 (Sitting, Left Arm,  Standard)    Physical Exam The physical exam findings are as follows: Note:WDWN in NAD HEENT: EOMI, sclera anicteric Neck: No masses, no thyromegaly Lungs: CTA bilaterally; normal respiratory effort CV: Regular rate and rhythm; no murmurs Abd: +bowel sounds, soft,  non-distended; tender in both lower quadrants; no palpable masses; no rebound Ext: Well-perfused; no edema Skin: Warm, dry; no sign of jaundice    Assessment & PlanAPPENDICITIS WITH PERFORATION (540.0  K35.2) Current Plans  CBC, PLATELETS & AUT DIFF (16109(85025) COMPREHENSIVE METABOLIC PANEL ([id number]) Schedule for Surgery Started Cipro 500MG , 1 (one) Tablet two times daily, #28, 07/28/2014, Ref. x2. Started Flagyl 500MG , 1 (one) Tablet three times daily, #42, 14 days starting 07/28/2014, Ref. x3. Note:The patient likely has some residual inflammation, despite taking his Augmentin. He has only been taking half of the recommended dose, so it is unclear whether he has received therapeutic levels.     Laparoscopic appendectomy, possible open appendectomy. The surgical procedure has been discussed with the patient. Potential risks, benefits, alternative treatments, and expected outcomes have been explained. All of the patient's questions at this time have been answered. The likelihood of reaching the patient's treatment goal is good. The patient understand the proposed surgical procedure and wishes to proceed.   Wilmon ArmsMatthew K. Corliss Skainssuei, MD, [facility]  General/ Trauma Surgery  08/08/2014 10:21 AM

## 2014-08-09 DIAGNOSIS — K353 Acute appendicitis with localized peritonitis: Secondary | ICD-10-CM | POA: Diagnosis not present

## 2014-08-09 MED ORDER — POLYETHYLENE GLYCOL 3350 17 G PO PACK
17.0000 g | PACK | Freq: Once | ORAL | Status: AC
  Administered 2014-08-09: 17 g via ORAL
  Filled 2014-08-09: qty 1

## 2014-08-09 MED ORDER — KETOROLAC TROMETHAMINE 30 MG/ML IJ SOLN
30.0000 mg | Freq: Three times a day (TID) | INTRAMUSCULAR | Status: DC | PRN
  Administered 2014-08-09 (×2): 30 mg via INTRAVENOUS
  Filled 2014-08-09 (×2): qty 1

## 2014-08-09 MED ORDER — OXYCODONE-ACETAMINOPHEN 5-325 MG PO TABS: 1.0000 | ORAL_TABLET | ORAL | Status: DC | PRN

## 2014-08-09 MED ORDER — HYDROCORTISONE 0.5 % EX CREA
TOPICAL_CREAM | Freq: Two times a day (BID) | CUTANEOUS | Status: DC
  Administered 2014-08-09: 21:00:00 via TOPICAL
  Filled 2014-08-09: qty 28.35

## 2014-08-09 MED ORDER — DIPHENHYDRAMINE HCL 25 MG PO CAPS
25.0000 mg | ORAL_CAPSULE | ORAL | Status: DC | PRN
  Administered 2014-08-09: 25 mg via ORAL
  Filled 2014-08-09 (×2): qty 1

## 2014-08-09 MED ORDER — KETOROLAC TROMETHAMINE 30 MG/ML IJ SOLN: 30.0000 mg | Freq: Once | INTRAMUSCULAR | Status: DC

## 2014-08-09 MED ORDER — POLYETHYLENE GLYCOL 3350 17 G PO PACK: 17.0000 g | PACK | Freq: Once | ORAL | Status: DC

## 2014-08-09 NOTE — Progress Notes (Signed)
UR completed 

## 2014-08-09 NOTE — Discharge Instructions (Signed)
[facility] °LAPAROSCOPIC SURGERY: POST OP INSTRUCTIONS °Always review your discharge instruction sheet given to you by the facility where your surgery was performed. °IF YOU HAVE DISABILITY OR FAMILY LEAVE FORMS, YOU MUST BRING THEM TO THE OFFICE FOR PROCESSING.   °DO NOT GIVE THEM TO YOUR DOCTOR. ° °1. A prescription for pain medication will be given to you upon discharge.  Take your pain medication as prescribed, if needed.  If narcotic pain medicine is not needed, then you may take acetaminophen (Tylenol) or ibuprofen (Advil) as needed. °2. Take your usually prescribed medications unless otherwise directed. °3. If you need a refill on your pain medication, please contact your pharmacy.  They will contact our office to request authorization. Prescriptions will not be filled after 5pm or on week-ends. °4. You should follow a light diet the first few days after arrival home, such as soup and crackers, etc.  Be sure to include lots of fluids daily. °5. Most patients will experience some swelling and bruising in the area of the incisions.  Ice packs will help.  Swelling and bruising can take several days to resolve.  °6. It is common to experience some constipation if taking pain medication after surgery.  Increasing fluid intake and taking a stool softener (such as Colace) will usually help or prevent this problem from occurring.  A mild laxative (Milk of Magnesia or Miralax) should be taken according to package instructions if there are no bowel movements after 48 hours. °7. Unless discharge instructions indicate otherwise, you may remove your bandages 48 hours after surgery, and you may shower at that time.  You will have steri-strips (small skin tapes) in place directly over the incision.  These strips should be left on the skin for 7-10 days.  If your surgeon used skin glue on the incision, you may shower in 24 hours.  The glue will flake off over the next 2-3 weeks.  Any sutures or staples  will be removed at the office during your follow-up visit. °8. ACTIVITIES:  You may resume regular (light) daily activities beginning the next day--such as daily Klett-care, walking, climbing stairs--gradually increasing activities as tolerated.  You may have sexual intercourse when it is comfortable.  Refrain from any heavy lifting or straining until approved by your doctor. °a. You may drive when you are no longer taking prescription pain medication, you can comfortably wear a seatbelt, and you can safely maneuver your car and apply brakes. °b. RETURN TO WORK:   2-3 weeks °9. You should see your doctor in the office for a follow-up appointment approximately 2-3 weeks after your surgery.  Make sure that you call for this appointment within a day or two after you arrive home to insure a convenient appointment time. °10. OTHER INSTRUCTIONS: ________________________________________________________________________ °WHEN TO CALL YOUR DOCTOR: °1. Fever over 101.0 °2. Inability to urinate °3. Continued bleeding from incision. °4. Increased pain, redness, or drainage from the incision. °5. Increasing abdominal pain ° °The clinic staff is available to answer your questions during regular business hours.  Please don’t hesitate to call and ask to speak to one of the nurses for clinical concerns.  If you have a medical emergency, go to the nearest emergency room or call 911.  A surgeon from [facility] is always on call at the hospital. °[address], [address], [place], [place]  [address] ? [address] [address], [place], [place]   [address] °[phone] ? [phone] ? FAX [phone] °Web site:   [email or web address] ° °

## 2014-08-09 NOTE — Discharge Summary (Signed)
Physician Discharge Summary  Patient ID: Blake Bradley MRN: [id number] DOB/AGE: 21/04/1991 21 y.o.  Admit date: 08/08/2014 Discharge date: 08/09/2014  Admission Diagnoses:  S/p perforated appendicitis with abscess  Discharge Diagnoses: Same Active Problems:   Appendicitis with abscess   Discharged Condition: good  Hospital Course: Laparoscopic interval appendectomy 08/08/14.  Patient tolerated procedure well.  He stayed overnight for pain control issues as well as anxiety.  Some mild difficulty with urination, but able to void 200 ml at a time.  Diet advanced and patient was discharged on the afternoon of POD#1.  Consults: None  Significant Diagnostic Studies: none  Treatments: surgery: lap appendectomy 10/27  Discharge Exam: Blood pressure 115/49, pulse 47, temperature 97.8 F (36.6 C), temperature source Oral, resp. rate 18, height 6\' 1"  (1.854 m), weight 154 lb (69.854 kg), SpO2 100.00%. General appearance: alert, cooperative and no distress GI: soft, incisional tenderness Incisions c/d/i Mild rash on right upper arm  Disposition: 01-Home or [facility]  Discharge Instructions   Call MD for:  persistant nausea and vomiting    Complete by:  As directed      Call MD for:  redness, tenderness, or signs of infection (pain, swelling, redness, odor or green/yellow discharge around incision site)    Complete by:  As directed      Call MD for:  severe uncontrolled pain    Complete by:  As directed      Call MD for:  temperature >100.4    Complete by:  As directed      Diet general    Complete by:  As directed      Driving Restrictions    Complete by:  As directed   Do not drive while taking pain medications     Increase activity slowly    Complete by:  As directed      May shower / Bathe    Complete by:  As directed      May walk up steps    Complete by:  As directed             Medication List    STOP taking these medications       ciprofloxacin 500 MG tablet   Commonly known as:  CIPRO     metroNIDAZOLE 500 MG tablet  Commonly known as:  FLAGYL     oxyCODONE-acetaminophen 10-325 MG per tablet  Commonly known as:  PERCOCET  Replaced by:  oxyCODONE-acetaminophen 5-325 MG per tablet      TAKE these medications       dicyclomine 20 MG tablet  Commonly known as:  BENTYL  Take 1 tablet (20 mg total) by mouth 3 (three) times daily as needed for spasms.     MAGNESIUM PO  Take 1 tablet by mouth daily.     oxyCODONE-acetaminophen 5-325 MG per tablet  Commonly known as:  PERCOCET/ROXICET  Take 1-2 tablets by mouth every 4 (four) hours as needed for moderate pain.     polyethylene glycol packet  Commonly known as:  MIRALAX / GLYCOLAX  Take 17 g by mouth once.     ranitidine 150 MG tablet  Commonly known as:  ZANTAC  Take 150 mg by mouth 2 (two) times daily.           Follow-up Information   Follow up with Evelynn Hench K., MD. Schedule an appointment as soon as possible for a visit in 3 weeks.   Specialty:  General Surgery   Contact information:   [phone]  [address] [place] [address] [phone]       Signed: Wynona Bradley,Blake Rimmer K. 08/09/2014, 6:39 AM

## 2014-08-10 ENCOUNTER — Encounter (HOSPITAL_COMMUNITY): Payer: Self-pay | Admitting: Surgery

## 2014-08-10 DIAGNOSIS — K353 Acute appendicitis with localized peritonitis: Secondary | ICD-10-CM | POA: Diagnosis not present

## 2014-08-10 NOTE — Progress Notes (Signed)
2 Days Post-Op  Subjective: Patient declined to go home yesterday because of anxiety and pain issues.  Ready to go home today. Has a localized rash on his right bicep where his BP cuff was located.  No generalized irritation - not a drug reaction Pain better controlled  Objective: Vital signs in last 24 hours: Temp:  [97.5 F (36.4 C)-98 F (36.7 C)] 97.5 F (36.4 C) (10/29 0515) Pulse Rate:  [45-74] 45 (10/29 0515) Resp:  [15] 15 (10/29 0515) BP: (113-121)/(49-60) 115/49 mmHg (10/29 0515) SpO2:  [100 %] 100 % (10/29 0515) Last BM Date: 08/07/14  Intake/Output from previous day: 10/28 0701 - 10/29 0700 In: 490 [P.O.:490] Out: 500 [Urine:500] Intake/Output this shift: Total I/O In: 240 [P.O.:240] Out: -   General appearance: alert, cooperative and no distress GI: soft, minimally tender Dressing dry - will remove and he may shower  Lab Results:   Recent Labs  08/08/14 1628  WBC 5.8  HGB 13.0  HCT 37.1*  PLT 208   BMET  Recent Labs  08/08/14 1628  CREATININE 1.02   PT/INR No results found for this basename: LABPROT, INR,  in the last 72 hours ABG No results found for this basename: PHART, PCO2, PO2, HCO3,  in the last 72 hours  Studies/Results: No results found.  Anti-infectives: Anti-infectives   Start     Dose/Rate Route Frequency Ordered Stop   08/08/14 1800  cefOXitin (MEFOXIN) 1 g in dextrose 5 % 50 mL IVPB  Status:  Discontinued     1 g 100 mL/hr over 30 Minutes Intravenous Every 6 hours 08/08/14 1321 08/09/14 0628   08/08/14 0600  cefOXitin (MEFOXIN) 2 g in dextrose 5 % 50 mL IVPB  Status:  Discontinued     2 g 100 mL/hr over 30 Minutes Intravenous On call to O.R. 08/07/14 1443 08/08/14 1505      Assessment/Plan: s/p Procedure(s): LAPAROSCOPIC APPENDECTOMY (N/A) Discharge  LOS: 2 days    Marcey Persad K. 08/10/2014

## 2014-08-10 NOTE — Progress Notes (Signed)
AVS discharge instructions were reviewed with patient . Patient was given a prescription for percocet to take to his pharmacy and reminded to pick up miralax from pharmacy. Patient stated that he did not have any question. Patient ambulated with a family member to his transportation.

## 2014-09-21 ENCOUNTER — Emergency Department (HOSPITAL_COMMUNITY): Payer: Medicaid Other

## 2014-09-21 ENCOUNTER — Emergency Department (HOSPITAL_COMMUNITY)
Admission: EM | Admit: 2014-09-21 | Discharge: 2014-09-21 | Disposition: A | Discharge status: Home / Self Care | Payer: Self-pay | Attending: Emergency Medicine | Admitting: Emergency Medicine

## 2014-09-21 ENCOUNTER — Encounter (HOSPITAL_COMMUNITY): Payer: Self-pay | Admitting: Emergency Medicine

## 2014-09-21 DIAGNOSIS — R109 Unspecified abdominal pain: Secondary | ICD-10-CM

## 2014-09-21 DIAGNOSIS — Z79899 Other long term (current) drug therapy: Secondary | ICD-10-CM | POA: Insufficient documentation

## 2014-09-21 DIAGNOSIS — R1033 Periumbilical pain: Secondary | ICD-10-CM

## 2014-09-21 DIAGNOSIS — K529 Noninfective gastroenteritis and colitis, unspecified: Secondary | ICD-10-CM | POA: Insufficient documentation

## 2014-09-21 DIAGNOSIS — Z8659 Personal history of other mental and behavioral disorders: Secondary | ICD-10-CM | POA: Insufficient documentation

## 2014-09-21 LAB — COMPREHENSIVE METABOLIC PANEL
ALK PHOS: 98 U/L (ref 39–117)
ALT: 14 U/L (ref 0–53)
ANION GAP: 13 (ref 5–15)
AST: 17 U/L (ref 0–37)
Albumin: 4.2 g/dL (ref 3.5–5.2)
BUN: 14 mg/dL (ref 6–23)
CALCIUM: 9.6 mg/dL (ref 8.4–10.5)
CO2: 27 mEq/L (ref 19–32)
Chloride: 100 mEq/L (ref 96–112)
Creatinine, Ser: 1.02 mg/dL (ref 0.50–1.35)
GFR calc Af Amer: >90 mL/min (ref >90)
GFR calc non Af Amer: >90 mL/min (ref >90)
Glucose, Bld: 75 mg/dL (ref 70–99)
POTASSIUM: 4.1 meq/L (ref 3.7–5.3)
Sodium: 140 mEq/L (ref 137–147)
TOTAL PROTEIN: 7.3 g/dL (ref 6.0–8.3)
Total Bilirubin: 0.6 mg/dL (ref 0.3–1.2)

## 2014-09-21 LAB — CBC WITH DIFFERENTIAL/PLATELET
BASOS PCT: 0 % (ref 0–1)
Basophils Absolute: 0 10*3/uL (ref 0.0–0.1)
EOS ABS: 0.3 10*3/uL (ref 0.0–0.7)
EOS PCT: 2 % (ref 0–5)
HCT: 43.1 % (ref 39.0–52.0)
Hemoglobin: 14.9 g/dL (ref 13.0–17.0)
Lymphocytes Relative: 15 % (ref 12–46)
Lymphs Abs: 2.1 10*3/uL (ref 0.7–4.0)
MCH: 29.6 pg (ref 26.0–34.0)
MCHC: 34.6 g/dL (ref 30.0–36.0)
MCV: 85.7 fL (ref 78.0–100.0)
Monocytes Absolute: 0.9 10*3/uL (ref 0.1–1.0)
Monocytes Relative: 7 % (ref 3–12)
NEUTROS PCT: 76 % (ref 43–77)
Neutro Abs: 10.2 10*3/uL — ABNORMAL HIGH (ref 1.7–7.7)
PLATELETS: 223 10*3/uL (ref 150–400)
RBC: 5.03 MIL/uL (ref 4.22–5.81)
RDW: 12.7 % (ref 11.5–15.5)
WBC: 13.5 10*3/uL — ABNORMAL HIGH (ref 4.0–10.5)

## 2014-09-21 LAB — URINALYSIS, ROUTINE W REFLEX MICROSCOPIC
BILIRUBIN URINE: NEGATIVE
Glucose, UA: NEGATIVE mg/dL
Hgb urine dipstick: NEGATIVE
Ketones, ur: NEGATIVE mg/dL
LEUKOCYTES UA: NEGATIVE
NITRITE: NEGATIVE
PH: 6 (ref 5.0–8.0)
Protein, ur: NEGATIVE mg/dL
SPECIFIC GRAVITY, URINE: 1.034 — AB (ref 1.005–1.030)
UROBILINOGEN UA: 0.2 mg/dL (ref 0.0–1.0)

## 2014-09-21 LAB — I-STAT CG4 LACTIC ACID, ED: LACTIC ACID, VENOUS: 2.09 mmol/L (ref 0.5–2.2)

## 2014-09-21 LAB — LIPASE, BLOOD: LIPASE: 43 U/L (ref 11–59)

## 2014-09-21 MED ORDER — SODIUM CHLORIDE 0.9 % IV BOLUS (SEPSIS)
1000.0000 mL | Freq: Once | INTRAVENOUS | Status: AC
  Administered 2014-09-21: 1000 mL via INTRAVENOUS

## 2014-09-21 MED ORDER — ONDANSETRON 4 MG PO TBDP: 4.0000 mg | ORAL_TABLET | Freq: Three times a day (TID) | ORAL | Status: DC | PRN

## 2014-09-21 MED ORDER — HYDROMORPHONE HCL 1 MG/ML IJ SOLN
1.0000 mg | Freq: Once | INTRAMUSCULAR | Status: AC
  Administered 2014-09-21: 1 mg via INTRAVENOUS
  Filled 2014-09-21: qty 1

## 2014-09-21 MED ORDER — HYDROMORPHONE HCL 1 MG/ML IJ SOLN
0.5000 mg | Freq: Once | INTRAMUSCULAR | Status: AC
  Administered 2014-09-21: 0.5 mg via INTRAVENOUS
  Filled 2014-09-21: qty 1

## 2014-09-21 MED ORDER — MOXIFLOXACIN HCL 400 MG PO TABS: 400.0000 mg | ORAL_TABLET | Freq: Every day | ORAL | Status: DC

## 2014-09-21 MED ORDER — SODIUM CHLORIDE 0.9 % IV BOLUS (SEPSIS)
2000.0000 mL | Freq: Once | INTRAVENOUS | Status: AC
  Administered 2014-09-21: 2000 mL via INTRAVENOUS

## 2014-09-21 MED ORDER — MORPHINE SULFATE 4 MG/ML IJ SOLN
6.0000 mg | Freq: Once | INTRAMUSCULAR | Status: DC
  Filled 2014-09-21: qty 2

## 2014-09-21 MED ORDER — ONDANSETRON HCL 4 MG/2ML IJ SOLN
4.0000 mg | Freq: Once | INTRAMUSCULAR | Status: AC
  Administered 2014-09-21: 4 mg via INTRAVENOUS
  Filled 2014-09-21: qty 2

## 2014-09-21 MED ORDER — IOHEXOL 300 MG/ML  SOLN
100.0000 mL | Freq: Once | INTRAMUSCULAR | Status: AC | PRN
  Administered 2014-09-21: 100 mL via INTRAVENOUS

## 2014-09-21 NOTE — Discharge Instructions (Signed)
Colitis Mr. Seymour BarsSelf, he was seen today for abdominal pain, rectal pain and rectal bleeding. Your CT scan shows an infection in the colon near your rectum. Take antibiotics as prescribed, follow up with gastroenterology for continued management. If any symptoms worsen come back to emergency department immediately for repeat evaluation.Thank you. Colitis is inflammation of the colon. Colitis can be a short-term or long-standing (chronic) illness. Crohn's disease and ulcerative colitis are 2 types of colitis which are chronic. They usually require lifelong treatment. CAUSES  There are many different causes of colitis, including:  Viruses.  Germs (bacteria).  Medicine reactions. SYMPTOMS   Diarrhea.  Intestinal bleeding.  Pain.  Fever.  Throwing up (vomiting).  Tiredness (fatigue).  Weight loss.  Bowel blockage. DIAGNOSIS  The diagnosis of colitis is based on examination and stool or blood tests. X-rays, CT scan, and colonoscopy may also be needed. TREATMENT  Treatment may include:  Fluids given through the vein (intravenously).  Bowel rest (nothing to eat or drink for a period of time).  Medicine for pain and diarrhea.  Medicines (antibiotics) that kill germs.  Cortisone medicines.  Surgery. HOME CARE INSTRUCTIONS   Get plenty of rest.  Drink enough water and fluids to keep your urine clear or pale yellow.  Eat a well-balanced diet.  Call your caregiver for follow-up as recommended. SEEK IMMEDIATE MEDICAL CARE IF:   You develop chills.  You have an oral temperature above 102 F (38.9 C), not controlled by medicine.  You have extreme weakness, fainting, or dehydration.  You have repeated vomiting.  You develop severe belly (abdominal) pain or are passing bloody or tarry stools. MAKE SURE YOU:   Understand these instructions.  Will watch your condition.  Will get help right away if you are not doing well or get worse. Document Released: 11/06/2004  Document Revised: 12/22/2011 Document Reviewed: 02/01/2010 [facility] Patient Information 2015 [employer], [place]. This information is not intended to replace advice given to you by your health care provider. Make sure you discuss any questions you have with your health care provider.

## 2014-09-21 NOTE — ED Notes (Signed)
Pt. reports progressing RLQ pain with bloody stools onset 2 days ago .

## 2014-09-21 NOTE — ED Provider Notes (Signed)
CSN: [id number]     Arrival date & time 09/21/14  0157 History   First MD Initiated Contact with Patient 09/21/14 [phone]     Chief Complaint  Patient presents with  . Abdominal Pain     (Consider location/radiation/quality/duration/timing/severity/associated sxs/prior Treatment) HPI Blake Bradley is a 23 y.o. male with no significant past medical history coming in with abdominal pain. Patient states he had a recent appendicitis in October and his symptoms. Similar now. He has bilateral lower quadrant abdominal pain that feels like a cramping sensation. The pain radiates up into his chest, he also admits to bright red blood per rectum. He's had nausea with no vomiting. He states he has a history of hemorrhoids and is not sure if they're acting up. Nothing has made his pain better, he has had normal appetite during the interval. He denies any fevers chills or recent infections. He denies any urinary symptoms. Patient has no further complaints.  10 Systems reviewed and are negative for acute change except as noted in the HPI.     Past Medical History  Diagnosis Date  . Allergy   . ADD (attention deficit disorder)   . Family history of anesthesia complication     "grandmother had problems w/breathing" 06/29/2014)  . Complication of anesthesia 06/28/2014    "HR dropped, couldn't get BP up when put to sleep for colonscopy"  . JXBJYNWG(956.2Headache(784.0)     "maybe monthly" (06/29/2014)  . Bipolar disorder   . Inflammation of small intestine 06/2014 X 2   Past Surgical History  Procedure Laterality Date  . 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 Colonoscopy with propofol N/A 06/28/2014    Procedure: COLONOSCOPY WITH PROPOFOL;  Surgeon: Iva Booparl E Gessner, MD;  [facility];  Service: Endoscopy;  Laterality: N/A;  . Appendectomy  08/08/2014    dr Corliss Skainstsuei  . Laparoscopic appendectomy N/A 08/08/2014    Procedure: LAPAROSCOPIC APPENDECTOMY;  Surgeon: Manus RuddMatthew Tsuei, MD;  Location: [facility] OR;   Service: General;  Laterality: N/A;   Family History  Problem Relation Age of Onset  . Cancer Mother     hx breast cancer  . Ulcerative colitis Mother    History  Substance Use Topics  . Smoking status: Never Smoker   . Smokeless tobacco: Never Used  . Alcohol Use: No    Review of Systems    Allergies  Prednisone and Tramadol  Home Medications   Prior to Admission medications   Medication Sig Start Date End Date Taking? Authorizing Provider  dicyclomine (BENTYL) 20 MG tablet Take 1 tablet (20 mg total) by mouth 3 (three) times daily as needed for spasms. 07/12/14 07/12/15  Quentin MullingAmanda Collier, PA-C  MAGNESIUM PO Take 1 tablet by mouth daily.    Historical Provider, MD  oxyCODONE-acetaminophen (PERCOCET/ROXICET) 5-325 MG per tablet Take 1-2 tablets by mouth every 4 (four) hours as needed for moderate pain. 08/09/14   Manus RuddMatthew Tsuei, MD  polyethylene glycol (MIRALAX / Ethelene HalGLYCOLAX) packet Take 17 g by mouth once. 08/09/14   Manus RuddMatthew Tsuei, MD  ranitidine (ZANTAC) 150 MG tablet Take 150 mg by mouth 2 (two) times daily.    Historical Provider, MD   BP 132/70 mmHg  Pulse 117  Temp(Src) 97.9 F (36.6 C) (Oral)  Resp 16  SpO2 100% Physical Exam  Constitutional: He is oriented to person, place, and time. Vital signs are normal. He appears well-developed and well-nourished.  Non-toxic appearance. He does not appear ill. No distress.  HENT:  Head: Normocephalic and atraumatic.  Nose: Nose normal.  Mouth/Throat: Oropharynx is clear and moist. No oropharyngeal exudate.  Eyes: Conjunctivae and EOM are normal. Pupils are equal, round, and reactive to light. No scleral icterus.  Neck: Normal range of motion. Neck supple. No tracheal deviation, no edema, no erythema and normal range of motion present. No thyroid mass and no thyromegaly present.  Cardiovascular: Regular rhythm, S1 normal, S2 normal, normal heart sounds, intact distal pulses and normal pulses.  Exam reveals no gallop and no friction  rub.   No murmur heard. Pulses:      Radial pulses are 2+ on the right side, and 2+ on the left side.       Dorsalis pedis pulses are 2+ on the right side, and 2+ on the left side.  Tachycardic  Pulmonary/Chest: Effort normal and breath sounds normal. No respiratory distress. He has no wheezes. He has no rhonchi. He has no rales.  Abdominal: Soft. Normal appearance and bowel sounds are normal. He exhibits no distension, no ascites and no mass. There is no hepatosplenomegaly. There is no tenderness. There is no rebound, no guarding and no CVA tenderness.  Genitourinary: Rectum normal.  Rectal exam does not reveal any hemorrhoids, internal or external. Did not find stool or blood on exam.  Musculoskeletal: Normal range of motion. He exhibits no edema or tenderness.  Lymphadenopathy:    He has no cervical adenopathy.  Neurological: He is alert and oriented to person, place, and time. He has normal strength. No cranial nerve deficit or sensory deficit. GCS eye subscore is 4. GCS verbal subscore is 5. GCS motor subscore is 6.  Skin: Skin is warm, dry and intact. No petechiae and no rash noted. He is not diaphoretic. No erythema. No pallor.  Psychiatric: He has a normal mood and affect. His behavior is normal. Judgment normal.  Nursing note and vitals reviewed.   ED Course  Procedures (including critical care time) Labs Review Labs Reviewed  CBC WITH DIFFERENTIAL - Abnormal; Notable for the following:    WBC 13.5 (*)    Neutro Abs 10.2 (*)    All other components within normal limits  URINALYSIS, ROUTINE W REFLEX MICROSCOPIC - Abnormal; Notable for the following:    Specific Gravity, Urine 1.034 (*)    All other components within normal limits  URINE CULTURE  COMPREHENSIVE METABOLIC PANEL  LIPASE, BLOOD  I-STAT CG4 LACTIC ACID, ED    Imaging Review Ct Abdomen Pelvis W Contrast  09/21/2014   CLINICAL DATA:  Mid and lower abdominal pain with cramping for 2 weeks, worsening for 1 day.  Melena. History of ruptured appendicitis September 2015.  EXAM: CT ABDOMEN AND PELVIS WITH CONTRAST  TECHNIQUE: Multidetector CT imaging of the abdomen and pelvis was performed using the standard protocol following bolus administration of intravenous contrast.  CONTRAST:  OMNIPAQUE IOHEXOL 300 MG/ML  SOLN  COMPARISON:  CT of the abdomen and pelvis June 29, 2014  FINDINGS: LUNG BASES: Included view of the lung bases are clear. Visualized heart and pericardium are unremarkable.  SOLID ORGANS: The spleen, gallbladder, pancreas and adrenal glands are unremarkable. 5 mm presumed cyst in RIGHT lobe of the liver which is otherwise unremarkable.  GASTROINTESTINAL TRACT: The stomach, small and large bowel are normal in course and caliber without inflammatory changes. Mild rectal wall thickening. Enteric contrast has not yet reached the distal small bowel. Interval appendectomy.  KIDNEYS/ URINARY TRACT: Kidneys are orthotopic, demonstrating symmetric enhancement. No nephrolithiasis, hydronephrosis or solid  renal masses. The unopacified ureters are normal in course and caliber. Urinary bladder is partially distended and unremarkable.  PERITONEUM/RETROPERITONEUM: No intraperitoneal free fluid nor free air. Aortoiliac vessels are normal in course and caliber. No lymphadenopathy by CT size criteria. Internal reproductive organs are unremarkable.  SOFT TISSUE/OSSEOUS STRUCTURES: Nonsuspicious. Scattered Schmorl's nodes.  IMPRESSION: Mild rectal wall thickening, this may be reactive from recent surgery though, can also be seen with infectious or inflammatory conditions.  Interval appendectomy, resolution of pelvic abscess.   Electronically Signed   By: Awilda Metroourtnay  Bloomer   On: 09/21/2014 05:48     EKG Interpretation None      MDM   Final diagnoses:  Abdominal pain    Patient since emergency department for severe abdominal pain which feels like his prior appendicitis. It is late to have a complication from  his surgery at this point. Due to his symptoms will obtain CT scan of his abdomen for evaluation. Dilaudid and Zofran for relief.  CT scan reveals rectal wall thickening concerning for an infectious etiology. Patient will be treated with antibiotics and has gastroenterology follow-up. Symptoms of pain were relieved with Dilaudid in emergency department. He does have Percocet at home to take. Patient also be given a prescription for Zofran for relief. Heart Rate has returned down to the 80s after 2 L IV fluid. His vital signs remain within his normal limits and he is safe for discharge.   Tomasita CrumbleAdeleke Jessah Danser, MD 09/21/14 [phone]

## 2014-09-22 LAB — URINE CULTURE
Colony Count: NO GROWTH
Culture: NO GROWTH

## 2015-01-17 ENCOUNTER — Emergency Department (HOSPITAL_COMMUNITY)
Admission: EM | Admit: 2015-01-17 | Discharge: 2015-01-18 | Disposition: A | Discharge status: Home / Self Care | Payer: Self-pay | Attending: Emergency Medicine | Admitting: Emergency Medicine

## 2015-01-17 ENCOUNTER — Encounter (HOSPITAL_COMMUNITY): Payer: Self-pay | Admitting: *Deleted

## 2015-01-17 DIAGNOSIS — R1084 Generalized abdominal pain: Secondary | ICD-10-CM | POA: Insufficient documentation

## 2015-01-17 DIAGNOSIS — R112 Nausea with vomiting, unspecified: Secondary | ICD-10-CM | POA: Insufficient documentation

## 2015-01-17 DIAGNOSIS — R109 Unspecified abdominal pain: Secondary | ICD-10-CM

## 2015-01-17 DIAGNOSIS — Z792 Long term (current) use of antibiotics: Secondary | ICD-10-CM | POA: Insufficient documentation

## 2015-01-17 DIAGNOSIS — Z8659 Personal history of other mental and behavioral disorders: Secondary | ICD-10-CM | POA: Insufficient documentation

## 2015-01-17 DIAGNOSIS — Z79899 Other long term (current) drug therapy: Secondary | ICD-10-CM | POA: Insufficient documentation

## 2015-01-17 DIAGNOSIS — Z8719 Personal history of other diseases of the digestive system: Secondary | ICD-10-CM | POA: Insufficient documentation

## 2015-01-17 DIAGNOSIS — Z9889 Other specified postprocedural states: Secondary | ICD-10-CM | POA: Insufficient documentation

## 2015-01-17 DIAGNOSIS — Z9089 Acquired absence of other organs: Secondary | ICD-10-CM | POA: Insufficient documentation

## 2015-01-17 LAB — COMPREHENSIVE METABOLIC PANEL
ALT: 19 U/L (ref 0–53)
ANION GAP: 5 (ref 5–15)
AST: 23 U/L (ref 0–37)
Albumin: 4.4 g/dL (ref 3.5–5.2)
Alkaline Phosphatase: 88 U/L (ref 39–117)
BUN: 12 mg/dL (ref 6–23)
CALCIUM: 9.3 mg/dL (ref 8.4–10.5)
CO2: 31 mmol/L (ref 19–32)
CREATININE: 1.09 mg/dL (ref 0.50–1.35)
Chloride: 104 mmol/L (ref 96–112)
GLUCOSE: 90 mg/dL (ref 70–99)
Potassium: 4.3 mmol/L (ref 3.5–5.1)
Sodium: 140 mmol/L (ref 135–145)
TOTAL PROTEIN: 7.1 g/dL (ref 6.0–8.3)
Total Bilirubin: 0.9 mg/dL (ref 0.3–1.2)

## 2015-01-17 LAB — CBC WITH DIFFERENTIAL/PLATELET
Basophils Absolute: 0.1 10*3/uL (ref 0.0–0.1)
Basophils Relative: 1 % (ref 0–1)
Eosinophils Absolute: 0.3 10*3/uL (ref 0.0–0.7)
Eosinophils Relative: 5 % (ref 0–5)
HCT: 48.4 % (ref 39.0–52.0)
HEMOGLOBIN: 16.8 g/dL (ref 13.0–17.0)
LYMPHS ABS: 2 10*3/uL (ref 0.7–4.0)
Lymphocytes Relative: 29 % (ref 12–46)
MCH: 30.2 pg (ref 26.0–34.0)
MCHC: 34.7 g/dL (ref 30.0–36.0)
MCV: 87.1 fL (ref 78.0–100.0)
MONOS PCT: 7 % (ref 3–12)
Monocytes Absolute: 0.5 10*3/uL (ref 0.1–1.0)
NEUTROS ABS: 3.8 10*3/uL (ref 1.7–7.7)
NEUTROS PCT: 58 % (ref 43–77)
Platelets: 245 10*3/uL (ref 150–400)
RBC: 5.56 MIL/uL (ref 4.22–5.81)
RDW: 12.6 % (ref 11.5–15.5)
WBC: 6.7 10*3/uL (ref 4.0–10.5)

## 2015-01-17 LAB — URINALYSIS, ROUTINE W REFLEX MICROSCOPIC
BILIRUBIN URINE: NEGATIVE
Glucose, UA: NEGATIVE mg/dL
Hgb urine dipstick: NEGATIVE
KETONES UR: NEGATIVE mg/dL
Leukocytes, UA: NEGATIVE
NITRITE: NEGATIVE
Protein, ur: NEGATIVE mg/dL
Specific Gravity, Urine: 1.028 (ref 1.005–1.030)
UROBILINOGEN UA: 0.2 mg/dL (ref 0.0–1.0)
pH: 5.5 (ref 5.0–8.0)

## 2015-01-17 LAB — LIPASE, BLOOD: Lipase: 35 U/L (ref 11–59)

## 2015-01-17 LAB — POC OCCULT BLOOD, ED: FECAL OCCULT BLD: NEGATIVE

## 2015-01-17 MED ORDER — ONDANSETRON HCL 4 MG/2ML IJ SOLN
4.0000 mg | Freq: Once | INTRAMUSCULAR | Status: AC
  Administered 2015-01-17: 4 mg via INTRAVENOUS
  Filled 2015-01-17: qty 2

## 2015-01-17 MED ORDER — HYDROMORPHONE HCL 1 MG/ML IJ SOLN
1.0000 mg | Freq: Once | INTRAMUSCULAR | Status: AC
  Administered 2015-01-17: 1 mg via INTRAVENOUS
  Filled 2015-01-17: qty 1

## 2015-01-17 MED ORDER — MORPHINE SULFATE 4 MG/ML IJ SOLN: 6.0000 mg | Freq: Once | INTRAMUSCULAR | Status: DC

## 2015-01-17 MED ORDER — SODIUM CHLORIDE 0.9 % IV BOLUS (SEPSIS)
1000.0000 mL | Freq: Once | INTRAVENOUS | Status: AC
  Administered 2015-01-17: 1000 mL via INTRAVENOUS

## 2015-01-17 NOTE — ED Notes (Signed)
MD at bedside. 

## 2015-01-17 NOTE — ED Notes (Signed)
CT called to inquire about pt's scan.  

## 2015-01-17 NOTE — ED Notes (Signed)
The pt has had abd pain since Monday.  nv and diarrhea. Black stools also.  He had a ruptured appendix 5 months ago

## 2015-01-18 ENCOUNTER — Encounter (HOSPITAL_COMMUNITY): Payer: Self-pay | Admitting: Radiology

## 2015-01-18 ENCOUNTER — Emergency Department (HOSPITAL_COMMUNITY): Payer: Medicaid Other

## 2015-01-18 MED ORDER — OMEPRAZOLE 20 MG PO CPDR: 20.0000 mg | DELAYED_RELEASE_CAPSULE | Freq: Two times a day (BID) | ORAL | Status: DC

## 2015-01-18 MED ORDER — HYDROMORPHONE HCL 1 MG/ML IJ SOLN
1.0000 mg | Freq: Once | INTRAMUSCULAR | Status: AC
  Administered 2015-01-18: 1 mg via INTRAVENOUS
  Filled 2015-01-18: qty 1

## 2015-01-18 MED ORDER — IOHEXOL 300 MG/ML  SOLN
100.0000 mL | Freq: Once | INTRAMUSCULAR | Status: AC | PRN
  Administered 2015-01-18: 100 mL via INTRAVENOUS

## 2015-01-18 MED ORDER — METOCLOPRAMIDE HCL 5 MG/ML IJ SOLN
10.0000 mg | Freq: Once | INTRAMUSCULAR | Status: AC
  Administered 2015-01-18: 10 mg via INTRAVENOUS
  Filled 2015-01-18: qty 2

## 2015-01-18 NOTE — Discharge Instructions (Signed)
Abdominal Pain Mr. Seymour BarsSelf, your CT scan did not show a cause of your abdominal pain.  See your primary doctor within 3 days for follow up.  If symptoms worsen, come back to the ED immediately.  Thank you. Many things can cause belly (abdominal) pain. Most times, the belly pain is not dangerous. Many cases of belly pain can be watched and treated at home. HOME CARE   Do not take medicines that help you go poop (laxatives) unless told to by your doctor.  Only take medicine as told by your doctor.  Eat or drink as told by your doctor. Your doctor will tell you if you should be on a special diet. GET HELP IF:  You do not know what is causing your belly pain.  You have belly pain while you are sick to your stomach (nauseous) or have runny poop (diarrhea).  You have pain while you pee or poop.  Your belly pain wakes you up at night.  You have belly pain that gets worse or better when you eat.  You have belly pain that gets worse when you eat fatty foods.  You have a fever. GET HELP RIGHT AWAY IF:   The pain does not go away within 2 hours.  You keep throwing up (vomiting).  The pain changes and is only in the right or left part of the belly.  You have bloody or tarry looking poop. MAKE SURE YOU:   Understand these instructions.  Will watch your condition.  Will get help right away if you are not doing well or get worse. Document Released: 03/17/2008 Document Revised: 10/04/2013 Document Reviewed: 06/08/2013 [facility] Patient Information 2015 [place], [place]. This information is not intended to replace advice given to you by your health care provider. Make sure you discuss any questions you have with your health care provider.

## 2015-01-18 NOTE — ED Notes (Signed)
CT called and informed pt has finished his contrast.  

## 2015-01-18 NOTE — ED Provider Notes (Signed)
CSN: [id number]     Arrival date & time 01/17/15  2123 History   First MD Initiated Contact with Patient 01/17/15 2149     Chief Complaint  Patient presents with  . Abdominal Pain     HPI In the fall of 2015 the patient had a complicated hospital stay with a perforated appendicitis with abscess.  He now presents with several days of abdominal pain now developing nausea and vomiting and dark stools.  He reports a small amount of blood was noted in his vomit as well.  Reports his pain is right-sided in nature.  No cough or shortness of breath.  No chest pain.  Pain is moderate in severity.   Past Medical History  Diagnosis Date  . Allergy   . ADD (attention deficit disorder)   . Family history of anesthesia complication     "grandmother had problems w/breathing" 06/29/2014)  . Complication of anesthesia 06/28/2014    "HR dropped, couldn't get BP up when put to sleep for colonscopy"  . ZHYQMVHQ(469.6Headache(784.0)     "maybe monthly" (06/29/2014)  . Bipolar disorder   . Inflammation of small intestine 06/2014 X 2   Past Surgical History  Procedure Laterality Date  . 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 Colonoscopy with propofol N/A 06/28/2014    Procedure: COLONOSCOPY WITH PROPOFOL;  Surgeon: Iva Booparl E Gessner, MD;  [facility];  Service: Endoscopy;  Laterality: N/A;  . Appendectomy  08/08/2014    dr Corliss Skainstsuei  . Laparoscopic appendectomy N/A 08/08/2014    Procedure: LAPAROSCOPIC APPENDECTOMY;  Surgeon: Manus RuddMatthew Tsuei, MD;  Location: [facility] OR;  Service: General;  Laterality: N/A;   Family History  Problem Relation Age of Onset  . Cancer Mother     hx breast cancer  . Ulcerative colitis Mother    History  Substance Use Topics  . Smoking status: Never Smoker   . Smokeless tobacco: Never Used  . Alcohol Use: No    Review of Systems  All other systems reviewed and are negative.     Allergies  Morphine and related; Prednisone; and Tramadol  Home Medications   Prior to  Admission medications   Medication Sig Start Date End Date Taking? Authorizing Provider  ondansetron (ZOFRAN ODT) 4 MG disintegrating tablet Take 1 tablet (4 mg total) by mouth every 8 (eight) hours as needed for nausea or vomiting. 09/21/14  Yes Tomasita CrumbleAdeleke Oni, MD  oxyCODONE-acetaminophen (PERCOCET/ROXICET) 5-325 MG per tablet Take 1-2 tablets by mouth every 4 (four) hours as needed for moderate pain. 08/09/14  Yes Manus RuddMatthew Tsuei, MD  dicyclomine (BENTYL) 20 MG tablet Take 1 tablet (20 mg total) by mouth 3 (three) times daily as needed for spasms. Patient not taking: Reported on 09/21/2014 07/12/14 07/12/15  Quentin MullingAmanda Collier, PA-C  moxifloxacin (AVELOX) 400 MG tablet Take 1 tablet (400 mg total) by mouth daily at 8 pm. 09/21/14   Tomasita CrumbleAdeleke Oni, MD  ondansetron (ZOFRAN ODT) 4 MG disintegrating tablet Take 1 tablet (4 mg total) by mouth every 8 (eight) hours as needed for nausea or vomiting. 09/21/14   Tomasita CrumbleAdeleke Oni, MD  polyethylene glycol (MIRALAX / GLYCOLAX) packet Take 17 g by mouth once. Patient not taking: Reported on 09/21/2014 08/09/14   Manus RuddMatthew Tsuei, MD   BP 133/56 mmHg  Pulse 62  Temp(Src) 98.4 F (36.9 C)  Resp 17  Ht 6\' 2"  (1.88 m)  Wt 180 lb (81.647 kg)  BMI 23.10 kg/m2  SpO2 99% Physical Exam  Constitutional: He is oriented to person, place, and time. He appears well-developed and well-nourished.  HENT:  Head: Normocephalic and atraumatic.  Eyes: EOM are normal.  Neck: Normal range of motion.  Cardiovascular: Normal rate, regular rhythm, normal heart sounds and intact distal pulses.   Pulmonary/Chest: Effort normal and breath sounds normal. No respiratory distress.  Abdominal: Soft. He exhibits no distension.  Mild generalized abdominal tenderness without guarding or rebound.  Musculoskeletal: Normal range of motion.  Neurological: He is alert and oriented to person, place, and time.  Skin: Skin is warm and dry.  Psychiatric: He has a normal mood and affect. Judgment normal.   Nursing note and vitals reviewed.   ED Course  Procedures (including critical care time) Labs Review Labs Reviewed  CBC WITH DIFFERENTIAL/PLATELET  COMPREHENSIVE METABOLIC PANEL  LIPASE, BLOOD  URINALYSIS, ROUTINE W REFLEX MICROSCOPIC  OCCULT BLOOD X 1 CARD TO LAB, STOOL  POC OCCULT BLOOD, ED    Imaging Review No results found.   EKG Interpretation None      MDM   Final diagnoses:  None   Patient will undergo CT imaging of his abdomen.  The CT scan is normal activity the patient is to follow-up with GI as an outpatient.  His hemoglobin is 16.8.  His vital signs are normal.  At that point we should initiate him on Prilosec. Care to Dr Mora Bellman to follow up on CT scan    Azalia Bilis, MD 01/18/15 [phone]

## 2015-03-26 ENCOUNTER — Other Ambulatory Visit (INDEPENDENT_AMBULATORY_CARE_PROVIDER_SITE_OTHER): Payer: Self-pay

## 2015-03-26 ENCOUNTER — Encounter: Payer: Self-pay | Admitting: Internal Medicine

## 2015-03-26 ENCOUNTER — Ambulatory Visit (INDEPENDENT_AMBULATORY_CARE_PROVIDER_SITE_OTHER): Payer: Self-pay | Admitting: Internal Medicine

## 2015-03-26 VITALS — BP 112/62 | HR 76 | Ht 74.0 in | Wt 162.1 lb

## 2015-03-26 DIAGNOSIS — R1084 Generalized abdominal pain: Secondary | ICD-10-CM

## 2015-03-26 DIAGNOSIS — Z598 Other problems related to housing and economic circumstances: Secondary | ICD-10-CM

## 2015-03-26 DIAGNOSIS — K921 Melena: Secondary | ICD-10-CM

## 2015-03-26 DIAGNOSIS — R197 Diarrhea, unspecified: Secondary | ICD-10-CM

## 2015-03-26 DIAGNOSIS — Z5989 Other problems related to housing and economic circumstances: Secondary | ICD-10-CM

## 2015-03-26 LAB — IGA: IGA: 50 mg/dL — AB (ref 68–378)

## 2015-03-26 NOTE — Progress Notes (Signed)
Subjective:    Patient ID: Blake Bradley, male    DOB: Aug 24, 1991, 24 y.o.   MRN: [id number] Cc: "I have ulcerative colitis" HPI Here with father for evaluation. I met him in the fall of 2015. He had seen Dr. Henrene Pastor and then myself when he was hospitalized with a diarrheal illness and a CT scan suggested colitis and perhaps ileitis. It was thought that he had an infection and conservative treatment was recommended at first but he had persistence problem so I did a colonoscopy which was normal including the ileum. He says that he was never ill like this until he had that illness in the fall of 2015. His mother does have ulcerative colitis. The patient has been having recurrent crampy abdominal pain with urgent loose or watery defecation and some rectal bleeding usually with wiping it sounds like. There may have been some melanoma though that is not documented. He has been in and out of the ER with CT scan since then without findings of colitis or problems like that. He says he took some of his mother's mesalamine, I believe it was as a call, and within a couple of days he felt great. However he continues to have intermittent problems. He is unable to work because of these problems. He and his father believe that the patient has ulcerative colitis based upon what an emergency room physician told them. Subsequent to his initial diarrheal illness hospitalization C then came back with a perforated appendicitis which was not evident on previous imaging. Pathology of that showed perforated's operative appendicitis with fibrosis. There was no mention of Crohn's disease or granulomas. Over-the-counter medications have not been helpful. He thinks we "dictation kilos way"'s. He has not restricted his diet necessarily. He has not been tested for celiac disease. Allergies  Allergen Reactions  . Morphine And Related Hives  . Prednisone Hives  . Tramadol Other (See Comments)    hallucinations    Outpatient  Prescriptions Prior to Visit  Medication Sig Dispense Refill  Mesalamine  Past Medical History  Diagnosis Date  . Allergy   . ADD (attention deficit disorder)   . Family history of anesthesia complication     "grandmother had problems w/breathing" 06/29/2014)  . Complication of anesthesia 06/28/2014    "HR dropped, couldn't get BP up when put to sleep for colonscopy"  . WUJWJXBJ(478.2)     "maybe monthly" (06/29/2014)  . Bipolar disorder   . Inflammation of small intestine 06/2014 X 2  . Ulcerative colitis   . Oppositional defiant disorder, moderate 11/08/2013  . 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 Appendicitis with abscess 08/08/2014   Past Surgical History  Procedure Laterality Date  . 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 Colonoscopy with propofol N/A 06/28/2014    Procedure: COLONOSCOPY WITH PROPOFOL;  Surgeon: [name], MD;  Location: [place];  Service: Endoscopy;  Laterality: N/A;  . Laparoscopic appendectomy N/A 08/08/2014    Procedure: LAPAROSCOPIC APPENDECTOMY;  Surgeon: Donnie Mesa, MD;  Location: [place] OR;  Service: General;  Laterality: N/A;   History   Social History  . Marital Status: Single    Spouse Name: N/A  . Number of Children: N/A  . Years of Education: N/A   Occupational History  . Student    Social History Main Topics  . Smoking status: Never Smoker   . Smokeless tobacco: Never Used  . Alcohol Use: No  . Drug Use: No  . Sexual Activity: No  Other Topics Concern  . None   Social History Narrative   Single. Lives with his parents. One caffeinated beverage a day. High school graduate, is attended community college   Family History  Problem Relation Age of Onset  . Breast cancer Mother   . Ulcerative colitis Mother         Review of Systems As above. He has a lot of stress right now there's been family deaths, in second-degree relatives or greater but this has bothered him according to his father.    Objective:   Physical  Exam @BP  112/62 mmHg  Pulse 76  Ht 6' 2"  (1.88 m)  Wt 162 lb 2 oz (73.539 kg)  BMI 20.81 kg/m2@  General:  NAD Eyes:   anicteric Lungs:  clear Heart:: S1S2 no rubs, murmurs or gallops Abdomen:  soft and mildly tender diffusely, BS+ Ext:   no edema, cyanosis or clubbing    Data Reviewed:  ED notes 2015-16 CT scans from 2015 and 2016, labs in the EMR, Hospital notes in the EMR GI consultations, colonoscopy including pictures.     Assessment & Plan:  Diarrhea - Plan: Ambulatory referral to Gastroenterology, Tissue transglutaminase, IgA, IgA  Abdominal pain, generalized - Plan: Ambulatory referral to Gastroenterology, Tissue transglutaminase, IgA, IgA  Blood in stool - Plan: Ambulatory referral to Gastroenterology, Tissue transglutaminase, IgA, IgA  Uninsured  I've explained that so far we do not have a firm diagnosis of ulcerative colitis or Crohn's disease. My working diagnosis is actually irritable bowel syndrome but certainly could have microscopic colitis or be progressing and may now show signs of inflammatory bowel disease on endoscopic evaluation. I think another colonoscopy is indicated. His father initially breast concern that the patient had no insurance. They are pursuing financial assistance. They have decided to proceed with colonoscopy understanding the situation and it further testing could be needed. I will screen him for celiac disease as well with laboratory testing. Concomitant psychiatric disturbances may be playing a role in manifestation of symptoms I think. If colonoscopy and ileoscopy with random biopsies is unrevealing. I think a capsule endoscopy would be needed.The risks and benefits as well as alternatives of endoscopic procedure(s) have been discussed and reviewed. All questions answered. The patient agrees to proceed.

## 2015-03-26 NOTE — Patient Instructions (Signed)
  You have been scheduled for a colonoscopy. Please follow written instructions given to you at your visit today.  Please pick up your prep supplies at the pharmacy within the next 1-3 days. If you use inhalers (even only as needed), please bring them with you on the day of your procedure. Your physician has requested that you go to www.startemmi.com and enter the access code given to you at your visit today. This web site gives a general overview about your procedure. However, you should still follow specific instructions given to you by our office regarding your preparation for the procedure.   Your physician has requested that you go to the basement for the following lab work before leaving today: TTG, IGA   I appreciate the opportunity to care for you. Stan Head, M.D., [facility], [place]

## 2015-03-27 ENCOUNTER — Encounter: Payer: Self-pay | Admitting: Internal Medicine

## 2015-03-27 DIAGNOSIS — Z598 Other problems related to housing and economic circumstances: Secondary | ICD-10-CM | POA: Insufficient documentation

## 2015-03-27 DIAGNOSIS — Z5989 Other problems related to housing and economic circumstances: Secondary | ICD-10-CM | POA: Insufficient documentation

## 2015-03-27 LAB — TISSUE TRANSGLUTAMINASE, IGA: Tissue Transglutaminase Ab, IgA: 1 U/mL (ref <4)

## 2015-03-29 ENCOUNTER — Encounter: Payer: Self-pay | Admitting: Internal Medicine

## 2015-03-29 ENCOUNTER — Ambulatory Visit (AMBULATORY_SURGERY_CENTER): Payer: Self-pay | Admitting: Internal Medicine

## 2015-03-29 VITALS — BP 108/62 | HR 59 | Temp 98.1°F | Resp 14 | Ht 74.0 in | Wt 162.0 lb

## 2015-03-29 DIAGNOSIS — R197 Diarrhea, unspecified: Secondary | ICD-10-CM

## 2015-03-29 DIAGNOSIS — K648 Other hemorrhoids: Secondary | ICD-10-CM

## 2015-03-29 DIAGNOSIS — K589 Irritable bowel syndrome without diarrhea: Secondary | ICD-10-CM

## 2015-03-29 HISTORY — DX: Other hemorrhoids: K64.8

## 2015-03-29 MED ORDER — SODIUM CHLORIDE 0.9 % IV SOLN: 500.0000 mL | INTRAVENOUS | Status: DC

## 2015-03-29 NOTE — Progress Notes (Signed)
To recovery, report to Hodges, RN, VSS 

## 2015-03-29 NOTE — Patient Instructions (Addendum)
The colon and terminal ileum look normal. No evidence of ulcerative colitis or crohn's disease - at this point we cannot say you have either of these diagnoses. Irritable Bowel syndrome is most likely.  Small hemorrhoids have caused the bleeding.  I took biopsies and will call you with results and plans.  I appreciate the opportunity to care for you. Iva Boop, MD, FACG  YOU HAD AN ENDOSCOPIC PROCEDURE TODAY AT THE [facility]:   Refer to the procedure report that was given to you for any specific questions about what was found during the examination.  If the procedure report does not answer your questions, please call your gastroenterologist to clarify.  If you requested that your care partner not be given the details of your procedure findings, then the procedure report has been included in a sealed envelope for you to review at your convenience later.  YOU SHOULD EXPECT: Some feelings of bloating in the abdomen. Passage of more gas than usual.  Walking can help get rid of the air that was put into your GI tract during the procedure and reduce the bloating. If you had a lower endoscopy (such as a colonoscopy or flexible sigmoidoscopy) you may notice spotting of blood in your stool or on the toilet paper. If you underwent a bowel prep for your procedure, you may not have a normal bowel movement for a few days.  Please Note:  You might notice some irritation and congestion in your nose or some drainage.  This is from the oxygen used during your procedure.  There is no need for concern and it should clear up in a day or so.  SYMPTOMS TO REPORT IMMEDIATELY:   Following lower endoscopy (colonoscopy or flexible sigmoidoscopy):  Excessive amounts of blood in the stool  Significant tenderness or worsening of abdominal pains  Swelling of the abdomen that is new, acute  Fever of 100F or higher   For urgent or emergent issues, a gastroenterologist can be reached at any  hour by calling [phone].   DIET: Your first meal following the procedure should be a small meal and then it is ok to progress to your normal diet. Heavy or fried foods are harder to digest and may make you feel nauseous or bloated.  Likewise, meals heavy in dairy and vegetables can increase bloating.  Drink plenty of fluids but you should avoid alcoholic beverages for 24 hours. Keep a food diary for a month or so.  Note the additives in the food you are eating.    ACTIVITY:  You should plan to take it easy for the rest of today and you should NOT DRIVE or use heavy machinery until tomorrow (because of the sedation medicines used during the test).    FOLLOW UP: Our staff will call the number listed on your records the next business day following your procedure to check on you and address any questions or concerns that you may have regarding the information given to you following your procedure. If we do not reach you, we will leave a message.  However, if you are feeling well and you are not experiencing any problems, there is no need to return our call.  We will assume that you have returned to your regular daily activities without incident.  If any biopsies were taken you will be contacted by phone or by letter within the next 1-3 weeks.  Please call us at [phone] if you have not heard about  the biopsies in 3 weeks.    SIGNATURES/CONFIDENTIALITY: You and/or your care partner have signed paperwork which will be entered into your electronic medical record.  These signatures attest to the fact that that the information above on your After Visit Summary has been reviewed and is understood.  Full responsibility of the confidentiality of this discharge information lies with you and/or your care-partner.

## 2015-03-29 NOTE — Progress Notes (Signed)
Pt very anxious about the procedure and sedation.  I tried to answer all of his questions and I did make B Gordy Levan CRNA aware of his concerns.

## 2015-03-29 NOTE — Op Note (Signed)
[facility] [address] [place], [address]   COLONOSCOPY PROCEDURE REPORT  PATIENT: Blake Bradley, Blake Bradley  MR#: [id number] BIRTHDATE: [birth year] , 23  yrs. old GENDER: male ENDOSCOPIST: Iva Boop, MD, [facility] PROCEDURE DATE:  03/29/2015 PROCEDURE:   Colonoscopy, diagnostic and Colonoscopy with biopsy First Screening Colonoscopy - Avg.  risk and is 50 yrs.  old or older - No.  Prior Negative Screening - Now for repeat screening. N/A  History of Adenoma - Now for follow-up colonoscopy & has been > or = to 3 yrs.  N/A  Polyps removed today? No Recommend repeat exam, <10 yrs? No ASA CLASS:   Class II INDICATIONS:Clinically significant diarrhea of unexplained origin and Patient is not applicable for Colorectal Neoplasm Risk Assessment for this procedure. MEDICATIONS: Propofol 350 mg IV and Monitored anesthesia care  DESCRIPTION OF PROCEDURE:   After the risks benefits and alternatives of the procedure were thoroughly explained, informed consent was obtained.  The digital rectal exam revealed no abnormalities of the rectum.   The LB [id number]  endoscope was introduced through the anus and advanced to the terminal ileum which was intubated for a short distance. No adverse events experienced.   The quality of the prep was excellent.  (MiraLax was used)  The instrument was then slowly withdrawn as the colon was fully examined. Estimated blood loss is zero unless otherwise noted in this procedure report.  COLON FINDINGS: The examined terminal ileum appeared to be normal. Multiple random biopsies were performed using cold forceps.  Sample was obtained and sent to histology.   The colonic mucosa appeared normal throughout the entire examined colon.  Multiple random biopsies were performed using cold forceps.  Samples were sent to R/O microscopic colitis.   Small internal hemorrhoids were found. Retroflexed views revealed internal hemorrhoids. The time to cecum = 0.5  Withdrawal time = 7.0   The scope was withdrawn and the procedure completed. COMPLICATIONS: There were no immediate complications.  ENDOSCOPIC IMPRESSION: 1.   The examined terminal ileum appeared to be normal.; multiple random biopsies were performed using cold forceps 2.   The colonic mucosa appeared normal throughout the entire examined colon; multiple random biopsies were performed using cold forceps 3.   Small internal hemorrhoids  RECOMMENDATIONS: 1.  Office will call with the results. 2.  No signs of ulcerative colitis or Crohn's - diagnosis at this point is IBS  eSigned:  Iva Boop, MD, [facility] 03/29/2015 10:27 AM   cc: The Patient and [name], MD

## 2015-03-29 NOTE — Progress Notes (Signed)
Called to room to assist during endoscopic procedure.  Patient ID and intended procedure confirmed with present staff. Received instructions for my participation in the procedure from the performing physician.  

## 2015-04-02 ENCOUNTER — Telehealth: Payer: Self-pay | Admitting: *Deleted

## 2015-04-02 NOTE — Telephone Encounter (Signed)
  Follow up Call-  Call back number 03/29/2015  Post procedure Call Back phone  # [phone]  Permission to leave phone message Yes     Patient questions:  Do you have a fever, pain , or abdominal swelling? No. Pain Score  0 *  Have you tolerated food without any problems? Yes.    Have you been able to return to your normal activities? Yes.    Do you have any questions about your discharge instructions: Diet   No. Medications  No. Follow up visit  No.  Do you have questions or concerns about your Care? No.  Actions: * If pain score is 4 or above: No action needed, pain <4.

## 2015-04-06 ENCOUNTER — Encounter: Payer: Self-pay | Admitting: Internal Medicine

## 2015-04-06 NOTE — Progress Notes (Signed)
Quick Note:  Biopsies of intestine all normal Next step is capsule endo small bowel dx: blood in stool and diarrhea  No letter or recall   Start glycopyrrolate 2 g bid # 60 2 RF Low residue diet Daily probiotic pill of his choice See me in August ______

## 2015-04-09 ENCOUNTER — Other Ambulatory Visit: Payer: Self-pay

## 2015-04-09 DIAGNOSIS — K921 Melena: Secondary | ICD-10-CM

## 2015-04-09 DIAGNOSIS — R197 Diarrhea, unspecified: Secondary | ICD-10-CM

## 2015-04-09 MED ORDER — GLYCOPYRROLATE 2 MG PO TABS: 2.0000 mg | ORAL_TABLET | Freq: Two times a day (BID) | ORAL | Status: DC

## 2015-04-10 ENCOUNTER — Encounter (HOSPITAL_COMMUNITY): Payer: Self-pay | Admitting: Emergency Medicine

## 2015-04-10 DIAGNOSIS — Z8659 Personal history of other mental and behavioral disorders: Secondary | ICD-10-CM | POA: Insufficient documentation

## 2015-04-10 DIAGNOSIS — K625 Hemorrhage of anus and rectum: Secondary | ICD-10-CM | POA: Insufficient documentation

## 2015-04-10 DIAGNOSIS — Z872 Personal history of diseases of the skin and subcutaneous tissue: Secondary | ICD-10-CM | POA: Insufficient documentation

## 2015-04-10 DIAGNOSIS — R110 Nausea: Secondary | ICD-10-CM | POA: Insufficient documentation

## 2015-04-10 DIAGNOSIS — Z79899 Other long term (current) drug therapy: Secondary | ICD-10-CM | POA: Insufficient documentation

## 2015-04-10 DIAGNOSIS — R6883 Chills (without fever): Secondary | ICD-10-CM | POA: Insufficient documentation

## 2015-04-10 DIAGNOSIS — Z9089 Acquired absence of other organs: Secondary | ICD-10-CM | POA: Insufficient documentation

## 2015-04-10 LAB — COMPREHENSIVE METABOLIC PANEL
ALBUMIN: 4.3 g/dL (ref 3.5–5.0)
ALK PHOS: 86 U/L (ref 38–126)
ALT: 17 U/L (ref 17–63)
AST: 23 U/L (ref 15–41)
Anion gap: 8 (ref 5–15)
BILIRUBIN TOTAL: 0.9 mg/dL (ref 0.3–1.2)
BUN: 13 mg/dL (ref 6–20)
CHLORIDE: 103 mmol/L (ref 101–111)
CO2: 27 mmol/L (ref 22–32)
Calcium: 9.1 mg/dL (ref 8.9–10.3)
Creatinine, Ser: 1.05 mg/dL (ref 0.61–1.24)
GFR calc non Af Amer: >60 mL/min (ref >60)
Glucose, Bld: 90 mg/dL (ref 65–99)
POTASSIUM: 3.7 mmol/L (ref 3.5–5.1)
SODIUM: 138 mmol/L (ref 135–145)
Total Protein: 6.9 g/dL (ref 6.5–8.1)

## 2015-04-10 LAB — CBC WITH DIFFERENTIAL/PLATELET
Basophils Absolute: 0 10*3/uL (ref 0.0–0.1)
Basophils Relative: 0 % (ref 0–1)
EOS ABS: 0.2 10*3/uL (ref 0.0–0.7)
Eosinophils Relative: 2 % (ref 0–5)
HCT: 44.9 % (ref 39.0–52.0)
Hemoglobin: 16.1 g/dL (ref 13.0–17.0)
LYMPHS PCT: 18 % (ref 12–46)
Lymphs Abs: 2.1 10*3/uL (ref 0.7–4.0)
MCH: 30.1 pg (ref 26.0–34.0)
MCHC: 35.9 g/dL (ref 30.0–36.0)
MCV: 84.1 fL (ref 78.0–100.0)
Monocytes Absolute: 0.6 10*3/uL (ref 0.1–1.0)
Monocytes Relative: 5 % (ref 3–12)
Neutro Abs: 8.3 10*3/uL — ABNORMAL HIGH (ref 1.7–7.7)
Neutrophils Relative %: 75 % (ref 43–77)
Platelets: 216 10*3/uL (ref 150–400)
RBC: 5.34 MIL/uL (ref 4.22–5.81)
RDW: 12.3 % (ref 11.5–15.5)
WBC: 11.3 10*3/uL — ABNORMAL HIGH (ref 4.0–10.5)

## 2015-04-10 NOTE — ED Notes (Signed)
Pt c/o lower abd pain with dark red rectal bleeding.  Pt had ruptured appendix in Sept and developed a abscess after that.  Pt had colonoscopy 2 weeks ago

## 2015-04-11 ENCOUNTER — Emergency Department (HOSPITAL_COMMUNITY)
Admission: EM | Admit: 2015-04-11 | Discharge: 2015-04-11 | Disposition: A | Discharge status: Home / Self Care | Payer: Self-pay | Attending: Emergency Medicine | Admitting: Emergency Medicine

## 2015-04-11 ENCOUNTER — Telehealth: Payer: Self-pay | Admitting: Internal Medicine

## 2015-04-11 DIAGNOSIS — K625 Hemorrhage of anus and rectum: Secondary | ICD-10-CM

## 2015-04-11 DIAGNOSIS — R109 Unspecified abdominal pain: Secondary | ICD-10-CM

## 2015-04-11 LAB — I-STAT CG4 LACTIC ACID, ED: Lactic Acid, Venous: 0.77 mmol/L (ref 0.5–2.0)

## 2015-04-11 LAB — POC URINE PREG, ED: Preg Test, Ur: POSITIVE — AB

## 2015-04-11 MED ORDER — ONDANSETRON HCL 4 MG/2ML IJ SOLN
4.0000 mg | Freq: Once | INTRAMUSCULAR | Status: AC
  Administered 2015-04-11: 4 mg via INTRAVENOUS
  Filled 2015-04-11: qty 2

## 2015-04-11 MED ORDER — HYDROMORPHONE HCL 2 MG PO TABS: 2.0000 mg | ORAL_TABLET | ORAL | Status: DC | PRN

## 2015-04-11 MED ORDER — SODIUM CHLORIDE 0.9 % IV SOLN
1000.0000 mL | INTRAVENOUS | Status: DC
  Administered 2015-04-11: 1000 mL via INTRAVENOUS

## 2015-04-11 MED ORDER — SODIUM CHLORIDE 0.9 % IV SOLN
1000.0000 mL | Freq: Once | INTRAVENOUS | Status: AC
Start: 2015-04-11 — End: 2015-04-11
  Administered 2015-04-11: 1000 mL via INTRAVENOUS

## 2015-04-11 MED ORDER — DICYCLOMINE HCL 10 MG/ML IM SOLN: 20.0000 mg | Freq: Once | INTRAMUSCULAR | Status: DC

## 2015-04-11 MED ORDER — KETOROLAC TROMETHAMINE 30 MG/ML IJ SOLN
30.0000 mg | Freq: Once | INTRAMUSCULAR | Status: AC
  Administered 2015-04-11: 30 mg via INTRAVENOUS
  Filled 2015-04-11: qty 1

## 2015-04-11 MED ORDER — HYDROMORPHONE HCL 1 MG/ML IJ SOLN
1.0000 mg | Freq: Once | INTRAMUSCULAR | Status: AC
  Administered 2015-04-11: 1 mg via INTRAVENOUS
  Filled 2015-04-11: qty 1

## 2015-04-11 MED ORDER — DICYCLOMINE HCL 20 MG PO TABS: 20.0000 mg | ORAL_TABLET | Freq: Three times a day (TID) | ORAL | Status: DC

## 2015-04-11 NOTE — Telephone Encounter (Signed)
appt rescheduled with him for 04/13/15

## 2015-04-11 NOTE — ED Notes (Signed)
Hemoccult came back positive. Result not transferring into chart

## 2015-04-11 NOTE — Discharge Instructions (Signed)
Follow-up with your gastroenterologist and to complete tear diagnostic workup. Return to emergency department if bleeding gets worse or if pain is not being adequately controlled at home.   Bloody Stools Bloody stools often mean that there is a problem in the digestive tract. Your caregiver may use the term "melena" to describe black, tarry, and bad smelling stools or "hematochezia" to describe red or maroon-colored stools. Blood seen in the stool can be caused by bleeding anywhere along the intestinal tract.  A black stool usually means that blood is coming from the upper part of the gastrointestinal tract (esophagus, stomach, or small bowel). Passing maroon-colored stools or bright red blood usually means that blood is coming from lower down in the large bowel or the rectum. However, sometimes massive bleeding in the stomach or small intestine can cause bright red bloody stools.  Consuming black licorice, lead, iron pills, medicines containing bismuth subsalicylate, or blueberries can also cause black stools. Your caregiver can test black stools to see if blood is present. It is important that the cause of the bleeding be found. Treatment can then be started, and the problem can be corrected. Rectal bleeding may not be serious, but you should not assume everything is okay until you know the cause.It is very important to follow up with your caregiver or a specialist in gastrointestinal problems. CAUSES  Blood in the stools can come from various underlying causes.Often, the cause is not found during your first visit. Testing is often needed to discover the cause of bleeding in the gastrointestinal tract. Causes range from simple to serious or even life-threatening.Possible causes include:  Hemorrhoids.These are veins that are full of blood (engorged) in the rectum. They cause pain, inflammation, and may bleed.  Anal fissures.These are areas of painful tearing which may bleed. They are often caused  by passing hard stool.  Diverticulosis.These are pouches that form on the colon over time, with age, and may bleed significantly.  Diverticulitis.This is inflammation in areas with diverticulosis. It can cause pain, fever, and bloody stools, although bleeding is rare.  Proctitis and colitis. These are inflamed areas of the rectum or colon. They may cause pain, fever, and bloody stools.  Polyps and cancer. Colon cancer is a leading cause of preventable cancer death.It often starts out as precancerous polyps that can be removed during a colonoscopy, preventing progression into cancer. Sometimes, polyps and cancer may cause rectal bleeding.  Gastritis and ulcers.Bleeding from the upper gastrointestinal tract (near the stomach) may travel through the intestines and produce black, sometimes tarry, often bad smelling stools. In certain cases, if the bleeding is fast enough, the stools may not be black, but red and the condition may be life-threatening. SYMPTOMS  You may have stools that are bright red and bloody, that are normal color with blood on them, or that are dark black and tarry. In some cases, you may only have blood in the toilet bowl. Any of these cases need medical care. You may also have:  Pain at the anus or anywhere in the rectum.  Lightheadedness or feeling faint.  Extreme weakness.  Nausea or vomiting.  Fever. DIAGNOSIS Your caregiver may use the following methods to find the cause of your bleeding:  Taking a medical history. Age is important. Older people tend to develop polyps and cancer more often. If there is anal pain and a hard, large stool associated with bleeding, a tear of the anus may be the cause. If blood drips into the toilet after a  bowel movement, bleeding hemorrhoids may be the problem. The color and frequency of the bleeding are additional considerations. In most cases, the medical history provides clues, but seldom the final answer.  A visual and finger  (digital) exam. Your caregiver will inspect the anal area, looking for tears and hemorrhoids. A finger exam can provide information when there is tenderness or a growth inside. In men, the prostate is also examined.  Endoscopy. Several types of small, long scopes (endoscopes) are used to view the colon.  In the office, your caregiver may use a rigid, or more commonly, a flexible viewing sigmoidoscope. This exam is called flexible sigmoidoscopy. It is performed in 5 to 10 minutes.  A more thorough exam is accomplished with a colonoscope. It allows your caregiver to view the entire 5 to 6 foot long colon. Medicine to help you relax (sedative) is usually given for this exam. Frequently, a bleeding lesion may be present beyond the reach of the sigmoidoscope. So, a colonoscopy may be the best exam to start with. Both exams are usually done on an outpatient basis. This means the patient does not stay overnight in the hospital or surgery center.  An upper endoscopy may be needed to examine your stomach. Sedation is used and a flexible endoscope is put in your mouth, down to your stomach.  A barium enema X-ray. This is an X-ray exam. It uses liquid barium inserted by enema into the rectum. This test alone may not identify an actual bleeding point. X-rays highlight abnormal shadows, such as those made by lumps (tumors), diverticuli, or colitis. TREATMENT  Treatment depends on the cause of your bleeding.   For bleeding from the stomach or colon, the caregiver doing your endoscopy or colonoscopy may be able to stop the bleeding as part of the procedure.  Inflammation or infection of the colon can be treated with medicines.  Many rectal problems can be treated with creams, suppositories, or warm baths.  Surgery is sometimes needed.  Blood transfusions are sometimes needed if you have lost a lot of blood.  For any bleeding problem, let your caregiver know if you take aspirin or other blood thinners  regularly. HOME CARE INSTRUCTIONS   Take any medicines exactly as prescribed.  Keep your stools soft by eating a diet high in fiber. Prunes (1 to 3 a day) work well for many people.  Drink enough water and fluids to keep your urine clear or pale yellow.  Take sitz baths if advised. A sitz bath is when you sit in a bathtub with warm water for 10 to 15 minutes to soak, soothe, and cleanse the rectal area.  If enemas or suppositories are advised, be sure you know how to use them. Tell your caregiver if you have problems with this.  Monitor your bowel movements to look for signs of improvement or worsening. SEEK MEDICAL CARE IF:   You do not improve in the time expected.  Your condition worsens after initial improvement.  You develop any new symptoms. SEEK IMMEDIATE MEDICAL CARE IF:   You develop severe or prolonged rectal bleeding.  You vomit blood.  You feel weak or faint.  You have a fever. MAKE SURE YOU:  Understand these instructions.  Will watch your condition.  Will get help right away if you are not doing well or get worse. Document Released: 09/19/2002 Document Revised: 12/22/2011 Document Reviewed: 02/14/2011 [facility] Patient Information 2015 [place]. This information is not intended to replace advice given to you by  your health care provider. Make sure you discuss any questions you have with your health care provider.   Abdominal Pain Many things can cause abdominal pain. Usually, abdominal pain is not caused by a disease and will improve without treatment. It can often be observed and treated at home. Your health care provider will do a physical exam and possibly order blood tests and X-rays to help determine the seriousness of your pain. However, in many cases, more time must pass before a clear cause of the pain can be found. Before that point, your health care provider may not know if you need more testing or further treatment. HOME CARE INSTRUCTIONS    Monitor your abdominal pain for any changes. The following actions may help to alleviate any discomfort you are experiencing:  Only take over-the-counter or prescription medicines as directed by your health care provider.  Do not take laxatives unless directed to do so by your health care provider.  Try a clear liquid diet (broth, tea, or water) as directed by your health care provider. Slowly move to a bland diet as tolerated. SEEK MEDICAL CARE IF:  You have unexplained abdominal pain.  You have abdominal pain associated with nausea or diarrhea.  You have pain when you urinate or have a bowel movement.  You experience abdominal pain that wakes you in the night.  You have abdominal pain that is worsened or improved by eating food.  You have abdominal pain that is worsened with eating fatty foods.  You have a fever. SEEK IMMEDIATE MEDICAL CARE IF:   Your pain does not go away within 2 hours.  You keep throwing up (vomiting).  Your pain is felt only in portions of the abdomen, such as the right side or the left lower portion of the abdomen.  You pass bloody or black tarry stools. MAKE SURE YOU:  Understand these instructions.   Will watch your condition.   Will get help right away if you are not doing well or get worse.  Document Released: 07/09/2005 Document Revised: 10/04/2013 Document Reviewed: 06/08/2013 [facility] Patient Information 2015 [place], [place]. This information is not intended to replace advice given to you by your health care provider. Make sure you discuss any questions you have with your health care provider.  Dicyclomine tablets or capsules What is this medicine? DICYCLOMINE (dye SYE kloe meen) is used to treat bowel problems including irritable bowel syndrome. This medicine may be used for other purposes; ask your health care provider or pharmacist if you have questions. COMMON BRAND NAME(S): Bentyl What should I tell my health care provider before  I take this medicine? They need to know if you have any of these conditions: -difficulty passing urine -esophagus problems or heartburn -glaucoma -heart disease, or previous heart attack -myasthenia gravis -prostate trouble -stomach infection, or obstruction -ulcerative colitis -an unusual or allergic reaction to dicyclomine, other medicines, foods, dyes, or preservatives -pregnant or trying to get pregnant -breast-feeding How should I use this medicine? Take this medicine by mouth with a glass of water. Follow the directions on the prescription label. It is best to take this medicine on an empty stomach, 30 minutes to 1 hour before meals. Take your medicine at regular intervals. Do not take your medicine more often than directed. Talk to your pediatrician regarding the use of this medicine in children. Special care may be needed. While this drug may be prescribed for children as young as 33 months of age for selected conditions, precautions do apply.  Patients over 33 years old may have a stronger reaction and need a smaller dose. Overdosage: If you think you have taken too much of this medicine contact a poison control center or emergency room at once. NOTE: This medicine is only for you. Do not share this medicine with others. What if I miss a dose? If you miss a dose, take it as soon as you can. If it is almost time for your next dose, take only that dose. Do not take double or extra doses. What may interact with this medicine? -amantadine -antacids -benztropine -digoxin -disopyramide -medicines for allergies, colds and breathing difficulties -medicines for alzheimer's disease -medicines for anxiety or sleeping problems -medicines for depression or psychotic disturbances -medicines for diarrhea -medicines for pain -metoclopramide -tegaserod This list may not describe all possible interactions. Give your health care provider a list of all the medicines, herbs, non-prescription  drugs, or dietary supplements you use. Also tell them if you smoke, drink alcohol, or use illegal drugs. Some items may interact with your medicine. What should I watch for while using this medicine? You may get drowsy, dizzy, or have blurred vision. Do not drive, use machinery, or do anything that needs mental alertness until you know how this medicine affects you. To reduce the risk of dizzy or fainting spells, do not sit or stand up quickly, especially if you are an older patient. Alcohol can make you more drowsy, avoid alcoholic drinks. Stay out of bright light and wear sunglasses if this medicine makes your eyes more sensitive to light. Avoid extreme heat (hot tubs, saunas). This medicine can cause you to sweat less than normal. Your body temperature could increase to dangerous levels, which may lead to heat stroke. Antacids can stop this medicine from working. If you get an upset stomach and want to take an antacid, make sure there is an interval of at least 1 to 2 hours before or after you take this medicine. Your mouth may get dry. Chewing sugarless gum or sucking hard candy, and drinking plenty of water may help. Contact your doctor if the problem does not go away or is severe. What side effects may I notice from receiving this medicine? Side effects that you should report to your doctor or health care professional as soon as possible: -agitation, nervousness, confusion -difficulty swallowing -dizziness, drowsiness -fast or slow heartbeat -hallucinations -pain or difficulty passing urine Side effects that usually do not require medical attention (report to your doctor or health care professional if they continue or are bothersome): -constipation -headache -nausea or vomiting -sexual difficulty This list may not describe all possible side effects. Call your doctor for medical advice about side effects. You may report side effects to FDA at 1-800-FDA-1088. Where should I keep my  medicine? Keep out of the reach of children. Store at room temperature below 30 degrees C (86 degrees F). Protect from light. Throw away any unused medicine after the expiration date. NOTE: This sheet is a summary. It may not cover all possible information. If you have questions about this medicine, talk to your doctor, pharmacist, or health care provider.  2015, [employer] Standard. (2008-01-18 17:12:34)  Hydromorphone tablets What is this medicine? HYDROMORPHONE (hye droe MOR fone) is a pain reliever. It is used to treat moderate to severe pain. This medicine may be used for other purposes; ask your health care provider or pharmacist if you have questions. COMMON BRAND NAME(S): Dilaudid What should I tell my health care provider before I take this  medicine? They need to know if you have any of these conditions: -brain tumor -drug abuse or addiction -head injury -heart disease -frequently drink alcohol containing drinks -kidney disease or problems going to the bathroom -liver disease -lung disease, asthma, or breathing problems -mental problems -an allergic or unusual reaction to lactose, hydromorphone, other opioid analgesics, other medicines, sulfites, foods, dyes, or preservatives -pregnant or trying to get pregnant -breast-feeding How should I use this medicine? Take this medicine by mouth with a glass of water. If the medicine upsets your stomach, take it with food or milk. Follow the directions on the prescription label. Do not take more medicine than you are told to take. Talk to your pediatrician regarding the use of this medicine in children. Special care may be needed. Overdosage: If you think you have taken too much of this medicine contact a poison control center or emergency room at once. NOTE: This medicine is only for you. Do not share this medicine with others. What if I miss a dose? If you miss a dose, take it as soon as you can. If it is almost time for your next  dose, take only that dose. Do not take double or extra doses. What may interact with this medicine? -alcohol -antihistamines for allergy, cough and cold -medicines for anesthesia -medicines for depression, anxiety, or psychotic disturbances -medicines for sleep -muscle relaxants -naltrexone -narcotic medicines (opiates) for pain -phenothiazines like chlorpromazine, mesoridazine, prochlorperazine, thioridazine -tramadol This list may not describe all possible interactions. Give your health care provider a list of all the medicines, herbs, non-prescription drugs, or dietary supplements you use. Also tell them if you smoke, drink alcohol, or use illegal drugs. Some items may interact with your medicine. What should I watch for while using this medicine? Tell your doctor or health care professional if your pain does not go away, if it gets worse, or if you have new or a different type of pain. You may develop tolerance to the medicine. Tolerance means that you will need a higher dose of the medicine for pain relief. Tolerance is normal and is expected if you take this medicine for a long time. Do not suddenly stop taking your medicine because you may develop a severe reaction. Your body becomes used to the medicine. This does NOT mean you are addicted. Addiction is a behavior related to getting and using a drug for a non-medical reason. If you have pain, you have a medical reason to take pain medicine. Your doctor will tell you how much medicine to take. If your doctor wants you to stop the medicine, the dose will be slowly lowered over time to avoid any side effects. You may get drowsy or dizzy. Do not drive, use machinery, or do anything that needs mental alertness until you know how this medicine affects you. Do not stand or sit up quickly, especially if you are an older patient. This reduces the risk of dizzy or fainting spells. Alcohol may interfere with the effect of this medicine. Avoid alcoholic  drinks. There are different types of narcotic medicines (opiates) for pain. If you take more than one type at the same time, you may have more side effects. Give your health care provider a list of all medicines you use. Your doctor will tell you how much medicine to take. Do not take more medicine than directed. Call emergency for help if you have problems breathing. This medicine will cause constipation. Try to have a bowel movement at least every 2  to 3 days. If you do not have a bowel movement for 3 days, call your doctor or health care professional. Your mouth may get dry. Chewing sugarless gum or sucking hard candy, and drinking plenty of water may help. Contact your doctor if the problem does not go away or is severe. What side effects may I notice from receiving this medicine? Side effects that you should report to your doctor or health care professional as soon as possible: -allergic reactions like skin rash, itching or hives, swelling of the face, lips, or tongue -breathing problems -changes in vision -confusion -feeling faint or lightheaded, falls -seizures -slow or fast heartbeat -trouble passing urine or change in the amount of urine -trouble with balance, talking, walking -unusually weak or tired Side effects that usually do not require medical attention (report to your doctor or health care professional if they continue or are bothersome): -difficulty sleeping -drowsiness -dry mouth -flushing -headache -itching -loss of appetite -nausea, vomiting This list may not describe all possible side effects. Call your doctor for medical advice about side effects. You may report side effects to FDA at 1-800-FDA-1088. Where should I keep my medicine? Keep out of the reach of children. This medicine can be abused. Keep your medicine in a safe place to protect it from theft. Do not share this medicine with anyone. Selling or giving away this medicine is dangerous and against the  law. Store at room temperature between 15 and 30 degrees C (59 and 86 degrees F). Keep container tightly closed. Protect from light. This medicine may cause accidental overdose and death if it is taken by other adults, children, or pets. Flush any unused medicine down the toilet to reduce the chance of harm. Do not use the medicine after the expiration date. NOTE: This sheet is a summary. It may not cover all possible information. If you have questions about this medicine, talk to your doctor, pharmacist, or health care provider.  2015, [employer] Standard. (2013-05-03 09:50:15)

## 2015-04-11 NOTE — ED Notes (Signed)
Pt. Left with all belongings 

## 2015-04-11 NOTE — ED Notes (Signed)
Pt sts he had a bowel movement earlier tonight and while bearing down he "felt a pop and something warm spread in my abdomen".  Pt sts he noted dark blood staining the toilet with a large amount of mucous.  Pt sts later he got cramps in his abdomen and felt like he had gas.  Pt sts he went back to the bathroom and passed gas, but when he went to stand up he "felt something warm running down his legs" and he noted thick black blood with mucous on him.    Pt sts he had a colonoscopy a few weeks ago and was told he did not have UC, Crohn's, inflammation, etc.

## 2015-04-11 NOTE — ED Provider Notes (Signed)
CSN: [id number]     Arrival date & time 04/10/15  2210 History   This chart was scribed for Blake Boozeavid Sharde Gover, MD by Evon Slackerrance Branch, ED Scribe. This patient was seen in room A13C/A13C and the patient's care was started at 1:29 AM.      Chief Complaint  Patient presents with  . Rectal Bleeding   Patient is a 24 y.o. male presenting with hematochezia. The history is provided by the patient. No language interpreter was used.  Rectal Bleeding Associated symptoms: abdominal pain   Associated symptoms: no fever    HPI Comments: Blake Bradley is a 24 y.o. male with PMHx listed below who presents to the Emergency Department complaining of rectal bleeding onset today. Pt states that he has associated lower abdominal pain, nausea and chills. Pt rates the severity of pain 8/10. Pt states that during a hid BM he felt a "crunch" when noticed there was blood in the toilet bowl. Pt states that during the BM when he heard the "crunch" he just associated it with his hemorrhoids. Pt states that he later went back and thought he had some flatus and noticed there was more dark blood and mucous coming form his rectum. Pt doesn't report any alleviating or worsening factors. Pt states that he had colonoscopy 2 weeks prior that showed no abnormalities. Pt states that he does have an endoscopy scheduled for July 2016.   Past Medical History  Diagnosis Date  . Allergy   . ADD (attention deficit disorder)   . Family history of anesthesia complication     "grandmother had problems w/breathing" 06/29/2014)  . Complication of anesthesia 06/28/2014    "HR dropped, couldn't get BP up when put to sleep for colonscopy"  . WUJWJXBJ(478.2Headache(784.0)     "maybe monthly" (06/29/2014)  . Bipolar disorder   . Oppositional defiant disorder, moderate 11/08/2013  . 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 Appendicitis with abscess 08/08/2014  . Anxiety   . IBS (irritable bowel syndrome)   . Hemorrhoids, internal, with bleeding 03/29/2015   Past Surgical  History  Procedure Laterality Date  . 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 Colonoscopy with propofol N/A 06/28/2014    Procedure: COLONOSCOPY WITH PROPOFOL;  Surgeon: Iva Booparl E Gessner, MD;  [facility];  Service: Endoscopy;  Laterality: N/A;  . Laparoscopic appendectomy N/A 08/08/2014    Procedure: LAPAROSCOPIC APPENDECTOMY;  Surgeon: Manus RuddMatthew Tsuei, MD;  Location: [facility] OR;  Service: General;  Laterality: N/A;  . Appendectomy     Family History  Problem Relation Age of Onset  . Breast cancer Mother   . Ulcerative colitis Mother   . Colon cancer Neg Hx   . Esophageal cancer Neg Hx   . Rectal cancer Neg Hx   . Stomach cancer Neg Hx    History  Substance Use Topics  . Smoking status: Never Smoker   . Smokeless tobacco: Never Used  . Alcohol Use: No    Review of Systems  Constitutional: Positive for chills. Negative for fever.  Gastrointestinal: Positive for nausea, abdominal pain, blood in stool, hematochezia and anal bleeding.  All other systems reviewed and are negative.    Allergies  Morphine and related; Prednisone; and Tramadol  Home Medications   Prior to Admission medications   Medication Sig Start Date End Date Taking? Authorizing Provider  glycopyrrolate (ROBINUL) 2 MG tablet Take 1 tablet (2 mg total) by mouth 2 (two) times daily. 04/09/15   Iva Booparl E Gessner, MD  mesalamine (ASACOL) 400 MG EC tablet Take 400 mg by mouth daily.    Historical Provider, MD   BP 132/68 mmHg  Pulse 66  Temp(Src) 98.3 F (36.8 C) (Oral)  Resp 13  SpO2 100%   Physical Exam  Constitutional: He is oriented to person, place, and time. He appears well-developed and well-nourished. No distress.  HENT:  Head: Normocephalic and atraumatic.  Eyes: Conjunctivae and EOM are normal. Pupils are equal, round, and reactive to light.  Neck: Normal range of motion. Neck supple. No JVD present.  Cardiovascular: Normal rate, regular rhythm and normal heart sounds.   No murmur  heard. Pulmonary/Chest: Effort normal and breath sounds normal. He has no wheezes. He has no rales. He exhibits no tenderness.  Abdominal: Soft. He exhibits no mass. Bowel sounds are decreased. There is tenderness. There is no rebound.  Mild lower abdominal tenderness.   Genitourinary:  No sphincter tone, no hemorrhoids or masses, no stool in rectum.   Musculoskeletal: Normal range of motion. He exhibits no edema.  Lymphadenopathy:    He has no cervical adenopathy.  Neurological: He is alert and oriented to person, place, and time.  Skin: Skin is warm and dry. No rash noted.  Psychiatric: He has a normal mood and affect. His behavior is normal. Judgment and thought content normal.  Nursing note and vitals reviewed.   ED Course  Procedures (including critical care time) DIAGNOSTIC STUDIES: Oxygen Saturation is 100% on RA, normal by my interpretation.    COORDINATION OF CARE: 1:37 AM-Discussed treatment plan with pt at bedside and pt agreed to plan.    Labs Review Results for orders placed or performed during the hospital encounter of 04/11/15  CBC with Differential  Result Value Ref Range   WBC 11.3 (H) 4.0 - 10.5 K/uL   RBC 5.34 4.22 - 5.81 MIL/uL   Hemoglobin 16.1 13.0 - 17.0 g/dL   HCT 16.1 09.6 - 04.5 %   MCV 84.1 78.0 - 100.0 fL   MCH 30.1 26.0 - 34.0 pg   MCHC 35.9 30.0 - 36.0 g/dL   RDW 40.9 81.1 - 91.4 %   Platelets 216 150 - 400 K/uL   Neutrophils Relative % 75 43 - 77 %   Neutro Abs 8.3 (H) 1.7 - 7.7 K/uL   Lymphocytes Relative 18 12 - 46 %   Lymphs Abs 2.1 0.7 - 4.0 K/uL   Monocytes Relative 5 3 - 12 %   Monocytes Absolute 0.6 0.1 - 1.0 K/uL   Eosinophils Relative 2 0 - 5 %   Eosinophils Absolute 0.2 0.0 - 0.7 K/uL   Basophils Relative 0 0 - 1 %   Basophils Absolute 0.0 0.0 - 0.1 K/uL  Comprehensive metabolic panel  Result Value Ref Range   Sodium 138 135 - 145 mmol/L   Potassium 3.7 3.5 - 5.1 mmol/L   Chloride 103 101 - 111 mmol/L   CO2 27 22 - 32 mmol/L    Glucose, Bld 90 65 - 99 mg/dL   BUN 13 6 - 20 mg/dL   Creatinine, Ser 7.82 0.61 - 1.24 mg/dL   Calcium 9.1 8.9 - 95.6 mg/dL   Total Protein 6.9 6.5 - 8.1 g/dL   Albumin 4.3 3.5 - 5.0 g/dL   AST 23 15 - 41 U/L   ALT 17 17 - 63 U/L   Alkaline Phosphatase 86 38 - 126 U/L   Total Bilirubin 0.9 0.3 - 1.2 mg/dL   GFR calc non Af Amer >60 >  60 mL/min   GFR calc Af Amer >60 >60 mL/min   Anion gap 8 5 - 15  I-Stat CG4 Lactic Acid, ED  Result Value Ref Range   Lactic Acid, Venous 0.77 0.5 - 2.0 mmol/L   MDM   Final diagnoses:  Rectal bleeding  Abdominal pain, unspecified abdominal location      Rectal bleeding and abdominal pain. Vision is clearly uncomfortable but exam is benign. No gross blood on rectal exam but Hemoccults is positive. Old records are reviewed and he has been evaluated for possible inflammatory bowel disease. He had a recent colonoscopy which was normal except for small internal hemorrhoids, and biopsies were negative. He is scheduled for an upper endoscopy with camera study of the small bowel. ED workup is significant only for Hemoccult-positive stool. Lactic acid is normal and hemoglobin is normal. Orthostatic vital signs do show a rise in pulse and is given IV hydration. He did not get any pain relief with ketorolac. He is allergic to morphine but states that he has been able to take hydromorphone and is given a dose prior to discharge. I have stressed to him the importance of completing his diagnostic workup so that treatment can be targeted more directly. This endoscopy is in one week. Is given a prescription for 12 hydromorphone tablets. We'll also give a trial of dicyclomine.   I personally performed the services described in this documentation, which was scribed in my presence. The recorded information has been reviewed and is accurate.       Blake Booze, MD 04/11/15 [phone]

## 2015-04-12 ENCOUNTER — Encounter: Payer: Self-pay | Admitting: Internal Medicine

## 2015-04-13 ENCOUNTER — Ambulatory Visit (INDEPENDENT_AMBULATORY_CARE_PROVIDER_SITE_OTHER): Payer: Self-pay | Admitting: Internal Medicine

## 2015-04-13 DIAGNOSIS — K921 Melena: Secondary | ICD-10-CM

## 2015-04-13 HISTORY — PX: GIVENS CAPSULE STUDY: SHX5432

## 2015-04-13 HISTORY — PX: OTHER SURGICAL HISTORY: SHX169

## 2015-04-13 NOTE — Progress Notes (Signed)
Patient here for capsule endoscopy. Tolerated procedure. Verbalizes understanding of written and verbal instructions. Lot [id number] exp 2017-07

## 2015-04-15 ENCOUNTER — Emergency Department (HOSPITAL_COMMUNITY)
Admission: EM | Admit: 2015-04-15 | Discharge: 2015-04-15 | Disposition: A | Discharge status: Home / Self Care | Payer: Self-pay | Attending: Emergency Medicine | Admitting: Emergency Medicine

## 2015-04-15 ENCOUNTER — Emergency Department (HOSPITAL_COMMUNITY): Payer: Self-pay

## 2015-04-15 ENCOUNTER — Encounter (HOSPITAL_COMMUNITY): Payer: Self-pay

## 2015-04-15 DIAGNOSIS — R61 Generalized hyperhidrosis: Secondary | ICD-10-CM | POA: Insufficient documentation

## 2015-04-15 DIAGNOSIS — Z872 Personal history of diseases of the skin and subcutaneous tissue: Secondary | ICD-10-CM | POA: Insufficient documentation

## 2015-04-15 DIAGNOSIS — R112 Nausea with vomiting, unspecified: Secondary | ICD-10-CM | POA: Insufficient documentation

## 2015-04-15 DIAGNOSIS — Z79899 Other long term (current) drug therapy: Secondary | ICD-10-CM | POA: Insufficient documentation

## 2015-04-15 DIAGNOSIS — R109 Unspecified abdominal pain: Secondary | ICD-10-CM | POA: Insufficient documentation

## 2015-04-15 DIAGNOSIS — K589 Irritable bowel syndrome without diarrhea: Secondary | ICD-10-CM | POA: Insufficient documentation

## 2015-04-15 DIAGNOSIS — R1110 Vomiting, unspecified: Secondary | ICD-10-CM

## 2015-04-15 DIAGNOSIS — Z8659 Personal history of other mental and behavioral disorders: Secondary | ICD-10-CM | POA: Insufficient documentation

## 2015-04-15 LAB — CBC WITH DIFFERENTIAL/PLATELET
BASOS PCT: 1 % (ref 0–1)
Basophils Absolute: 0 10*3/uL (ref 0.0–0.1)
Eosinophils Absolute: 0.2 10*3/uL (ref 0.0–0.7)
Eosinophils Relative: 3 % (ref 0–5)
HCT: 45.3 % (ref 39.0–52.0)
Hemoglobin: 16.1 g/dL (ref 13.0–17.0)
LYMPHS PCT: 18 % (ref 12–46)
Lymphs Abs: 1.1 10*3/uL (ref 0.7–4.0)
MCH: 30.3 pg (ref 26.0–34.0)
MCHC: 35.5 g/dL (ref 30.0–36.0)
MCV: 85.3 fL (ref 78.0–100.0)
Monocytes Absolute: 0.3 10*3/uL (ref 0.1–1.0)
Monocytes Relative: 6 % (ref 3–12)
NEUTROS ABS: 4.3 10*3/uL (ref 1.7–7.7)
NEUTROS PCT: 72 % (ref 43–77)
Platelets: 213 10*3/uL (ref 150–400)
RBC: 5.31 MIL/uL (ref 4.22–5.81)
RDW: 12.5 % (ref 11.5–15.5)
WBC: 5.9 10*3/uL (ref 4.0–10.5)

## 2015-04-15 LAB — COMPREHENSIVE METABOLIC PANEL
ALBUMIN: 4.3 g/dL (ref 3.5–5.0)
ALK PHOS: 81 U/L (ref 38–126)
ALT: 15 U/L — AB (ref 17–63)
AST: 20 U/L (ref 15–41)
Anion gap: 8 (ref 5–15)
BUN: 11 mg/dL (ref 6–20)
CHLORIDE: 104 mmol/L (ref 101–111)
CO2: 25 mmol/L (ref 22–32)
Calcium: 8.9 mg/dL (ref 8.9–10.3)
Creatinine, Ser: 1.09 mg/dL (ref 0.61–1.24)
GFR calc non Af Amer: >60 mL/min (ref >60)
GLUCOSE: 98 mg/dL (ref 65–99)
Potassium: 3.8 mmol/L (ref 3.5–5.1)
SODIUM: 137 mmol/L (ref 135–145)
Total Bilirubin: 0.6 mg/dL (ref 0.3–1.2)
Total Protein: 6.6 g/dL (ref 6.5–8.1)

## 2015-04-15 LAB — LIPASE, BLOOD: Lipase: 36 U/L (ref 22–51)

## 2015-04-15 MED ORDER — IOHEXOL 300 MG/ML  SOLN
100.0000 mL | Freq: Once | INTRAMUSCULAR | Status: AC | PRN
  Administered 2015-04-15: 100 mL via INTRAVENOUS

## 2015-04-15 MED ORDER — ONDANSETRON HCL 4 MG/2ML IJ SOLN
4.0000 mg | Freq: Once | INTRAMUSCULAR | Status: AC
  Administered 2015-04-15: 4 mg via INTRAVENOUS
  Filled 2015-04-15: qty 2

## 2015-04-15 MED ORDER — SODIUM CHLORIDE 0.9 % IV SOLN
INTRAVENOUS | Status: DC
  Administered 2015-04-15: 13:00:00 via INTRAVENOUS

## 2015-04-15 MED ORDER — SODIUM CHLORIDE 0.9 % IV BOLUS (SEPSIS)
1000.0000 mL | Freq: Once | INTRAVENOUS | Status: AC
  Administered 2015-04-15: 1000 mL via INTRAVENOUS

## 2015-04-15 MED ORDER — HYDROMORPHONE HCL 1 MG/ML IJ SOLN
1.0000 mg | Freq: Once | INTRAMUSCULAR | Status: AC
  Administered 2015-04-15: 1 mg via INTRAVENOUS
  Filled 2015-04-15: qty 1

## 2015-04-15 MED ORDER — DIPHENHYDRAMINE HCL 50 MG/ML IJ SOLN
25.0000 mg | Freq: Once | INTRAMUSCULAR | Status: AC
  Administered 2015-04-15: 25 mg via INTRAVENOUS
  Filled 2015-04-15: qty 1

## 2015-04-15 NOTE — ED Provider Notes (Signed)
CSN: [id number]     Arrival date & time 04/15/15  1249 History   First MD Initiated Contact with Patient 04/15/15 1302     Chief Complaint  Patient presents with  . Abdominal Pain     (Consider location/radiation/quality/duration/timing/severity/associated sxs/prior Treatment) Patient is a 24 y.o. male presenting with abdominal pain. The history is provided by the patient and the EMS personnel.  Abdominal Pain Associated symptoms: nausea and vomiting   Associated symptoms: no chest pain, no diarrhea, no dysuria and no fever    patient brought in by EMS. Patient with severe abdominal pain started last evening.   Patient swallowed endoscopy camera for evaluation of his intestines on Friday they confirmed that it was working. Patient is concerned because he has not passed it. With the development of abdominal pain starting last evening he's afraid that is gotten stuck. Patient stated he had some blood in his bowel movements no diarrheas had nausea and vomiting no blood in the vomit had severe abdominal pain throughout crampy in nature. 10 out of 10.  Past Medical History  Diagnosis Date  . Allergy   . ADD (attention deficit disorder)   . Family history of anesthesia complication     "grandmother had problems w/breathing" 06/29/2014)  . Complication of anesthesia 06/28/2014    "HR dropped, couldn't get BP up when put to sleep for colonscopy"  . WUJWJXBJ(478.2)     "maybe monthly" (06/29/2014)  . Bipolar disorder   . Oppositional defiant disorder, moderate 11/08/2013  . 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 Appendicitis with abscess 08/08/2014  . Anxiety   . IBS (irritable bowel syndrome)   . Hemorrhoids, internal, with bleeding 03/29/2015   Past Surgical History  Procedure Laterality Date  . 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 Colonoscopy with propofol N/A 06/28/2014    Procedure: COLONOSCOPY WITH PROPOFOL;  Surgeon: Iva Boop, MD;  Location: [facility] ENDOSCOPY;  Service: Endoscopy;   Laterality: N/A;  . Laparoscopic appendectomy N/A 08/08/2014    Procedure: LAPAROSCOPIC APPENDECTOMY;  Surgeon: Manus Rudd, MD;  Location: [facility] OR;  Service: General;  Laterality: N/A;  . Appendectomy     Family History  Problem Relation Age of Onset  . Breast cancer Mother   . Ulcerative colitis Mother   . Colon cancer Neg Hx   . Esophageal cancer Neg Hx   . Rectal cancer Neg Hx   . Stomach cancer Neg Hx    History  Substance Use Topics  . Smoking status: Never Smoker   . Smokeless tobacco: Never Used  . Alcohol Use: No    Review of Systems  Constitutional: Positive for diaphoresis. Negative for fever.  HENT: Negative for congestion.   Eyes: Negative for visual disturbance.  Cardiovascular: Negative for chest pain.  Gastrointestinal: Positive for nausea, vomiting, abdominal pain and blood in stool. Negative for diarrhea and abdominal distention.  Genitourinary: Negative for dysuria.  Musculoskeletal: Negative for back pain.  Skin: Negative for rash.  Neurological: Negative for headaches.  Hematological: Does not bruise/bleed easily.  Psychiatric/Behavioral: Negative for confusion.      Allergies  Morphine and related; Prednisone; and Tramadol  Home Medications   Prior to Admission medications   Medication Sig Start Date End Date Taking? Authorizing Provider  dicyclomine (BENTYL) 20 MG tablet Take 1 tablet (20 mg total) by mouth 4 (four) times daily -  before meals and at bedtime. 04/11/15  Yes Dione Booze, MD  HYDROmorphone (DILAUDID) 2 MG tablet Take  1 tablet (2 mg total) by mouth every 4 (four) hours as needed for severe pain. 04/11/15  Yes Dione Booze, MD  mesalamine (ASACOL) 400 MG EC tablet Take 800 mg by mouth daily.    Yes Historical Provider, MD  glycopyrrolate (ROBINUL) 2 MG tablet Take 1 tablet (2 mg total) by mouth 2 (two) times daily. Patient not taking: Reported on 04/11/2015 04/09/15   Iva Boop, MD   BP 127/62 mmHg  Pulse 83  Temp(Src) 97.8 F  (36.6 C) (Oral)  Resp 17  Ht  (1.88 m)  Wt 170 lb (77.111 kg)  BMI 21.82 kg/m2  SpO2 99% Physical Exam  Constitutional: He is oriented to person, place, and time. He appears well-developed and well-nourished. No distress.  HENT:  Head: Normocephalic and atraumatic.  Mouth/Throat: Oropharynx is clear and moist.  Eyes: Conjunctivae and EOM are normal. Pupils are equal, round, and reactive to light.  Neck: Normal range of motion.  Cardiovascular: Normal rate, regular rhythm and normal heart sounds.   No murmur heard. Pulmonary/Chest: Effort normal and breath sounds normal. No respiratory distress.  Abdominal: Soft. Bowel sounds are normal. He exhibits no distension. There is no tenderness.  Musculoskeletal: Normal range of motion. He exhibits no edema.  Neurological: He is alert and oriented to person, place, and time. No cranial nerve deficit. He exhibits normal muscle tone. Coordination normal.  Skin: Skin is warm. No rash noted.  Nursing note and vitals reviewed.   ED Course  Procedures (including critical care time) Labs Review Labs Reviewed  COMPREHENSIVE METABOLIC PANEL - Abnormal; Notable for the following:    ALT 15 (*)    All other components within normal limits  LIPASE, BLOOD  CBC WITH DIFFERENTIAL/PLATELET   Results for orders placed or performed during the hospital encounter of 04/15/15  Comprehensive metabolic panel  Result Value Ref Range   Sodium 137 135 - 145 mmol/L   Potassium 3.8 3.5 - 5.1 mmol/L   Chloride 104 101 - 111 mmol/L   CO2 25 22 - 32 mmol/L   Glucose, Bld 98 65 - 99 mg/dL   BUN 11 6 - 20 mg/dL   Creatinine, Ser 6.96 0.61 - 1.24 mg/dL   Calcium 8.9 8.9 - 29.5 mg/dL   Total Protein 6.6 6.5 - 8.1 g/dL   Albumin 4.3 3.5 - 5.0 g/dL   AST 20 15 - 41 U/L   ALT 15 (L) 17 - 63 U/L   Alkaline Phosphatase 81 38 - 126 U/L   Total Bilirubin 0.6 0.3 - 1.2 mg/dL   GFR calc non Af Amer >60 >60 mL/min   GFR calc Af Amer >60 >60 mL/min   Anion gap 8  5 - 15  Lipase, blood  Result Value Ref Range   Lipase 36 22 - 51 U/L  CBC with Differential/Platelet  Result Value Ref Range   WBC 5.9 4.0 - 10.5 K/uL   RBC 5.31 4.22 - 5.81 MIL/uL   Hemoglobin 16.1 13.0 - 17.0 g/dL   HCT 28.4 13.2 - 44.0 %   MCV 85.3 78.0 - 100.0 fL   MCH 30.3 26.0 - 34.0 pg   MCHC 35.5 30.0 - 36.0 g/dL   RDW 10.2 72.5 - 36.6 %   Platelets 213 150 - 400 K/uL   Neutrophils Relative % 72 43 - 77 %   Neutro Abs 4.3 1.7 - 7.7 K/uL   Lymphocytes Relative 18 12 - 46 %   Lymphs Abs 1.1 0.7 - 4.0  K/uL   Monocytes Relative 6 3 - 12 %   Monocytes Absolute 0.3 0.1 - 1.0 K/uL   Eosinophils Relative 3 0 - 5 %   Eosinophils Absolute 0.2 0.0 - 0.7 K/uL   Basophils Relative 1 0 - 1 %   Basophils Absolute 0.0 0.0 - 0.1 K/uL     Imaging Review Dg Abd Acute W/chest  04/15/2015   CLINICAL DATA:  abd pain since Friday. Pt c/o n/v no diarrhea. LBM this morning. Blood present. Pt had endoscopy on Friday. Appendix removed in October and has had complications since. Vitals stable.  EXAM: DG ABDOMEN ACUTE W/ 1V CHEST  COMPARISON:  CT 01/18/2015 and earlier studies  FINDINGS: Heart size and mediastinal contours are within normal limits.  Lungs are clear. Mild central peribronchial thickening. No effusion.  Gas throughout multiple nondilated small bowel loops in the mid abdomen. Colon and rectum are decompressed. No significant fluid levels on the erect film.  Stable right pelvic phleboliths.  Regional bones unremarkable.  IMPRESSION: No acute cardiopulmonary disease.  Nonobstructive bowel gas pattern.  No free air.   Electronically Signed   By: Corlis Leak  Hassell M.D.   On: 04/15/2015 14:18     EKG Interpretation None      MDM   Final diagnoses:  Abdominal pain  Intractable vomiting with nausea, vomiting of unspecified type    Patient brought in by EMS. Patient with complaint of severe abdominal pain and cramping in nature. Nausea vomiting no diarrhea. Last bowel movement was this morning  patient says her with some blood present. Patient has panics removed in October with complicating factors with an abscess. Patient is followed by GI medicine. Patient the swallowed the camera to further evaluate his small bowel to try to find out if there is any evidence of Crohn disease. Patient's colonoscopy in the past was negative for evidence of Crohn's or any inflammatory bowel disease. Patient states that he has not passed the camera. This camera was swallowed on Friday they did confirm that it was working. Patient is concerned that it got stuck.  Plain films although doesn't show the lower part of the rectum that shows no evidence of the camera.  We'll undergo CT scan to further evaluate abdominal pain. It is improving some with pain medicine and antinausea medicine. Patient's labs without any significant abnormalities no CT evidence of any anemia or low hemoglobin. There is no leukocytosis or function tests without any abnormalities.  If patient CT of the abdomen is negative he can be discharged home.  CT scan of the abdomen without any acute findings. Is questionable small fluid collection in the submucosal aspect of the rectum possibly could be a small abscess. Doubt that this is responsible for the patient's symptoms. No evidence of the camera. Patient will follow-up with GI medicine this week. Patient has pain medicine at home.  Vanetta MuldersScott Ebony Rickel, MD 04/15/15 [phone]

## 2015-04-15 NOTE — ED Notes (Signed)
[facility]- pt coming from home with abd pain since Friday. Pt c/o n/v no diarrhea. LBM this morning. Blood present. Pt had endoscopy on Friday. Appendix removed in October and has had complications since. Vitals stable.

## 2015-04-15 NOTE — Discharge Instructions (Signed)
Take pain medicine as directed. Plan to follow-up with GI medicine on Tuesday. Do this by phone call. CT scan without any significant findings other than concern for may be a early submucosal abscess or fluid collection off the rectum. This is most likely not responsible for the symptoms that brought you in. But close follow-up of this would be important. No evidence of the camera. Return for any new or worse symptoms.

## 2015-04-23 ENCOUNTER — Encounter: Payer: Self-pay | Admitting: Internal Medicine

## 2015-04-23 ENCOUNTER — Telehealth: Payer: Self-pay

## 2015-04-23 DIAGNOSIS — R197 Diarrhea, unspecified: Secondary | ICD-10-CM

## 2015-04-23 DIAGNOSIS — R1084 Generalized abdominal pain: Secondary | ICD-10-CM

## 2015-04-23 NOTE — Telephone Encounter (Signed)
-----   Message from Iva Booparl E Gessner, MD sent at 04/23/2015  1:30 PM EDT ----- Regarding: Capsule endo Only very slight inflammation of small bowel seen. No good evidence for IBD.  He needs a f/u me should not be urgent. Go ahead and get IBD serology on him as next step please.  Dx is abdominal pain and diarrhea  See if we have any Xifaxan samples around also  Thanks

## 2015-04-23 NOTE — Telephone Encounter (Signed)
He is uninsured so not possible

## 2015-04-23 NOTE — Telephone Encounter (Signed)
Patient notified He is aware I will call him back about xifaxan when Dr., Leone PayorGessner reviews

## 2015-04-23 NOTE — Telephone Encounter (Signed)
NO xifaxan samples.  Do you want to prescribe it?  Left message for patient to call back New lab orders entered

## 2015-04-27 ENCOUNTER — Telehealth: Payer: Self-pay

## 2015-04-27 ENCOUNTER — Other Ambulatory Visit: Payer: Self-pay

## 2015-04-27 DIAGNOSIS — R197 Diarrhea, unspecified: Secondary | ICD-10-CM

## 2015-04-27 DIAGNOSIS — R1084 Generalized abdominal pain: Secondary | ICD-10-CM

## 2015-04-27 MED ORDER — GLYCOPYRROLATE 2 MG PO TABS: 2.0000 mg | ORAL_TABLET | Freq: Two times a day (BID) | ORAL | Status: DC

## 2015-04-27 NOTE — Telephone Encounter (Signed)
Patient has a documented allergy to prednisone- documented reaction is hives.  Please advise Left message for patient to call back He already had follow up scheduled for 05/28/15

## 2015-04-27 NOTE — Telephone Encounter (Signed)
1) glycopyrrolate 2 mg bid #60 1 RF 2) Hydrocortisone suppository 25 mg qhs x 1 week and then as needed #12 no Rf 3) REV me at some point if not setup

## 2015-04-27 NOTE — Telephone Encounter (Signed)
Try preparation H

## 2015-04-27 NOTE — Telephone Encounter (Signed)
Patient walked in after having his blood work drawn to report continued abdominal pain and bright red blood on the tissue.  He showed me a picture on his cell phone of the blood,  Bright red on the tissue.  He states that the bentyl is not helping with the pain and requesting something else for cramps and wants to know what to do about the bleeding.

## 2015-04-30 NOTE — Telephone Encounter (Signed)
Patient notified of prescription info and to pick up prep H

## 2015-04-30 NOTE — Telephone Encounter (Signed)
Left message for patient to call back  

## 2015-05-01 LAB — INFLAMMATORY BOWEL DISEASE-IBD
Saccharomyces cerevisiae, IgA: <20 Units (ref 0.0–24.9)
Saccharomyces cerevisiae, IgG: <20 Units (ref 0.0–24.9)

## 2015-05-02 NOTE — Progress Notes (Signed)
Quick Note:  Studies are negative for IBD ______

## 2015-05-04 ENCOUNTER — Telehealth: Payer: Self-pay | Admitting: Internal Medicine

## 2015-05-04 NOTE — Telephone Encounter (Signed)
I will not Rx narcotics to him

## 2015-05-04 NOTE — Telephone Encounter (Signed)
Any other suggestions Sir?

## 2015-05-04 NOTE — Telephone Encounter (Signed)
Please advise Sir, has appointment with you 05/28/15, thank you.

## 2015-05-04 NOTE — Telephone Encounter (Signed)
Spoke with Casimiro Needle (father) he said Perle is not taking the asacol so I will remove from his med list.  He has plenty of the dicyclomine but has not been taking regularly.  He will take that more regular to see if it helps.  Mahmood is requesting pain rx says the dilaudid  is not helping his pain.

## 2015-05-04 NOTE — Telephone Encounter (Signed)
Spoke with Blake Bradley and told him we could not give him narcotics.  Told him to take the dicyclomine on a more regular basis and hopefully that will help.  He asked about his recent labs, I told him that per Dr Leone Payor they were negative for IBD.  We will see him at his visit 05/28/15.

## 2015-05-04 NOTE — Telephone Encounter (Signed)
Can't help him unfortunately  Cheapest drug is dicyclomine which is on his current list and can be taken 20 mg qac and hs can get $4/$10 at [facility] - can refill that and Rx # 120  No role for asacol which is back on his list - should not be taking

## 2015-05-28 ENCOUNTER — Encounter: Payer: Self-pay | Admitting: Internal Medicine

## 2015-05-28 ENCOUNTER — Ambulatory Visit (INDEPENDENT_AMBULATORY_CARE_PROVIDER_SITE_OTHER): Payer: Self-pay | Admitting: Internal Medicine

## 2015-05-28 VITALS — BP 100/72 | HR 60 | Ht 74.0 in | Wt 166.8 lb

## 2015-05-28 DIAGNOSIS — K589 Irritable bowel syndrome without diarrhea: Secondary | ICD-10-CM

## 2015-05-28 MED ORDER — DICYCLOMINE HCL 20 MG PO TABS: 20.0000 mg | ORAL_TABLET | Freq: Three times a day (TID) | ORAL | Status: DC

## 2015-05-28 NOTE — Progress Notes (Signed)
   Subjective:    Patient ID: Blake Bradley, male    DOB: Jan 14, 1991, 25 y.o.   MRN: [id number] Cc: f/u BS HPI Doing better overall but still has some days with urgent frequent stools several times in the day. Mostly ok tjough and has stopped going to ED. Capusle endoscopy small bowel ok.  Using dicyclomine with help.  Medications, allergies, past medical history, past surgical history, family history and social history are reviewed and updated in the EMR.  Review of Systems As above    Objective:   Physical Exam BP 100/72 mmHg  Pulse 60  Ht  (1.88 m)  Wt 166 lb 12.8 oz (75.66 kg)  BMI 21.41 kg/m2 Young wm NAD    Assessment & Plan:  IBS (irritable bowel syndrome)  Continue dicyclomine Add qhs loperamide 1-2 titrate for effect See me prn

## 2015-05-28 NOTE — Patient Instructions (Addendum)
Take  1-2 Imodium daily at bedtime.   Call us back if that doesn't help.  Follow up with Dr. Leone Payor as needed.  We have sent the following medications to your pharmacy for you to pick up at your convenience: Dicyclomine    I appreciate the opportunity to care for you. Stan Head, MD, [facility]

## 2015-06-04 ENCOUNTER — Telehealth: Payer: Self-pay | Admitting: Internal Medicine

## 2015-06-04 NOTE — Telephone Encounter (Signed)
Patient's father called to report that the patient is still passing blood.  He reports that he is taking the dicyclomine BID and is still having terrible pain.  He reports that the stools are solid, so he is not taking loperamide.  Please advise

## 2015-06-04 NOTE — Telephone Encounter (Signed)
Have him try prep H then

## 2015-06-04 NOTE — Telephone Encounter (Signed)
Left message for patient to call back  

## 2015-06-04 NOTE — Telephone Encounter (Signed)
Patients father notified.

## 2015-06-04 NOTE — Telephone Encounter (Signed)
Dr. Leone Payor he has a documented allergy to prednisone.  Please advise

## 2015-06-04 NOTE — Telephone Encounter (Signed)
Hydrocortisone suppository 25 mg hx x 7 nights # 7 no refill

## 2015-06-20 ENCOUNTER — Telehealth: Payer: Self-pay | Admitting: Internal Medicine

## 2015-06-21 NOTE — Telephone Encounter (Signed)
Patient wanted to go to a GI at [facility].  I notified his father no GI there.  He is asked to call back and let us know where he would like to be referred.

## 2015-06-21 NOTE — Telephone Encounter (Signed)
Left message for patient to call back  

## 2015-06-24 ENCOUNTER — Emergency Department (HOSPITAL_COMMUNITY)
Admission: EM | Admit: 2015-06-24 | Discharge: 2015-06-24 | Disposition: A | Discharge status: Home / Self Care | Payer: Self-pay | Attending: Emergency Medicine | Admitting: Emergency Medicine

## 2015-06-24 ENCOUNTER — Emergency Department (HOSPITAL_COMMUNITY): Admission: EM | Admit: 2015-06-24 | Discharge: 2015-06-24 | Discharge status: Against Medical Advice | Payer: Self-pay

## 2015-06-24 ENCOUNTER — Encounter (HOSPITAL_COMMUNITY): Payer: Self-pay | Admitting: *Deleted

## 2015-06-24 DIAGNOSIS — R1031 Right lower quadrant pain: Secondary | ICD-10-CM | POA: Insufficient documentation

## 2015-06-24 DIAGNOSIS — Z8719 Personal history of other diseases of the digestive system: Secondary | ICD-10-CM | POA: Insufficient documentation

## 2015-06-24 DIAGNOSIS — R197 Diarrhea, unspecified: Secondary | ICD-10-CM | POA: Insufficient documentation

## 2015-06-24 DIAGNOSIS — Z872 Personal history of diseases of the skin and subcutaneous tissue: Secondary | ICD-10-CM | POA: Insufficient documentation

## 2015-06-24 DIAGNOSIS — Z79899 Other long term (current) drug therapy: Secondary | ICD-10-CM | POA: Insufficient documentation

## 2015-06-24 DIAGNOSIS — Z8659 Personal history of other mental and behavioral disorders: Secondary | ICD-10-CM | POA: Insufficient documentation

## 2015-06-24 LAB — COMPREHENSIVE METABOLIC PANEL
ALK PHOS: 79 U/L (ref 38–126)
ALT: 17 U/L (ref 17–63)
ANION GAP: 8 (ref 5–15)
AST: 25 U/L (ref 15–41)
Albumin: 4.4 g/dL (ref 3.5–5.0)
BILIRUBIN TOTAL: 0.9 mg/dL (ref 0.3–1.2)
BUN: 14 mg/dL (ref 6–20)
CALCIUM: 9.2 mg/dL (ref 8.9–10.3)
CO2: 28 mmol/L (ref 22–32)
Chloride: 105 mmol/L (ref 101–111)
Creatinine, Ser: 1.25 mg/dL — ABNORMAL HIGH (ref 0.61–1.24)
GLUCOSE: 92 mg/dL (ref 65–99)
Potassium: 4 mmol/L (ref 3.5–5.1)
Sodium: 141 mmol/L (ref 135–145)
TOTAL PROTEIN: 6.8 g/dL (ref 6.5–8.1)

## 2015-06-24 LAB — CBC WITH DIFFERENTIAL/PLATELET
BASOS ABS: 0.1 10*3/uL (ref 0.0–0.1)
BASOS PCT: 1 % (ref 0–1)
Eosinophils Absolute: 0.4 10*3/uL (ref 0.0–0.7)
Eosinophils Relative: 6 % — ABNORMAL HIGH (ref 0–5)
HEMATOCRIT: 44.4 % (ref 39.0–52.0)
Hemoglobin: 15.5 g/dL (ref 13.0–17.0)
Lymphocytes Relative: 32 % (ref 12–46)
Lymphs Abs: 2.2 10*3/uL (ref 0.7–4.0)
MCH: 30.5 pg (ref 26.0–34.0)
MCHC: 34.9 g/dL (ref 30.0–36.0)
MCV: 87.2 fL (ref 78.0–100.0)
MONO ABS: 0.5 10*3/uL (ref 0.1–1.0)
Monocytes Relative: 7 % (ref 3–12)
NEUTROS ABS: 3.8 10*3/uL (ref 1.7–7.7)
NEUTROS PCT: 54 % (ref 43–77)
Platelets: 241 10*3/uL (ref 150–400)
RBC: 5.09 MIL/uL (ref 4.22–5.81)
RDW: 12.8 % (ref 11.5–15.5)
WBC: 6.9 10*3/uL (ref 4.0–10.5)

## 2015-06-24 LAB — LIPASE, BLOOD: Lipase: 46 U/L (ref 22–51)

## 2015-06-24 MED ORDER — SODIUM CHLORIDE 0.9 % IV BOLUS (SEPSIS): 1000.0000 mL | Freq: Once | INTRAVENOUS | Status: DC

## 2015-06-24 MED ORDER — HYDROMORPHONE HCL 1 MG/ML IJ SOLN
1.0000 mg | Freq: Once | INTRAMUSCULAR | Status: DC
Start: 2015-06-24 — End: 2015-06-24
  Filled 2015-06-24: qty 1

## 2015-06-24 MED ORDER — PANTOPRAZOLE SODIUM 20 MG PO TBEC: 20.0000 mg | DELAYED_RELEASE_TABLET | Freq: Two times a day (BID) | ORAL | Status: DC

## 2015-06-24 MED ORDER — ONDANSETRON HCL 4 MG/2ML IJ SOLN
4.0000 mg | Freq: Once | INTRAMUSCULAR | Status: DC
  Filled 2015-06-24: qty 2

## 2015-06-24 NOTE — ED Notes (Signed)
The pt is c/o abd pain intermittently  Since sept 2015,  He has had abd pain and some diarrhes.  He was having a bm tonight diarrhea and he felt faint like he was going to pass out.  More abd pain since then

## 2015-06-24 NOTE — ED Notes (Signed)
Pt left per registration 

## 2015-06-24 NOTE — ED Notes (Signed)
The pt is also c/o chest pain 

## 2015-06-24 NOTE — ED Notes (Signed)
Patient left ED without discharge papers/prescription prior to being discharged.

## 2015-06-24 NOTE — Discharge Instructions (Signed)
Protonix as prescribed.  Follow-up with your primary Dr. to discuss referrals to other GI specialists.   Abdominal Pain Many things can cause abdominal pain. Usually, abdominal pain is not caused by a disease and will improve without treatment. It can often be observed and treated at home. Your health care provider will do a physical exam and possibly order blood tests and X-rays to help determine the seriousness of your pain. However, in many cases, more time must pass before a clear cause of the pain can be found. Before that point, your health care provider may not know if you need more testing or further treatment. HOME CARE INSTRUCTIONS  Monitor your abdominal pain for any changes. The following actions may help to alleviate any discomfort you are experiencing:  Only take over-the-counter or prescription medicines as directed by your health care provider.  Do not take laxatives unless directed to do so by your health care provider.  Try a clear liquid diet (broth, tea, or water) as directed by your health care provider. Slowly move to a bland diet as tolerated. SEEK MEDICAL CARE IF:  You have unexplained abdominal pain.  You have abdominal pain associated with nausea or diarrhea.  You have pain when you urinate or have a bowel movement.  You experience abdominal pain that wakes you in the night.  You have abdominal pain that is worsened or improved by eating food.  You have abdominal pain that is worsened with eating fatty foods.  You have a fever. SEEK IMMEDIATE MEDICAL CARE IF:   Your pain does not go away within 2 hours.  You keep throwing up (vomiting).  Your pain is felt only in portions of the abdomen, such as the right side or the left lower portion of the abdomen.  You pass bloody or black tarry stools. MAKE SURE YOU:  Understand these instructions.   Will watch your condition.   Will get help right away if you are not doing well or get worse.  Document  Released: 07/09/2005 Document Revised: 10/04/2013 Document Reviewed: 06/08/2013 [facility] Patient Information 2015 [place], [place]. This information is not intended to replace advice given to you by your health care provider. Make sure you discuss any questions you have with your health care provider.

## 2015-06-24 NOTE — ED Notes (Signed)
Patient refused IV and IV meds reporting that he did not want "pain medication that is just going to wear off in a few hours." Requested "high power stomach medicine". Notified Dr. Patric Dykes. MD aware.

## 2015-06-24 NOTE — ED Provider Notes (Signed)
CSN: [id number]     Arrival date & time 06/24/15  0005 History   This chart was scribed for Geoffery Lyons, MD by Evon Slack, ED Scribe. This patient was seen in room A05C/A05C and the patient's care was started at 3:32 AM.      Chief Complaint  Patient presents with  . Abdominal Pain   Patient is a 24 y.o. male presenting with abdominal pain. The history is provided by the patient. No language interpreter was used.  Abdominal Pain Associated symptoms: diarrhea    HPI Comments: Merton Rodrigues is a 24 y.o. male with PMHx who presents to the Emergency Department complaining of intermittent low abdominal pain onset 1 year prior. Pt states that he has been having abdominal pain since having the appendectomy. Pt states that the pain is radiating to his chest. Pt states that tonight he has has had loose watery stools. He states that during th BM he became really sweaty to the point he thought he was going to pass out. Pt states that he has tried ibuprofen with no relief. Pt reports Hx of ruptured appendix and appendectomy.   Past Medical History  Diagnosis Date  . Allergy   . ADD (attention deficit disorder)   . Family history of anesthesia complication     "grandmother had problems w/breathing" 06/29/2014)  . Complication of anesthesia 06/28/2014    "HR dropped, couldn't get BP up when put to sleep for colonscopy"  . MVHQIONG(295.2)     "maybe monthly" (06/29/2014)  . Bipolar disorder   . Oppositional defiant disorder, moderate 11/08/2013  . 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 Appendicitis with abscess 08/08/2014  . Anxiety   . IBS (irritable bowel syndrome)   . Hemorrhoids, internal, with bleeding 03/29/2015   Past Surgical History  Procedure Laterality Date  . 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 Colonoscopy with propofol N/A 06/28/2014    Procedure: COLONOSCOPY WITH PROPOFOL;  Surgeon: Iva Boop, MD;  [facility] ENDOSCOPY;  Service: Endoscopy;  Laterality: N/A;  .  Laparoscopic appendectomy N/A 08/08/2014    Procedure: LAPAROSCOPIC APPENDECTOMY;  Surgeon: Manus Rudd, MD;  Location: [facility] OR;  Service: General;  Laterality: N/A;  . Appendectomy    . Capsule endoscopy  04/13/2015   Family History  Problem Relation Age of Onset  . Breast cancer Mother   . Ulcerative colitis Mother   . Colon cancer Neg Hx   . Esophageal cancer Neg Hx   . Rectal cancer Neg Hx   . Stomach cancer Neg Hx    Social History  Substance Use Topics  . Smoking status: Never Smoker   . Smokeless tobacco: Never Used  . Alcohol Use: No    Review of Systems  Gastrointestinal: Positive for abdominal pain and diarrhea.  All other systems reviewed and are negative.    Allergies  Morphine and related; Prednisone; and Tramadol  Home Medications   Prior to Admission medications   Medication Sig Start Date End Date Taking? Authorizing Provider  dicyclomine (BENTYL) 20 MG tablet Take 1 tablet (20 mg total) by mouth 4 (four) times daily -  before meals and at bedtime. 05/28/15  Yes Iva Boop, MD  ranitidine (ZANTAC) 150 MG tablet Take 150 mg by mouth 2 (two) times daily.   Yes Historical Provider, MD   BP 133/74 mmHg  Pulse 63  Temp(Src) 98.5 F (36.9 C) (Oral)  Resp 20  Ht 6\' 1"  (1.854 m)  Wt 175  lb (79.379 kg)  BMI 23.09 kg/m2  SpO2 100%   Physical Exam  Constitutional: He is oriented to person, place, and time. He appears well-developed and well-nourished. No distress.  HENT:  Head: Normocephalic and atraumatic.  Eyes: Conjunctivae and EOM are normal.  Neck: Neck supple. No tracheal deviation present.  Cardiovascular: Normal rate, regular rhythm and normal heart sounds.   No murmur heard. Pulmonary/Chest: Effort normal. No respiratory distress. He has no wheezes. He has no rales.  Abdominal: There is tenderness. There is no rebound and no guarding.  TTP in the suprapubic region and RLQ.   Musculoskeletal: Normal range of motion.  Neurological: He is alert  and oriented to person, place, and time.  Skin: Skin is warm and dry.  Psychiatric: He has a normal mood and affect. His behavior is normal.  Nursing note and vitals reviewed.   ED Course  Procedures (including critical care time) DIAGNOSTIC STUDIES: Oxygen Saturation is 100% on RA, normal by my interpretation.    COORDINATION OF CARE: 3:39 AM-Discussed treatment plan with pt at bedside and pt agreed to plan.     Labs Review Labs Reviewed  COMPREHENSIVE METABOLIC PANEL - Abnormal; Notable for the following:    Creatinine, Ser 1.25 (*)    All other components within normal limits  CBC WITH DIFFERENTIAL/PLATELET - Abnormal; Notable for the following:    Eosinophils Relative 6 (*)    All other components within normal limits  LIPASE, BLOOD  URINALYSIS, ROUTINE W REFLEX MICROSCOPIC (NOT AT [facility])    Imaging Review No results found.    EKG Interpretation None      MDM   Final diagnoses:  None      Patient with intermittent episodes of abdominal pain for many months since being diagnosed with perforated appendicitis. He has been seen by multiple physicians including general surgery, gastroenterology, and his primary Dr., however no cause has been found for these episodes. His laboratory studies today are all reassuring. Upon reviewing his chart, he has had multiple CT scans over this period of time. I expressed concern about performing another, however did offer to administer fluids and pain medication which she refused. I return to the room after he refused his medications and he stated that he wanted something to fix the problem, not just cover it up. He was requesting some sort of an acid as this has helped him in the past. I informed him that I would write him proton ask and he initially agreed.  Upon reading the nurse's note, it appears as though the patient left from the emergency department prior to receiving his discharge paperwork or prescription.   I personally  performed the services described in this documentation, which was scribed in my presence. The recorded information has been reviewed and is accurate.      Geoffery Lyons, MD 06/24/15 [phone]

## 2015-10-05 ENCOUNTER — Emergency Department (HOSPITAL_COMMUNITY)
Admission: EM | Admit: 2015-10-05 | Discharge: 2015-10-05 | Disposition: A | Discharge status: Home / Self Care | Payer: Self-pay | Attending: Emergency Medicine | Admitting: Emergency Medicine

## 2015-10-05 ENCOUNTER — Emergency Department (HOSPITAL_COMMUNITY): Payer: 59

## 2015-10-05 ENCOUNTER — Encounter (HOSPITAL_COMMUNITY): Payer: Self-pay | Admitting: *Deleted

## 2015-10-05 DIAGNOSIS — Z872 Personal history of diseases of the skin and subcutaneous tissue: Secondary | ICD-10-CM | POA: Insufficient documentation

## 2015-10-05 DIAGNOSIS — K921 Melena: Secondary | ICD-10-CM | POA: Insufficient documentation

## 2015-10-05 DIAGNOSIS — R110 Nausea: Secondary | ICD-10-CM | POA: Insufficient documentation

## 2015-10-05 DIAGNOSIS — Z9049 Acquired absence of other specified parts of digestive tract: Secondary | ICD-10-CM | POA: Insufficient documentation

## 2015-10-05 DIAGNOSIS — R61 Generalized hyperhidrosis: Secondary | ICD-10-CM | POA: Insufficient documentation

## 2015-10-05 DIAGNOSIS — Z8719 Personal history of other diseases of the digestive system: Secondary | ICD-10-CM | POA: Insufficient documentation

## 2015-10-05 DIAGNOSIS — R109 Unspecified abdominal pain: Secondary | ICD-10-CM | POA: Insufficient documentation

## 2015-10-05 DIAGNOSIS — Z8659 Personal history of other mental and behavioral disorders: Secondary | ICD-10-CM | POA: Insufficient documentation

## 2015-10-05 DIAGNOSIS — R197 Diarrhea, unspecified: Secondary | ICD-10-CM | POA: Insufficient documentation

## 2015-10-05 LAB — COMPREHENSIVE METABOLIC PANEL
ALBUMIN: 3.8 g/dL (ref 3.5–5.0)
ALT: 14 U/L — ABNORMAL LOW (ref 17–63)
ANION GAP: 6 (ref 5–15)
AST: 20 U/L (ref 15–41)
Alkaline Phosphatase: 76 U/L (ref 38–126)
BUN: 14 mg/dL (ref 6–20)
CO2: 28 mmol/L (ref 22–32)
Calcium: 9.2 mg/dL (ref 8.9–10.3)
Chloride: 106 mmol/L (ref 101–111)
Creatinine, Ser: 1.03 mg/dL (ref 0.61–1.24)
GFR calc Af Amer: >60 mL/min (ref >60)
GFR calc non Af Amer: >60 mL/min (ref >60)
GLUCOSE: 101 mg/dL — AB (ref 65–99)
POTASSIUM: 4.2 mmol/L (ref 3.5–5.1)
SODIUM: 140 mmol/L (ref 135–145)
Total Bilirubin: 0.4 mg/dL (ref 0.3–1.2)
Total Protein: 6.2 g/dL — ABNORMAL LOW (ref 6.5–8.1)

## 2015-10-05 LAB — CBC
HEMATOCRIT: 44.1 % (ref 39.0–52.0)
HEMOGLOBIN: 15 g/dL (ref 13.0–17.0)
MCH: 30.5 pg (ref 26.0–34.0)
MCHC: 34 g/dL (ref 30.0–36.0)
MCV: 89.6 fL (ref 78.0–100.0)
Platelets: 207 10*3/uL (ref 150–400)
RBC: 4.92 MIL/uL (ref 4.22–5.81)
RDW: 12.6 % (ref 11.5–15.5)
WBC: 5.5 10*3/uL (ref 4.0–10.5)

## 2015-10-05 LAB — URINALYSIS, ROUTINE W REFLEX MICROSCOPIC
BILIRUBIN URINE: NEGATIVE
Glucose, UA: NEGATIVE mg/dL
HGB URINE DIPSTICK: NEGATIVE
Ketones, ur: NEGATIVE mg/dL
Leukocytes, UA: NEGATIVE
Nitrite: NEGATIVE
PH: 7.5 (ref 5.0–8.0)
Protein, ur: NEGATIVE mg/dL
SPECIFIC GRAVITY, URINE: 1.024 (ref 1.005–1.030)

## 2015-10-05 LAB — SEDIMENTATION RATE: Sed Rate: 3 mm/hr (ref 0–16)

## 2015-10-05 LAB — LIPASE, BLOOD: Lipase: 44 U/L (ref 11–51)

## 2015-10-05 LAB — C-REACTIVE PROTEIN: CRP: 0.8 mg/dL (ref <1.0)

## 2015-10-05 MED ORDER — SODIUM CHLORIDE 0.9 % IV BOLUS (SEPSIS)
1000.0000 mL | Freq: Once | INTRAVENOUS | Status: AC
  Administered 2015-10-05: 1000 mL via INTRAVENOUS

## 2015-10-05 MED ORDER — ACETAMINOPHEN 500 MG PO TABS
1000.0000 mg | ORAL_TABLET | Freq: Once | ORAL | Status: AC
  Administered 2015-10-05: 1000 mg via ORAL
  Filled 2015-10-05: qty 2

## 2015-10-05 NOTE — ED Notes (Signed)
Pt states that he has been evaluated before for the same about 5-6 months ago, has had dark blood in stool for about one week, pt has has colonoscopy and told he needed to see another doctor because they didn't know the problem..Marland Kitchen

## 2015-10-05 NOTE — Discharge Instructions (Signed)
Please set up GI follow-up with Kirstie Mirza, or [facility] as previously recommended by your GI doctor. Please return without fail for worsening symptoms, including worsening pain, worsening bleeding, passing out, difficulty breathing or severe fatigue with daily activities, or any other symptoms concerning to you.   Diarrhea Diarrhea is frequent loose and watery bowel movements. It can cause you to feel weak and dehydrated. Dehydration can cause you to become tired and thirsty, have a dry mouth, and have decreased urination that often is dark yellow. Diarrhea is a sign of another problem, most often an infection that will not last long. In most cases, diarrhea typically lasts 2-3 days. However, it can last longer if it is a sign of something more serious. It is important to treat your diarrhea as directed by your caregiver to lessen or prevent future episodes of diarrhea. CAUSES  Some common causes include:  Gastrointestinal infections caused by viruses, bacteria, or parasites.  Food poisoning or food allergies.  Certain medicines, such as antibiotics, chemotherapy, and laxatives.  Artificial sweeteners and fructose.  Digestive disorders. HOME CARE INSTRUCTIONS  Ensure adequate fluid intake (hydration): Have 1 cup (8 oz) of fluid for each diarrhea episode. Avoid fluids that contain simple sugars or sports drinks, fruit juices, whole milk products, and sodas. Your urine should be clear or pale yellow if you are drinking enough fluids. Hydrate with an oral rehydration solution that you can purchase at pharmacies, retail stores, and online. You can prepare an oral rehydration solution at home by mixing the following ingredients together:   - tsp table salt.   tsp baking soda.   tsp salt substitute containing potassium chloride.  1  tablespoons sugar.  1 L (34 oz) of water.  Certain foods and beverages may increase the speed at which food moves through the gastrointestinal (GI) tract.  These foods and beverages should be avoided and include:  Caffeinated and alcoholic beverages.  High-fiber foods, such as raw fruits and vegetables, nuts, seeds, and whole grain breads and cereals.  Foods and beverages sweetened with sugar alcohols, such as xylitol, sorbitol, and mannitol.  Some foods may be well tolerated and may help thicken stool including:  Starchy foods, such as rice, toast, pasta, low-sugar cereal, oatmeal, grits, baked potatoes, crackers, and bagels.  Bananas.  Applesauce.  Add probiotic-rich foods to help increase healthy bacteria in the GI tract, such as yogurt and fermented milk products.  Wash your hands well after each diarrhea episode.  Only take over-the-counter or prescription medicines as directed by your caregiver.  Take a warm bath to relieve any burning or pain from frequent diarrhea episodes. SEEK IMMEDIATE MEDICAL CARE IF:   You are unable to keep fluids down.  You have persistent vomiting.  You have blood in your stool, or your stools are black and tarry.  You do not urinate in 6-8 hours, or there is only a small amount of very dark urine.  You have abdominal pain that increases or localizes.  You have weakness, dizziness, confusion, or light-headedness.  You have a severe headache.  Your diarrhea gets worse or does not get better.  You have a fever or persistent symptoms for more than 2-3 days.  You have a fever and your symptoms suddenly get worse. MAKE SURE YOU:   Understand these instructions.  Will watch your condition.  Will get help right away if you are not doing well or get worse.   This information is not intended to replace advice given to  you by your health care provider. Make sure you discuss any questions you have with your health care provider.   Document Released: 09/19/2002 Document Revised: 10/20/2014 Document Reviewed: 06/06/2012 Elsevier Interactive Patient Education [employer]2016 [employer] Inc.

## 2015-10-05 NOTE — ED Notes (Signed)
The pt is c/o abd pain for one week  He has also had diarrhea dark colored he reports dark blood in stools.  He has ibs

## 2015-10-05 NOTE — ED Provider Notes (Signed)
CSN: [id number]     Arrival date & time 10/05/15  0105 History  By signing my name below, I, Octavia Heir, attest that this documentation has been prepared under the direction and in the presence of Lavera Guise, MD. Electronically Signed: Octavia Heir, ED Scribe. 10/05/2015. 2:43 AM.    Chief Complaint  Patient presents with  . Abdominal Pain      The history is provided by the patient. No language interpreter was used.   HPI Comments: Blake Bradley is a 24 y.o. male who has a hx of IBS and appendectomy who presents to the Emergency Department complaining of intermittent, crampy abdominal pain with associated bloody diarrhea onset one week ago. He has been having associated night sweats and nausea. States that he has had the same symptoms for quite some time now, but symptoms typically last for 1-2 days before going away. States that he has had extensive workup with his gastroenterology including blood work testing him for infectious or other inflammatory causes. He has had capsule endoscopy and upper and lower endoscopy with his gastroenterologist. Biopsy shows evidence of mild irritable bowel syndrome, and no evidence of inflammatory bowel disease. His doctor did not find etiology for his GI bleeding with repeat endoscopies, and had plans of referring him to gastroenterology at [facility]. He reports his bowel movements are not bowel movements anymore, and sometimes he passes frank blood. No hematemesis. He has about 1-4 episodes a day of bloody diarrhea. Pt denies weight loss, and vomiting. No travel history or recent antibiotics. No sick contacts or fever.   Past Medical History  Diagnosis Date  . Allergy   . ADD (attention deficit disorder)   . Family history of anesthesia complication     "grandmother had problems w/breathing" 06/29/2014)  . Complication of anesthesia 06/28/2014    "HR dropped, couldn't get BP up when put to sleep for colonscopy"  . AOZHYQMV(784.6)     "maybe  monthly" (06/29/2014)  . Bipolar disorder (HCC)   . Oppositional defiant disorder, moderate 11/08/2013  . 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 Appendicitis with abscess 08/08/2014  . Anxiety   . IBS (irritable bowel syndrome)   . Hemorrhoids, internal, with bleeding 03/29/2015   Past Surgical History  Procedure Laterality Date  . 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 Colonoscopy with propofol N/A 06/28/2014    Procedure: COLONOSCOPY WITH PROPOFOL;  Surgeon: Iva Boop, MD;  Location: [facility] ENDOSCOPY;  Service: Endoscopy;  Laterality: N/A;  . Laparoscopic appendectomy N/A 08/08/2014    Procedure: LAPAROSCOPIC APPENDECTOMY;  Surgeon: Manus Rudd, MD;  Location: [facility] OR;  Service: General;  Laterality: N/A;  . Appendectomy    . Capsule endoscopy  04/13/2015   Family History  Problem Relation Age of Onset  . Breast cancer Mother   . Ulcerative colitis Mother   . Colon cancer Neg Hx   . Esophageal cancer Neg Hx   . Rectal cancer Neg Hx   . Stomach cancer Neg Hx    Social History  Substance Use Topics  . Smoking status: Never Smoker   . Smokeless tobacco: Never Used  . Alcohol Use: No    Review of Systems  Gastrointestinal: Positive for abdominal pain, diarrhea and blood in stool. Negative for vomiting.  All other systems reviewed and are negative.     Allergies  Morphine and related; Prednisone; and Tramadol  Home Medications   Prior to Admission medications   Not on File  Triage vitals: BP 137/79 mmHg  Pulse 109  Temp(Src) 98.6 F (37 C)  Resp 20  Ht 6\' 2"  (1.88 m)  Wt 175 lb (79.379 kg)  BMI 22.46 kg/m2  SpO2 99% Physical Exam Physical Exam  Nursing note and vitals reviewed. Constitutional: Well developed, well nourished, non-toxic, and in no acute distress Head: Normocephalic and atraumatic.  Mouth/Throat: Oropharynx is clear and moist.  Neck: Normal range of motion. Neck supple.  Cardiovascular: Normal rate and regular rhythm.   Pulmonary/Chest:  Effort normal and breath sounds normal.  Abdominal: Soft. There is no tenderness. There is no rebound and no guarding.  Musculoskeletal: Normal range of motion.  Neurological: Alert, no facial droop, fluent speech, moves all extremities symmetrically Skin: Skin is warm and dry.  Psychiatric: Cooperative  ED Course  Procedures  DIAGNOSTIC STUDIES: Oxygen Saturation is 99% on RA, normal by my interpretation.  COORDINATION OF CARE:  2:43 AM Discussed treatment plan with pt at bedside and pt agreed to plan.  Labs Review Labs Reviewed  COMPREHENSIVE METABOLIC PANEL - Abnormal; Notable for the following:    Glucose, Bld 101 (*)    Total Protein 6.2 (*)    ALT 14 (*)    All other components within normal limits  URINALYSIS, ROUTINE W REFLEX MICROSCOPIC (NOT AT [facility]) - Abnormal; Notable for the following:    APPearance CLOUDY (*)    All other components within normal limits  LIPASE, BLOOD  CBC  SEDIMENTATION RATE  C-REACTIVE PROTEIN    Imaging Review Dg Abd 2 Views  10/05/2015  CLINICAL DATA:  Abdominal pain EXAM: ABDOMEN - 2 VIEW COMPARISON:  04/15/2015 FINDINGS: The bowel gas pattern is normal. There is no evidence of free air. No radio-opaque calculi or other significant radiographic abnormality is seen. IMPRESSION: Negative. Electronically Signed   By: Marnee SpringJonathon  Watts M.D.   On: 10/05/2015 06:02   I have personally reviewed and evaluated these images and lab results as part of my medical decision-making.   EKG Interpretation None      MDM   Final diagnoses:  Abdominal pain  Bloody diarrhea   In short this is a 24 year old male with history of IBS and appendectomy who presents with intermittent abdominal cramping and bloody diarrhea worsening over the past week. He is nontoxic and in no acute distress on presentation. He is hemodynamically stable. Appears well-hydrated on exam. Has a soft and nontender abdomen initially on my examination. Rectal exam reveals no blood or  stool within his rectum. No further episodes of bloody diarrhea while here in the emergency department. He has no significant leukocytosis and normal inflammatory markers. He has a normal hemoglobin, unchanged from his baseline. In the setting of ongoing bloody diarrhea for the past 7 days, I would expect to see significant decline in hemoglobin if large-volume bleeding. No major I metabolic derangements are noted. Patient did briefly experience reported abdominal pain over his right hemiabdomen, and complaints of inability to pass gas. An acute abdominal series is performed showing normal bowel gas pattern, and no evidence of free air. The symptoms subsequently resolved after receiving tylenol. He has had extensive workup for these symptoms over the past year, and I currently do not believe that he requires any acute CT imaging or endoscopy currently. I have given [facility] as previously recommended by his gastroenterologist. Strict return and follow-up instructions are reviewed. He expressed understanding of all discharge instructions for comfortable to plan of care.  I personally performed the services described in this documentation, which was scribed in my presence. The recorded information has been reviewed and is accurate.   Lavera Guise, MD 10/05/15 [phone]

## 2016-03-01 IMAGING — CR DG SMALL BOWEL
4 series · 4 of 4 positions shown · non-contrast
Comparison: 06/19/2014 CT

CLINICAL DATA: 22-year-old male with Crohn's disease, abdominal and
pelvic pain, nausea vomiting and diarrhea.

EXAM:
SMALL BOWEL SERIES
TECHNIQUE: Following ingestion of thin barium, serial small bowel images were
obtained.
FLUOROSCOPY TIME:  0 min 0 seconds

[t abd/barium *]
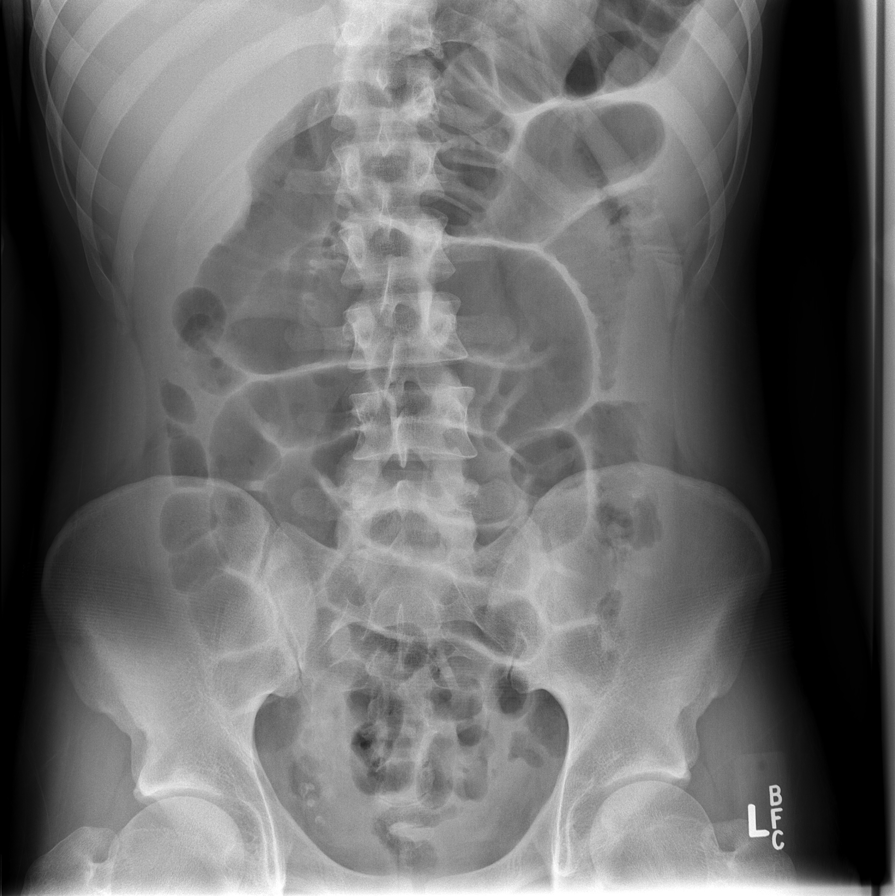

[t abdomen supine (1 of 3)]
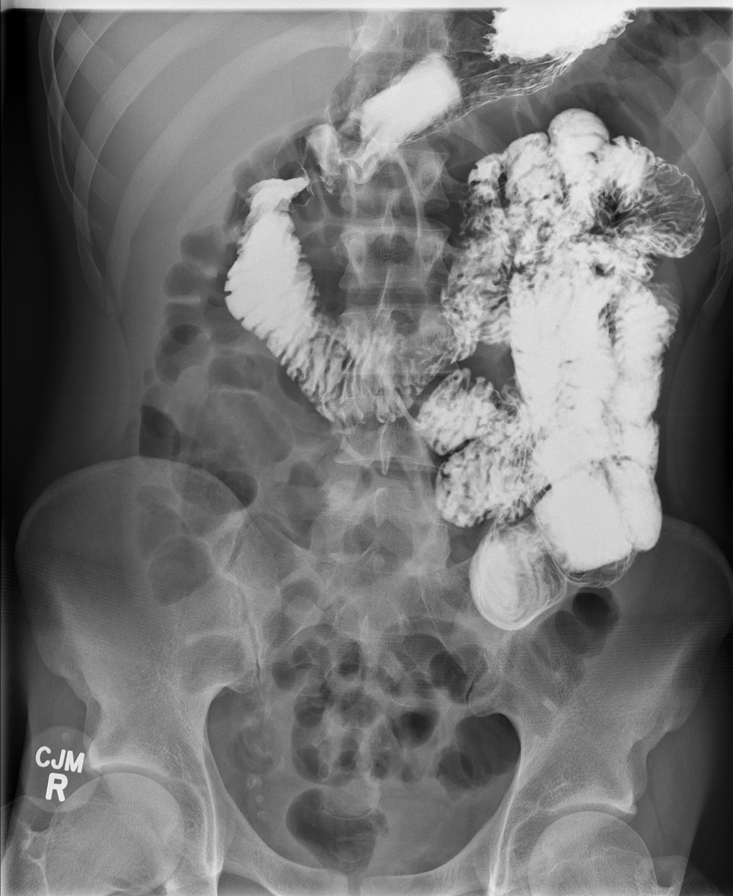

[t abdomen supine (2 of 3)]
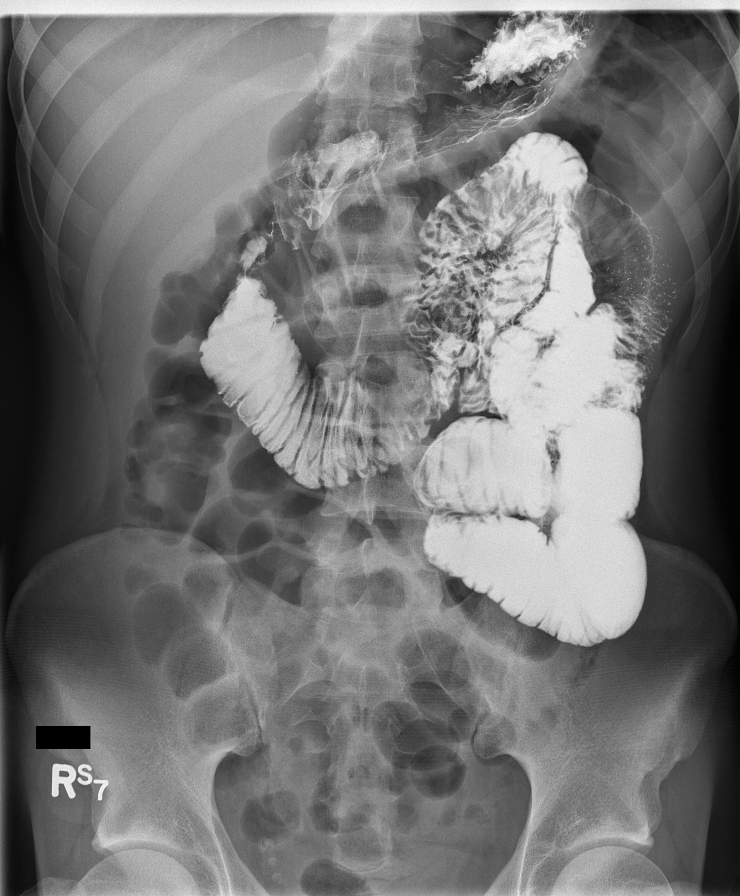

[t abdomen supine (3 of 3)]
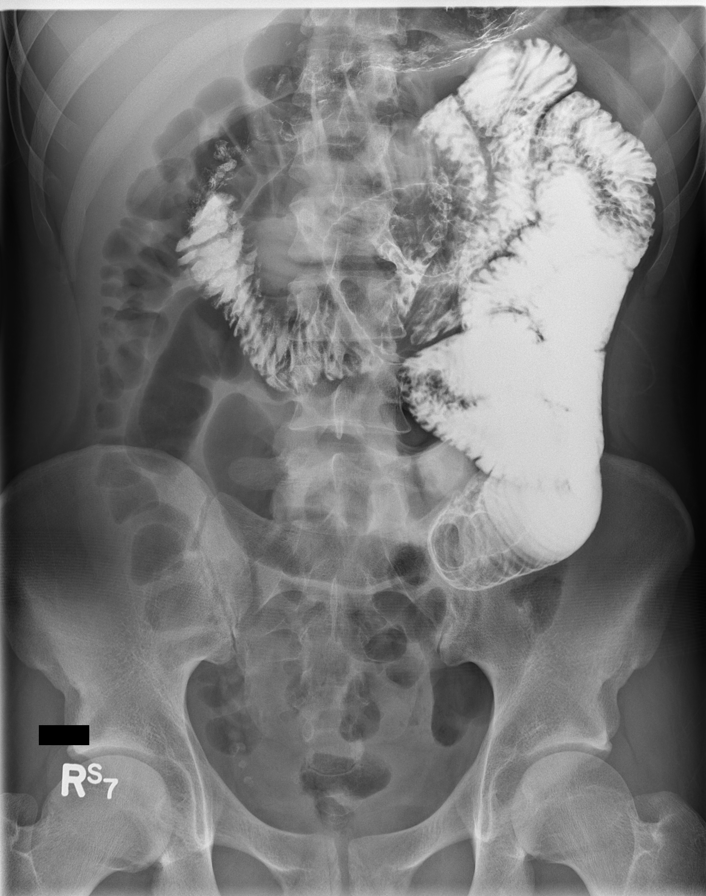

[4 of 4 positions shown; findings below may reference images not displayed]

FINDINGS: A scout film of the abdomen demonstrates distended small bowel
loops. Gas in the colon and a small amount a gas in the rectum
noted.

After administration of oral contrast, contrast flows freely into
the proximal small bowel. Following this, very slow progression of
oral contrast is noted with distended mid small bowel loops noted.
Contrast reaches the colon by 5 hr. Contrast within the distal small
bowel is very dilute and therefore spot compression view was not
performed.
IMPRESSION: Distended proximal small bowel loops with very slow progression of
contrast into the colon. This likely represents a partial small
bowel obstruction. Spot compression views were not obtained given
very diluted contrast within the distal small bowel.

## 2016-06-18 ENCOUNTER — Ambulatory Visit: Payer: Self-pay | Admitting: Internal Medicine

## 2016-06-18 ENCOUNTER — Encounter: Payer: Self-pay | Admitting: Internal Medicine

## 2016-06-18 VITALS — BP 126/76 | HR 68 | Temp 97.7°F | Resp 16 | Ht 74.0 in | Wt 168.2 lb

## 2016-06-18 DIAGNOSIS — L500 Allergic urticaria: Secondary | ICD-10-CM

## 2016-06-18 NOTE — Progress Notes (Signed)
  Subjective:    Patient ID: Blake Bradley, male    DOB: 06/10/1991, 25 y.o.   MRN: [id number]  HPI  This nice 25 yo SWM presents with c/o migratory hives and pruritis w/o identuified exposures. He also has hx/o IBS and remote colitis and reports occas mild abdominal discomfort w/o N/V/D/C or change in BM's.   Meds- none  Allergies  Allergen Reactions  . Morphine And Related Hives  . Prednisone Hives  . Tramadol Other (See Comments)    hallucinations    Past Medical History:  Diagnosis Date  . 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 ADD (attention deficit disorder)   . Allergy   . Anxiety   . Appendicitis with abscess 08/08/2014  . Bipolar disorder (HCC)   . Complication of anesthesia 06/28/2014   "HR dropped, couldn't get BP up when put to sleep for colonscopy"  . Family history of anesthesia complication    "grandmother had problems w/breathing" 06/29/2014)  . VOZDGUYQ(034.7Headache(784.0)    "maybe monthly" (06/29/2014)  . Hemorrhoids, internal, with bleeding 03/29/2015  . IBS (irritable bowel syndrome)   . Oppositional defiant disorder, moderate 11/08/2013   Past Surgical History:  Procedure Laterality Date  . APPENDECTOMY    . capsule endoscopy  04/13/2015  . COLONOSCOPY WITH PROPOFOL N/A 06/28/2014   Procedure: COLONOSCOPY WITH PROPOFOL;  Surgeon: Iva Booparl E Gessner, MD;  [facility];  Service: Endoscopy;  Laterality: N/A;  . 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 LAPAROSCOPIC APPENDECTOMY N/A 08/08/2014   Procedure: LAPAROSCOPIC APPENDECTOMY;  Surgeon: Manus RuddMatthew Tsuei, MD;  Location: [facility] OR;  Service: General;  Laterality: N/A;   Review of Systems  10 point systems review negative except as above.    Objective:   Physical Exam  BP 126/76   Pulse 68   Temp 97.7 F (36.5 C)   Resp 16   Ht 6\' 2"  (1.88 m)   Wt 168 lb 3.2 oz (76.3 kg)   BMI 21.60 kg/m   HEENT - Eac's patent. TM's Nl. EOM's full. PERRLA. NasoOroPharynx clear. Neck - supple. Nl Thyroid. Carotids 2+ & No bruits,  nodes, JVD Chest - Clear equal BS w/o Rales, rhonchi, wheezes. Cor - Nl HS. RRR w/o sig MGR. PP 1(+). No edema. Abd - Soft & benign. No palpable organomegaly, masses or tenderness. BS nl. MS- FROM w/o deformities. Muscle power, tone and bulk Nl. Gait Nl. Neuro - No obvious Cr N abnormalities. Sensory, motor and Cerebellar functions appear Nl w/o focal abnormalities. Psyche - Mental status normal & appropriate.  No delusions, ideations or obvious mood abnormalities. Skin - clear w/o evidence of hives at present and no rash seen.     Assessment & Plan:   1. Allergic urticaria  - Recommended trial of Cetirizine (Zyrtec) 10 mg in the am and Diphenhydramine (Benadryl) 50 mg at nite.  - discussed meds & side-effects - ROV - prn

## 2016-06-18 NOTE — Patient Instructions (Addendum)
Try Cetirizine 10 mg daily in am (Zyrtec)   &   take Diphenhydramine 25 mg  (Benadryl)  x 2 tablets (= 50 mg) at bedtime  ++++++++++++++++++++++++++++ Hives Hives are itchy, red, swollen areas of the skin. They can vary in size and location on your body. Hives can come and go for hours or several days (acute hives) or for several weeks (chronic hives). Hives do not spread from person to person (noncontagious). They may get worse with scratching, exercise, and emotional stress. CAUSES   Allergic reaction to food, additives, or drugs.  Infections, including the common cold.  Illness, such as vasculitis, lupus, or thyroid disease.  Exposure to sunlight, heat, or cold.  Exercise.  Stress.  Contact with chemicals. SYMPTOMS   Red or white swollen patches on the skin. The patches may change size, shape, and location quickly and repeatedly.  Itching.  Swelling of the hands, feet, and face. This may occur if hives develop deeper in the skin. DIAGNOSIS  Your caregiver can usually tell what is wrong by performing a physical exam. Skin or blood tests may also be done to determine the cause of your hives. In some cases, the cause cannot be determined. TREATMENT  Mild cases usually get better with medicines such as antihistamines. Severe cases may require an emergency epinephrine injection. If the cause of your hives is known, treatment includes avoiding that trigger.  HOME CARE INSTRUCTIONS   Avoid causes that trigger your hives.  Take antihistamines as directed by your caregiver to reduce the severity of your hives. Non-sedating or low-sedating antihistamines are usually recommended. Do not drive while taking an antihistamine.  Take any other medicines prescribed for itching as directed by your caregiver.  Wear loose-fitting clothing.  Keep all follow-up appointments as directed by your caregiver. SEEK MEDICAL CARE IF:   You have persistent or severe itching that is not  relieved with medicine.  You have painful or swollen joints. SEEK IMMEDIATE MEDICAL CARE IF:   You have a fever.  Your tongue or lips are swollen.  You have trouble breathing or swallowing.  You feel tightness in the throat or chest.  You have abdominal pain. These problems may be the first sign of a life-threatening allergic reaction. Call your local emergency services (911 in [place] MAKE SURE YOU:   Understand these instructions.  Will watch your condition.  Will get help right away if you are not doing well or get worse.

## 2016-07-14 ENCOUNTER — Telehealth: Payer: Self-pay | Admitting: Internal Medicine

## 2016-07-14 NOTE — Telephone Encounter (Signed)
Dr Kinnie ScalesMedoff declines referral.

## 2016-07-23 ENCOUNTER — Encounter: Payer: Self-pay | Admitting: Internal Medicine

## 2016-07-23 ENCOUNTER — Ambulatory Visit (INDEPENDENT_AMBULATORY_CARE_PROVIDER_SITE_OTHER): Payer: Self-pay | Admitting: Internal Medicine

## 2016-07-23 VITALS — BP 140/82 | HR 60 | Temp 97.9°F | Resp 16 | Ht 74.0 in | Wt 171.6 lb

## 2016-07-23 DIAGNOSIS — K580 Irritable bowel syndrome with diarrhea: Secondary | ICD-10-CM

## 2016-07-23 NOTE — Patient Instructions (Signed)
Irritable Bowel Syndrome, Adult Irritable bowel syndrome (IBS) is not one specific disease. It is a group of symptoms that affects the organs responsible for digestion (gastrointestinal or GI tract).  To regulate how your GI tract works, your body sends signals back and forth between your intestines and your brain. If you have IBS, there may be a problem with these signals. As a result, your GI tract does not function normally. Your intestines may become more sensitive and overreact to certain things. This is especially true when you eat certain foods or when you are under stress.  There are four types of IBS. These may be determined based on the consistency of your stool:   IBS with diarrhea.   IBS with constipation.   Mixed IBS.   Unsubtyped IBS.  It is important to know which type of IBS you have. Some treatments are more likely to be helpful for certain types of IBS.  CAUSES  The exact cause of IBS is not known. RISK FACTORS You may have a higher risk of IBS if:  You are a woman.  You are younger than 23 years old.  You have a family history of IBS.  You have mental health problems.  You have had bacterial infection of your GI tract. SIGNS AND SYMPTOMS  Symptoms of IBS vary from person to person. The main symptom is abdominal pain or discomfort. Additional symptoms usually include one or more of the following:   Diarrhea, constipation, or both.   Abdominal swelling or bloating after a meal.   Feeling full or sick after eating a small or regular-size meal.   Frequent gas.   Mucus in the stool.   A feeling of having more stool left after a bowel movement.  Symptoms tend to come and go. They may be associated with stress, psychiatric conditions, or nothing at all.  DIAGNOSIS  There is no specific test to diagnose IBS. Your health care provider will make a diagnosis based on a physical exam, medical history, and your symptoms. You may have other tests to rule out  other conditions that may be causing your symptoms. These may include:   Blood tests.   X-rays.   CT scan.  Endoscopy and colonoscopy. This is a test in which your GI tract is viewed with a long, thin, flexible tube. TREATMENT There is no cure for IBS, but treatment can help relieve symptoms. IBS treatment often includes:   Changes to your diet, such as:  Eating more fiber.  Avoiding foods that cause symptoms.  Drinking more water.  Eating regular, medium-sized portioned meals.  Medicines. These may include:  Fiber supplements if you have constipation.  Medicine to control diarrhea (antidiarrheal medicines).  Medicine to help control muscle spasms in your GI tract (antispasmodic medicines).  Medicines to help with any mental health issues, such as antidepressants or tranquilizers.  Therapy.  Talk therapy may help with anxiety, depression, or other mental health issues that can make IBS symptoms worse.  Stress reduction.  Managing your stress can help keep symptoms under control. HOME CARE INSTRUCTIONS   Take medicines only as directed by your health care provider.  Eat a healthy diet.  Avoid foods and drinks with added sugar.  Include more whole grains, fruits, and vegetables gradually into your diet. This may be especially helpful if you have IBS with constipation.  Avoid any foods and drinks that make your symptoms worse. These may include dairy products and caffeinated or carbonated drinks.  Do not  eat large meals.  Drink enough fluid to keep your urine clear or pale yellow.  Exercise regularly. Ask your health care provider for recommendations of good activities for you.  Keep all follow-up visits as directed by your health care provider. This is important. SEEK MEDICAL CARE IF:   You have constant pain.  You have trouble or pain with swallowing.  You have worsening diarrhea. SEEK IMMEDIATE MEDICAL CARE IF:   You have severe and worsening  abdominal pain.   You have diarrhea and:   You have a rash, stiff neck, or severe headache.   You are irritable, sleepy, or difficult to awaken.   You are weak, dizzy, or extremely thirsty.   You have bright red blood in your stool or you have black tarry stools.   You have unusual abdominal swelling that is painful.   You vomit continuously.   You vomit blood (hematemesis).   You have both abdominal pain and a fever.    +++++++++++++++++++++++ Diet for Irritable Bowel Syndrome When you have irritable bowel syndrome (IBS), the foods you eat and your eating habits are very important. IBS may cause various symptoms after eating, such as abdominal pain, constipation, or diarrhea. Choosing the right foods can help ease discomfort caused by these symptoms. Work with your health care provider and dietitian to find the best eating plan to help control your symptoms. WHAT GENERAL GUIDELINES DO I NEED TO FOLLOW?  Keep a food diary. This will help you identify foods that cause symptoms. Write down:  What you eat and when.  What symptoms you have.  When symptoms occur after eating a meal.  Avoid foods that cause symptoms. Talk with your dietitian about other ways to get the same nutrients that are in these foods.  Eat more foods that contain fiber. Take a fiber supplement if directed by your dietitian.  Eat your meals slowly, in a relaxed setting.  Aim to eat 5-6 small meals per day. Do not skip meals.  Drink enough fluids to keep your urine clear or pale yellow.  Ask your health care provider if you should take an over-the-counter probiotic during flare-ups to help restore healthy gut bacteria.  If you have cramping or diarrhea, try making your meals low in fat and high in carbohydrates. Examples of carbohydrates are pasta, rice, whole grain breads and cereals, fruits, and vegetables.  If dairy products cause your symptoms to flare up, try eating less of them. You  might be able to handle yogurt better than other dairy products because it contains bacteria that help with digestion. WHAT FOODS ARE NOT RECOMMENDED? The following are some foods and drinks that may worsen your symptoms:  Fatty foods, such as [place] fries.  Milk products, such as cheese or ice cream.  Chocolate.  Alcohol.  Products with caffeine, such as coffee.  Carbonated drinks, such as soda. The items listed above may not be a complete list of foods and beverages to avoid. Contact your dietitian for more information. WHAT FOODS ARE GOOD SOURCES OF FIBER? Your health care provider or dietitian may recommend that you eat more foods that contain fiber. Fiber can help reduce constipation and other IBS symptoms. Add foods with fiber to your diet a little at a time so that your body can get used to them. Too much fiber at once might cause gas and swelling of your abdomen. The following are some foods that are good sources of fiber:  Apples.  Peaches.  Pears.  Berries.  Figs.  Broccoli (raw).  Cabbage.  Carrots.  Raw peas.  Kidney beans.  Lima beans.  Whole grain bread.  Whole grain cereal.

## 2016-07-24 ENCOUNTER — Telehealth: Payer: Self-pay | Admitting: *Deleted

## 2016-07-24 MED ORDER — HYOSCYAMINE SULFATE 0.125 MG PO TABS: ORAL_TABLET | ORAL | 0 refills | Status: DC

## 2016-07-24 NOTE — Telephone Encounter (Signed)
Patient called and states he can not take Dicyclomine did yo causing a rash in the past.  Per Dr Oneta RackMcKeown, D/C the RX and [facility]  A message was left to inform the patient.

## 2016-07-26 NOTE — Progress Notes (Signed)
[facility]   Lucky CowboyWilliam Karys Meckley, M.D.    Dyanne CarrelAmanda R. Steffanie Dunnollier, P.A.-C      Terri Piedraourtney Forcucci, P.A.-C   [facility]                [address]                [place], [place] [id number] Telephone [phone]) [phone] Telefax [phone]) [phone] Subjective:    Patient ID: Blake GammonBrantley Kaminski, male    DOB: 05/28/1991, 25 y.o.   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 Patient has had episodic LLQ cramping and Diarrhea. In June 2016 , patient had a Negative Colonoscopy and in July 2016 , he had a Negative Capsule Endoscopy to r/o occult Colitis. None the less, he has been taking Azulfidine procured from his mother who does have a hx/o Colitis.    Meds - only Azulfidine as above   Allergies  Allergen Reactions  . Morphine And Related Hives  . Prednisone Hives  . Tramadol Other (See Comments)    hallucinations   . Dicyclomine Hcl Rash   Review of Systems  In addition to the HPI above,  No Fever-chills,  No Headache, No changes with Vision or hearing,  No problems swallowing food or Liquids,  No Chest pain or productive Cough or Shortness of Breath,  No Blood inUrine,  No dysuria,  No new skin rashes or bruises,  No new joints pains-aches,  No new weakness, tingling, numbness in any extremity,  No recent weight loss,  No polyuria, polydypsia or polyphagia,  No significant Mental Stressors.  A full 10 point Review of Systems was done, except as stated above, all other Review of Systems were negative  10 point systems review negative except as above.    Objective:   Physical Exam  BP 140/82   Pulse 60   Temp 97.9 F (36.6 C)   Resp 16   Ht 6\' 2"  (1.88 m)   Wt 171 lb 9.6 oz (77.8 kg)   BMI 22.03 kg/m   HEENT - Eac's patent. TM's Nl. EOM's full. PERRLA.  NasoOroPharynx clear. Neck - supple. Nl Thyroid. Carotids 2+ & No bruits, nodes, JVD Chest - Clear equal BS w/o Rales, rhonchi, wheezes. Cor - Nl HS. RRR w/o sig MGR. PP 1(+). No edema. Abd - No palpable organomegaly, masses w/sl LLQ tenderness w/o guarding or RB.  BS nl. MS- FROM w/o deformities. Muscle power, tone and bulk Nl. Gait Nl. Neuro - No obvious Cr N abnormalities. Sensory, motor and Cerebellar functions appear Nl w/o focal abnormalities.    Assessment & Plan:   1. Irritable bowel syndrome with diarrhea  - Diet for IBS discussed - Hycosamine 0.125 mg - take 1-2 tabs TID ac

## 2016-08-22 ENCOUNTER — Emergency Department (HOSPITAL_COMMUNITY)
Admission: EM | Admit: 2016-08-22 | Discharge: 2016-08-22 | Disposition: A | Discharge status: Home / Self Care | Payer: Self-pay | Attending: Emergency Medicine | Admitting: Emergency Medicine

## 2016-08-22 ENCOUNTER — Encounter (HOSPITAL_COMMUNITY): Payer: Self-pay | Admitting: *Deleted

## 2016-08-22 ENCOUNTER — Emergency Department (HOSPITAL_COMMUNITY): Payer: Self-pay

## 2016-08-22 DIAGNOSIS — F909 Attention-deficit hyperactivity disorder, unspecified type: Secondary | ICD-10-CM | POA: Insufficient documentation

## 2016-08-22 DIAGNOSIS — R079 Chest pain, unspecified: Secondary | ICD-10-CM

## 2016-08-22 LAB — CBC
HEMATOCRIT: 46.9 % (ref 39.0–52.0)
HEMOGLOBIN: 16.5 g/dL (ref 13.0–17.0)
MCH: 31.7 pg (ref 26.0–34.0)
MCHC: 35.2 g/dL (ref 30.0–36.0)
MCV: 90.2 fL (ref 78.0–100.0)
Platelets: 234 10*3/uL (ref 150–400)
RBC: 5.2 MIL/uL (ref 4.22–5.81)
RDW: 12.2 % (ref 11.5–15.5)
WBC: 6.5 10*3/uL (ref 4.0–10.5)

## 2016-08-22 LAB — BASIC METABOLIC PANEL
ANION GAP: 9 (ref 5–15)
BUN: 14 mg/dL (ref 6–20)
CHLORIDE: 104 mmol/L (ref 101–111)
CO2: 27 mmol/L (ref 22–32)
Calcium: 9.8 mg/dL (ref 8.9–10.3)
Creatinine, Ser: 1.02 mg/dL (ref 0.61–1.24)
GFR calc Af Amer: >60 mL/min (ref >60)
GFR calc non Af Amer: >60 mL/min (ref >60)
GLUCOSE: 89 mg/dL (ref 65–99)
POTASSIUM: 3.6 mmol/L (ref 3.5–5.1)
Sodium: 140 mmol/L (ref 135–145)

## 2016-08-22 LAB — I-STAT TROPONIN, ED: Troponin i, poc: 0 ng/mL (ref 0.00–0.08)

## 2016-08-22 LAB — D-DIMER, QUANTITATIVE (NOT AT ARMC)

## 2016-08-22 MED ORDER — LORAZEPAM 1 MG PO TABS: 1.0000 mg | ORAL_TABLET | Freq: Three times a day (TID) | ORAL | 0 refills | Status: DC | PRN

## 2016-08-22 MED ORDER — KETOROLAC TROMETHAMINE 30 MG/ML IJ SOLN
30.0000 mg | Freq: Once | INTRAMUSCULAR | Status: AC
  Administered 2016-08-22: 30 mg via INTRAMUSCULAR
  Filled 2016-08-22: qty 1

## 2016-08-22 MED ORDER — LORAZEPAM 1 MG PO TABS
1.0000 mg | ORAL_TABLET | Freq: Once | ORAL | Status: AC
Start: 2016-08-22 — End: 2016-08-22
  Administered 2016-08-22: 1 mg via ORAL
  Filled 2016-08-22: qty 1

## 2016-08-22 NOTE — ED Triage Notes (Signed)
Pt states he was at work, felt like he was going to pass out, he was able to lower himself to ground.  Began experiencing chest pain and became diaphoretic.  Is scheduled to see pcp for chest pain.

## 2016-08-22 NOTE — Discharge Instructions (Signed)
Please read and follow all provided instructions.  Your diagnoses today include:  1. Chest pain, unspecified type     Tests performed today include: An EKG of your heart A chest x-ray Cardiac enzymes - a blood test for heart muscle damage Blood counts and electrolytes Vital signs. See below for your results today.   Medications prescribed:   Take any prescribed medications only as directed.  Follow-up instructions: Please follow-up with your primary care provider as soon as you can for further evaluation of your symptoms.   Return instructions:  SEEK IMMEDIATE MEDICAL ATTENTION IF: You have severe chest pain, especially if the pain is crushing or pressure-like and spreads to the arms, back, neck, or jaw, or if you have sweating, nausea (feeling sick to your stomach), or shortness of breath. THIS IS AN EMERGENCY. Don't wait to see if the pain will go away. Get medical help at once. Call 911 or 0 (operator). DO NOT drive yourself to the hospital.  Your chest pain gets worse and does not go away with rest.  You have an attack of chest pain lasting longer than usual, despite rest and treatment with the medications your caregiver has prescribed.  You wake from sleep with chest pain or shortness of breath. You feel dizzy or faint. You have chest pain not typical of your usual pain for which you originally saw your caregiver.  You have any other emergent concerns regarding your health.  Additional Information: Chest pain comes from many different causes. Your caregiver has diagnosed you as having chest pain that is not specific for one problem, but does not require admission.  You are at low risk for an acute heart condition or other serious illness.   Your vital signs today were: BP 134/74    Pulse 60    Temp 98.1 F (36.7 C) (Oral)    Resp 10    Ht 6\' 2"  (1.88 m)    Wt 71.7 kg    SpO2 100%    BMI 20.29 kg/m  If your blood pressure (BP) was elevated above 135/85 this visit, please have  this repeated by your doctor within one month. --------------

## 2016-08-22 NOTE — ED Provider Notes (Signed)
[facility]-EMERGENCY DEPT Provider Note   CSN: [id number] Arrival date & time: 08/22/16  1745  History   Chief Complaint Chief Complaint  Patient presents with  . Chest Pain  . Near Syncope    HPI Blake Bradley is a 25 y.o. male.  HPI  25 y.o. male presents to the Emergency Department today complaining of chest pain x 4-5 days with similar episode today. 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 Proceeded to go outside and became diaphoretic. Noted chest pain for 10-15 minutes. Had near syncopal episode that caused him to drop to his knees. No N/V. Notes dizziness afterwards. Pt states that he has been having these chest pain episodes sporadically the past 4-5 days without provocation. Notes occurring during sleep as well. Each episode lasts 10-15 minutes and is isolated to left side/flank. No palpitations. No SOB/ABD pain. No fevers. Notes pain currently. Scheduled to see PCP on Monday for this issue. No recent travel. No recent surgeries. No hx DVT/PE. No risk factors for ACS including HTN, HLD, DM, FH, CAD, Smoking or Obesity.   Past Medical History:  Diagnosis Date  . 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 ADD (attention deficit disorder)   . Allergy   . Anxiety   . Appendicitis with abscess 08/08/2014  . Bipolar disorder (HCC)   . Complication of anesthesia 06/28/2014   "HR dropped, couldn't get BP up when put to sleep for colonscopy"  . Family history of anesthesia complication    "grandmother had problems w/breathing" 06/29/2014)  . [id number](478.2)    "maybe monthly" (06/29/2014)  . Hemorrhoids, internal, with bleeding 03/29/2015  . IBS (irritable bowel syndrome)   . Oppositional defiant disorder, moderate 11/08/2013    Patient Active Problem List   Diagnosis Date Noted  . IBS (irritable bowel syndrome) 03/29/2015  . Hemorrhoids, internal, with bleeding 03/29/2015  . Uninsured 03/27/2015  . Nausea and vomiting 06/30/2014    . Nausea vomiting and diarrhea 06/20/2014  . 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 Bipolar disorder, unspecified 11/08/2013  . Oppositional defiant disorder, moderate 11/08/2013  . Allergy   . ADD (attention deficit disorder)     Past Surgical History:  Procedure Laterality Date  . APPENDECTOMY    . capsule endoscopy  04/13/2015  . COLONOSCOPY WITH PROPOFOL N/A 06/28/2014   Procedure: COLONOSCOPY WITH PROPOFOL;  Surgeon: Iva Boop, MD;  Location: [facility] ENDOSCOPY;  Service: Endoscopy;  Laterality: N/A;  . 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 LAPAROSCOPIC APPENDECTOMY N/A 08/08/2014   Procedure: LAPAROSCOPIC APPENDECTOMY;  Surgeon: Manus Rudd, MD;  Location: [facility] OR;  Service: General;  Laterality: N/A;       Home Medications    Prior to Admission medications   Medication Sig Start Date End Date Taking? Authorizing Provider  dicyclomine (BENTYL) 10 MG capsule Take 10 mg by mouth daily as needed for spasms.   Yes Historical Provider, MD  hyoscyamine (LEVSIN, ANASPAZ) 0.125 MG tablet Take 1-2 tablets by mouth tid pc for IBS. Patient not taking: Reported on 08/22/2016 07/24/16   [name], MD    Family History Family History  Problem Relation Age of Onset  . Breast cancer Mother   . Ulcerative colitis Mother   . Colon cancer Neg Hx   . Esophageal cancer Neg Hx   . Rectal cancer Neg Hx   . Stomach cancer Neg Hx     Social History Social History  Substance  Use Topics  . Smoking status: Never Smoker  . Smokeless tobacco: Never Used  . Alcohol use No     Allergies   Morphine and related; Prednisone; Tramadol; and Dicyclomine hcl   Review of Systems Review of Systems ROS reviewed and all are negative for acute change except as noted in the HPI.  Physical Exam Updated Vital Signs BP 140/72   Pulse 63   Temp 98.1 F (36.7 C) (Oral)   Resp 18   Ht 6\' 2"  (1.88 m)   Wt 71.7 kg   SpO2 100%   BMI 20.29 kg/m   Physical Exam  Constitutional: He is  oriented to person, place, and time. Vital signs are normal. He appears well-developed and well-nourished.  HENT:  Head: Normocephalic.  Right Ear: Hearing normal.  Left Ear: Hearing normal.  Eyes: Conjunctivae and EOM are normal. Pupils are equal, round, and reactive to light.  Neck: Normal range of motion. Neck supple.  Cardiovascular: Normal rate, regular rhythm, normal heart sounds and intact distal pulses.   Pulmonary/Chest: Effort normal and breath sounds normal.  Chest wall non TTP  Abdominal: Soft.  Musculoskeletal: Normal range of motion.  Neurological: He is alert and oriented to person, place, and time.  Skin: Skin is warm and dry.  Psychiatric: He has a normal mood and affect. His speech is normal and behavior is normal. Thought content normal.  Nursing note and vitals reviewed.  ED Treatments / Results  Labs (all labs ordered are listed, but only abnormal results are displayed) Labs Reviewed  BASIC METABOLIC PANEL  CBC  D-DIMER, QUANTITATIVE (NOT AT [facility])  Rosezena SensorI-STAT TROPOININ, ED   EKG  EKG Interpretation  Date/Time:  Friday August 22 2016 17:51:32 EST Ventricular Rate:  66 PR Interval:  134 QRS Duration: 94 QT Interval:  366 QTC Calculation: 383 R Axis:   83 Text Interpretation:  Sinus rhythm with marked sinus arrhythmia Otherwise normal ECG No significant change since last tracing Confirmed by YAO  MD, DAVID ([id number]) on 08/22/2016 7:47:46 PM      Radiology Dg Chest 2 View  Result Date: 08/22/2016 CLINICAL DATA:  Near syncopal episode today. Dizziness. Mid sternal chest pain. Shortness of breath. EXAM: CHEST  2 VIEW COMPARISON:  04/15/2015 FINDINGS: Heart size is normal. Mediastinal shadows are normal. The lungs are clear. No bronchial thickening. No infiltrate, mass, effusion or collapse. Pulmonary vascularity is normal. No bony abnormality. IMPRESSION: Normal chest Electronically Signed   By: Paulina FusiMark  Shogry M.D.   On: 08/22/2016 18:43     Procedures Procedures (including critical care time)  Medications Ordered in ED Medications - No data to display   Initial Impression / Assessment and Plan / ED Course  I have reviewed the triage vital signs and the nursing notes.  Pertinent labs & imaging results that were available during my care of the patient were reviewed by me and considered in my medical decision making (see chart for details).  Clinical Course    Final Clinical Impressions(s) / ED Diagnoses  I have reviewed and evaluated the relevant laboratory values I have reviewed and evaluated the relevant imaging studies. I have interpreted the relevant EKG. I have reviewed the relevant previous healthcare records. I obtained HPI from historian. Patient discussed with supervising physician  ED Course:  Assessment: Pt is a 25yM presents with CP intermittent 4-5 days. Similar episode today, but with associated diaphoresis prior. Had near syncopal episode prior to chest pain. Risk Factors None. Given toradol in ED. Patient is  to be discharged with recommendation to follow up with PCP in regards to today's hospital visit on Monday. Chest pain is not likely of cardiac or pulmonary etiology d/t presentation, VSS, no tracheal deviation, no JVD or new murmur, RRR, breath sounds equal bilaterally, EKG without acute abnormalities, negative troponin, and negative CXR. Heart Score 0. D Dimer negative. Suspect Anxiety related? Possible Holter monitor with PCP. Pt has been advised start a NSAIDs and return to the ED is CP becomes exertional, associated with diaphoresis or nausea, radiates to left jaw/arm, worsens or becomes concerning in any way. Pt appears reliable for follow up and is agreeable to discharge. Patient is in no acute distress. Vital Signs are stable. Patient is able to ambulate. Patient able to tolerate PO.   Disposition/Plan:  DC Home Additional Verbal discharge instructions given and discussed with patient.  Pt  Instructed to f/u with PCP in the next week for evaluation and treatment of symptoms. Return precautions given Pt acknowledges and agrees with plan  Supervising Physician Charlynne Panderavid Hsienta Yao, MD   Final diagnoses:  Chest pain, unspecified type    New Prescriptions New Prescriptions   No medications on file     Audry Piliyler Lindley Stachnik, PA-C 08/22/16 2225    Charlynne Panderavid Hsienta Yao, MD 08/22/16 2336

## 2016-08-22 NOTE — ED Notes (Signed)
ED Provider at bedside. 

## 2016-08-25 ENCOUNTER — Ambulatory Visit (INDEPENDENT_AMBULATORY_CARE_PROVIDER_SITE_OTHER): Payer: Self-pay | Admitting: Internal Medicine

## 2016-08-25 ENCOUNTER — Encounter: Payer: Self-pay | Admitting: Internal Medicine

## 2016-08-25 VITALS — BP 124/66 | HR 60 | Temp 98.2°F | Resp 16 | Ht 73.0 in | Wt 165.0 lb

## 2016-08-25 DIAGNOSIS — K580 Irritable bowel syndrome with diarrhea: Secondary | ICD-10-CM

## 2016-08-25 DIAGNOSIS — F5101 Primary insomnia: Secondary | ICD-10-CM

## 2016-08-25 DIAGNOSIS — M940 Chondrocostal junction syndrome [Tietze]: Secondary | ICD-10-CM

## 2016-08-25 MED ORDER — TRAZODONE HCL 150 MG PO TABS: ORAL_TABLET | ORAL | 0 refills | Status: DC

## 2016-08-25 NOTE — Patient Instructions (Addendum)
Costochondritis Costochondritis, sometimes called Tietze syndrome, is a swelling and irritation (inflammation) of the tissue (cartilage) that connects your ribs with your breastbone (sternum). It causes pain in the chest and rib area. Costochondritis usually goes away on its own over time. It can take up to 6 weeks or longer to get better, especially if you are unable to limit your activities.   CAUSES  Some cases of costochondritis have no known cause. Possible causes include:  Injury (trauma).  Exercise or activity such as lifting.  Severe coughing.   SIGNS AND SYMPTOMS  Pain and tenderness in the chest and rib area.  Pain that gets worse when coughing or taking deep breaths.  Pain that gets worse with specific movements.   DIAGNOSIS  Your health care provider will do a physical exam and ask about your symptoms. Chest X-rays or other tests may be done to rule out other problems.  TREATMENT  Costochondritis usually goes away on its own over time. Your health care provider may prescribe medicine to help relieve pain.  HOME CARE INSTRUCTIONS   Avoid exhausting physical activity. Try not to strain your ribs during normal activity. This would include any activities using chest, abdominal, and side muscles, especially if heavy weights are used.  Apply  Heating pad or ice to the affected area after the pain begins.   Only take over-the-counter or prescription medicines as directed by your health care provider.   MAKE SURE YOU:   Understand these instructions.  Will watch your condition. +++++++++++++++++++++++++++++ Insomnia Insomnia is a sleep disorder that makes it difficult to fall asleep or to stay asleep. Insomnia can cause tiredness (fatigue), low energy, difficulty concentrating, mood swings, and poor performance at work or school.  There are three different ways to classify insomnia:  Difficulty falling asleep.  Difficulty staying asleep.  Waking up too early  in the morning. Any type of insomnia can be long-term (chronic) or short-term (acute). Both are common. Short-term insomnia usually lasts for three months or less. Chronic insomnia occurs at least three times a week for longer than three months. CAUSES  Insomnia may be caused by another condition, situation, or substance, such as:  Anxiety.  Certain medicines.  Gastroesophageal reflux disease (GERD) or other gastrointestinal conditions.  Asthma or other breathing conditions.  Restless legs syndrome, sleep apnea, or other sleep disorders.  Chronic pain.  Menopause. This may include hot flashes.  Stroke.  Abuse of alcohol, tobacco, or illegal drugs.  Depression.  Caffeine.   Neurological disorders, such as Alzheimer disease.  An overactive thyroid (hyperthyroidism). The cause of insomnia may not be known. RISK FACTORS Risk factors for insomnia include:  Gender. Women are more commonly affected than men.  Age. Insomnia is more common as you get older.  Stress. This may involve your professional or personal life.  Income. Insomnia is more common in people with lower income.  Lack of exercise.   Irregular work schedule or night shifts.  Traveling between different time zones. SIGNS AND SYMPTOMS If you have insomnia, trouble falling asleep or trouble staying asleep is the main symptom. This may lead to other symptoms, such as:  Feeling fatigued.  Feeling nervous about going to sleep.  Not feeling rested in the morning.  Having trouble concentrating.  Feeling irritable, anxious, or depressed. TREATMENT  Treatment for insomnia depends on the cause. If your insomnia is caused by an underlying condition, treatment will focus on addressing the condition. Treatment may also include:   Medicines to help  you sleep.  Counseling or therapy.  Lifestyle adjustments. HOME CARE INSTRUCTIONS   Take medicines only as directed by your health care provider.  Keep  regular sleeping and waking hours. Avoid naps.  Keep a sleep diary to help you and your health care provider figure out what could be causing your insomnia. Include:   When you sleep.  When you wake up during the night.  How well you sleep.   How rested you feel the next day.  Any side effects of medicines you are taking.  What you eat and drink.   Make your bedroom a comfortable place where it is easy to fall asleep:  Put up shades or special blackout curtains to block light from outside.  Use a white noise machine to block noise.  Keep the temperature cool.   Exercise regularly as directed by your health care provider. Avoid exercising right before bedtime.  Use relaxation techniques to manage stress. Ask your health care provider to suggest some techniques that may work well for you. These may include:  Breathing exercises.  Routines to release muscle tension.  Visualizing peaceful scenes.  Cut back on alcohol, caffeinated beverages, and cigarettes, especially close to bedtime. These can disrupt your sleep.  Do not overeat or eat spicy foods right before bedtime. This can lead to digestive discomfort that can make it hard for you to sleep.  Limit screen use before bedtime. This includes:  Watching TV.  Using your smartphone, tablet, and computer.  Stick to a routine. This can help you fall asleep faster. Try to do a quiet activity, brush your teeth, and go to bed at the same time each night.  Get out of bed if you are still awake after 15 minutes of trying to sleep. Keep the lights down, but try reading or doing a quiet activity. When you feel sleepy, go back to bed.  Make sure that you drive carefully. Avoid driving if you feel very sleepy.  Keep all follow-up appointments as directed by your health care provider. This is important. SEEK MEDICAL CARE IF:   You are tired throughout the day or have trouble in your daily routine due to sleepiness.  You  continue to have sleep problems or your sleep problems get worse. SEEK IMMEDIATE MEDICAL CARE IF:   You have serious thoughts about hurting yourself or someone else.   This information is not intended to replace advice given to you by your health care provider. Make sure you discuss any questions you have with your health care provider.   Document Released: 09/26/2000 Document Revised: 06/20/2015 Document Reviewed: 06/30/2014 Elsevier Interactive Patient Education [employer]2016 Elsevier Inc.

## 2016-08-25 NOTE — Progress Notes (Signed)
[place] [facility]   Blake Bradley, M.D.    Blake Bradley, P.A.-C      Blake Bradley, P.A.-C  [facility]                [address]                [place], [place] [id number] Telephone [phone] Telefax [phone] Subjective:    Patient ID: Blake Bradley, male    DOB: 08/09/1991, 25 y.o.   MRN: [id number]  HPI  Patient is a 25 yo single WM unemployed & living at home who was seen and evaluated at the ER 3 days ago and had a Normal/negative CXR, EKG, Troponin and D-dimer and was discharged on 5 tabs of Lorazepam which mad him feel great.  Patient has a long hx/o social dysfunction and lack of motivation and has been disruptive and punitive toward his parents. After a long visit today his mother called back relating that he "came home & cried stating nobody is helping him with his nerves" and she was informed that the topic never came up, but he did discuss poor sleep hygiene.  Medication Sig Dispense  . dicyclomine (BENTYL) 10 MG  Take 10 mg by mouth daily as needed for spasms.   Marland Kitchen. LORazepam (ATIVAN) 1 MG  Take 1 tab 8 hours as needed for anxiety. 5 tablet   Review of Systems  In addition to the HPI above,  No Fever-chills,  No Headache, No changes with Vision or hearing,  No problems swallowing food or Liquids,  Had recent sharp Chest pain and chest wall soreness, No Abdominal pain, No Nausea or Vomitting, Bowel movements are regular,  No Blood in stool or Urine,  No dysuria,  No new skin rashes or bruises,  No new joints pains-aches,  No new weakness, tingling, numbness in any extremity,  No recent weight loss,  No polyuria, polydypsia or polyphagia,  A full 10 point Review of Systems was done, except as stated above, all other Review of Systems were negative     Objective:   Physical Exam  BP 124/66   Pulse 60   Temp 98.2 F (36.8 C) (Temporal)   Resp 16   Ht 6\' 1"  (1.854 m)   Wt 165 lb (74.8 kg)    BMI 21.77 kg/m   HEENT - Eac's patent. TM's Nl. EOM's full. PERRLA. NasoOroPharynx clear. Neck - supple. Nl Thyroid. Carotids 2+ & No bruits, nodes, JVD Chest - Slight tender left lateral chest wall. Clear equal BS w/o Rales, rhonchi, wheezes. Cor - Nl HS. RRR w/o sig MGR. PP 1(+). No edema. Abd - No palpable organomegaly, masses or tenderness. BS nl. MS- FROM w/o deformities. Muscle power, tone and bulk Nl. Gait Nl. Neuro - No obvious Cr N abnormalities. Sensory, motor and Cerebellar functions appear Nl w/o focal abnormalities.    Assessment & Plan:   1. Costochondritis   2. Irritable bowel syndrome with diarrhea   3. Primary insomnia  - traZODone (DESYREL) 150 MG tablet; Take 1/3 to 1/2 to 1 tablet 1 hour before sleep  Dispense: 90 tablet; Refill: 0  - After the visit in along discussion with patient's mother, Blake Bradley, I advised he that I feel Blake Bradley is a prime candidate for controlled substance habituation and misuse and that I will not prescribe him controlled substances esp. Opioids or benzo's. I strongly urged her to direct/encourage him to contact [facility] for help in  coping with his emotional / social issues.

## 2016-10-08 ENCOUNTER — Encounter (HOSPITAL_COMMUNITY): Payer: Self-pay | Admitting: *Deleted

## 2016-10-08 ENCOUNTER — Emergency Department (HOSPITAL_COMMUNITY)
Admission: EM | Admit: 2016-10-08 | Discharge: 2016-10-08 | Disposition: A | Discharge status: Home / Self Care | Payer: Self-pay | Attending: Emergency Medicine | Admitting: Emergency Medicine

## 2016-10-08 ENCOUNTER — Telehealth: Payer: Self-pay | Admitting: Internal Medicine

## 2016-10-08 DIAGNOSIS — Z5321 Procedure and treatment not carried out due to patient leaving prior to being seen by health care provider: Secondary | ICD-10-CM | POA: Insufficient documentation

## 2016-10-08 DIAGNOSIS — K649 Unspecified hemorrhoids: Secondary | ICD-10-CM | POA: Insufficient documentation

## 2016-10-08 NOTE — Telephone Encounter (Signed)
Father Requesting referral to Specialist for hemorrhoids. Per Dr Oneta RackMcKeown, patient already established with GI, patient able to contact them directly.

## 2016-10-08 NOTE — ED Notes (Signed)
This RN went to assess pt, pt no where to be found. Pollina MD states he saw pt and family walking by and left. Tiffany PA aware

## 2016-10-08 NOTE — ED Triage Notes (Signed)
Patient presents stating he thinks one of his hemorrhoids ruptured due to the amount of bleeding

## 2016-10-08 NOTE — ED Provider Notes (Signed)
Patient left after triage but before being seen by provider   Marlon Peliffany Casson Catena, PA-C 10/08/16 [phone]    Gilda Creasehristopher J Pollina, MD 10/08/16 [phone]

## 2016-10-09 ENCOUNTER — Ambulatory Visit: Payer: Self-pay | Admitting: Internal Medicine

## 2016-10-15 ENCOUNTER — Ambulatory Visit: Payer: Self-pay | Admitting: Internal Medicine

## 2016-10-15 ENCOUNTER — Ambulatory Visit (INDEPENDENT_AMBULATORY_CARE_PROVIDER_SITE_OTHER): Payer: Self-pay | Admitting: Internal Medicine

## 2016-10-15 VITALS — BP 116/68 | HR 76 | Temp 97.7°F | Resp 16 | Ht 73.0 in | Wt 165.2 lb

## 2016-10-15 DIAGNOSIS — Z91199 Patient's noncompliance with other medical treatment and regimen due to unspecified reason: Secondary | ICD-10-CM

## 2016-10-15 DIAGNOSIS — K648 Other hemorrhoids: Secondary | ICD-10-CM

## 2016-10-15 DIAGNOSIS — Z9119 Patient's noncompliance with other medical treatment and regimen: Secondary | ICD-10-CM

## 2016-10-15 MED ORDER — HYDROCORTISONE ACETATE 25 MG RE SUPP: RECTAL | 0 refills | Status: DC

## 2016-10-15 NOTE — Patient Instructions (Signed)
Hemorrhoids Hemorrhoids are swollen veins in and around the rectum or anus. There are two types of hemorrhoids:  Internal hemorrhoids. These occur in the veins that are just inside the rectum. They may poke through to the outside and become irritated and painful.  External hemorrhoids. These occur in the veins that are outside of the anus and can be felt as a painful swelling or hard lump near the anus.  Most hemorrhoids do not cause serious problems, and they can be managed with home treatments such as diet and lifestyle changes. If home treatments do not help your symptoms, procedures can be done to shrink or remove the hemorrhoids. What are the causes? This condition is caused by increased pressure in the anal area. This pressure may result from various things, including:  Constipation.  Straining to have a bowel movement.  Diarrhea.  Pregnancy.  Obesity.  Sitting for long periods of time.  Heavy lifting or other activity that causes you to strain.  Anal sex.  What are the signs or symptoms? Symptoms of this condition include:  Pain.  Anal itching or irritation.  Rectal bleeding.  Leakage of stool (feces).  Anal swelling.  One or more lumps around the anus.  How is this diagnosed? This condition can often be diagnosed through a visual exam. Other exams or tests may also be done, such as:  Examination of the rectal area with a gloved hand (digital rectal exam).  Examination of the anal canal using a small tube (anoscope).  A blood test, if you have lost a significant amount of blood.  A test to look inside the colon (sigmoidoscopy or colonoscopy).  How is this treated? This condition can usually be treated at home. However, various procedures may be done if dietary changes, lifestyle changes, and other home treatments do not help your symptoms. These procedures can help make the hemorrhoids smaller or remove them completely. Some of these procedures involve  surgery, and others do not. Common procedures include:  Rubber band ligation. Rubber bands are placed at the base of the hemorrhoids to cut off the blood supply to them.  Sclerotherapy. Medicine is injected into the hemorrhoids to shrink them.  Infrared coagulation. A type of light energy is used to get rid of the hemorrhoids.  Hemorrhoidectomy surgery. The hemorrhoids are surgically removed, and the veins that supply them are tied off.  Stapled hemorrhoidopexy surgery. A circular stapling device is used to remove the hemorrhoids and use staples to cut off the blood supply to them.  Follow these instructions at home: Eating and drinking  Eat foods that have a lot of fiber in them, such as whole grains, beans, nuts, fruits, and vegetables. Ask your health care provider about taking products that have added fiber (fiber supplements).  Drink enough fluid to keep your urine clear or pale yellow. Managing pain and swelling  Take warm sitz baths for 20 minutes, 3-4 times a day to ease pain and discomfort.  If directed, apply ice to the affected area. Using ice packs between sitz baths may be helpful. ? Put ice in a plastic bag. ? Place a towel between your skin and the bag. ? Leave the ice on for 20 minutes, 2-3 times a day. General instructions  Take over-the-counter and prescription medicines only as told by your health care provider.  Use medicated creams or suppositories as told.  Exercise regularly.  Go to the bathroom when you have the urge to have a bowel movement. Do not wait.    Avoid straining to have bowel movements.  Keep the anal area dry and clean. Use wet toilet paper or moist towelettes after a bowel movement.  Do not sit on the toilet for long periods of time. This increases blood pooling and pain. Contact a health care provider if:  You have increasing pain and swelling that are not controlled by treatment or medicine.  You have uncontrolled bleeding.  You  have difficulty having a bowel movement, or you are unable to have a bowel movement.  You have pain or inflammation outside the area of the hemorrhoids. This information is not intended to replace advice given to you by your health care provider. Make sure you discuss any questions you have with your health care provider. Document Released: 09/26/2000 Document Revised: 02/27/2016 Document Reviewed: 06/13/2015 Elsevier Interactive Patient Education  2017 Elsevier Inc.  

## 2016-10-17 ENCOUNTER — Encounter: Payer: Self-pay | Admitting: Internal Medicine

## 2016-10-17 DIAGNOSIS — Z91199 Patient's noncompliance with other medical treatment and regimen due to unspecified reason: Secondary | ICD-10-CM | POA: Insufficient documentation

## 2016-10-17 DIAGNOSIS — Z9119 Patient's noncompliance with other medical treatment and regimen: Secondary | ICD-10-CM | POA: Insufficient documentation

## 2016-10-17 HISTORY — DX: Patient's noncompliance with other medical treatment and regimen: Z91.19

## 2016-10-17 HISTORY — DX: Patient's noncompliance with other medical treatment and regimen due to unspecified reason: Z91.199

## 2016-10-17 NOTE — Progress Notes (Signed)
[facility]   Lucky CowboyWilliam Josten Warmuth, M.D.    Dyanne CarrelAmanda R. Steffanie Dunnollier, P.A.-C      Terri Piedraourtney Forcucci, P.A.-C  [facility]                [address]                [place], [place] [id number] Telephone [phone] Telefax [phone]) [phone]  Subjective:    Patient ID: Blake Bradley, male    DOB: 08/19/1991, 26 y.o.   MRN: [id number]  HPI  Patient is a 26 yo Single WM who presented to the ER 12/27 with c/o rectal bleeding and after being checked in at Triage he left the ER with his parents before being seen by a provider. Now 10 days later he presents with c/o intermittent BRRB.   Medication Sig  . dicyclomine (BENTYL) 10 MG capsule Take 10 mg by mouth daily as needed for spasms.  Marland Kitchen. LORazepam (ATIVAN) 1 MG tablet Take 1 tablet (1 mg total) by mouth every 8 (eight) hours as needed for anxiety.  . traZODone (DESYREL) 150 MG tablet Take 1/3 to 1/2 to 1 tablet 1 hour before sleep   Allergies  Allergen Reactions  . Morphine And Related Hives  . Prednisone Hives  . Tramadol Other (See Comments)    hallucinations   . Dicyclomine Hcl Rash   Past Medical History:  Diagnosis Date  . 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 ADD (attention deficit disorder)   . Allergy   . Anxiety   . Appendicitis with abscess 08/08/2014  . Bipolar disorder (HCC)   . Complication of anesthesia 06/28/2014   "HR dropped, couldn't get BP up when put to sleep for colonscopy"  . Family history of anesthesia complication    "grandmother had problems w/breathing" 06/29/2014)  . GUYQIHKV(425.9Headache(784.0)    "maybe monthly" (06/29/2014)  . Hemorrhoids, internal, with bleeding 03/29/2015  . IBS (irritable bowel syndrome)   . Oppositional defiant disorder, moderate 11/08/2013    Review of Systems  10 point systems review negative except as above.    Objective:   Physical Exam  BP 116/68   Pulse 76   Temp 97.7 F (36.5 C)   Resp 16   Ht 6\' 1"  (1.854 m)   Wt 165 lb 3.2 oz (74.9  kg)   BMI 21.80 kg/m   HEENT - Eac's patent. TM's Nl. EOM's full. PERRLA. NasoOroPharynx clear. Neck - supple. Nl Thyroid. Carotids 2+ & No bruits, nodes, JVD Chest - Clear equal BS w/o Rales, rhonchi, wheezes. Cor - Nl HS. RRR w/o sig MGR. PP 1(+). No edema. Abd - Soft benign. BS nl. No tenderness or masses.  DRE- No external hemorrhoids, fissures or tags evident. Digital rectal exam finds an internal hemorrhoid and no masses otherwise and hemoccult is negative.  MS- FROM w/o deformities. Muscle power, tone and bulk Nl. Gait Nl. Neuro -  w/o focal abnormalities. Psyche - Mental status with  "la belle indifference " affect.      Assessment & Plan:   1. Internal bleeding hemorrhoids  - hydrocortisone (ANUSOL-HC) 25 MG suppository; Use 1 suppository rectally 3 to 4 x/day if needed  Dispense: 30 suppository; Refill: 0  - Ambulatory referral to Gastroenterology

## 2016-10-20 ENCOUNTER — Emergency Department (HOSPITAL_BASED_OUTPATIENT_CLINIC_OR_DEPARTMENT_OTHER)
Admission: EM | Admit: 2016-10-20 | Discharge: 2016-10-21 | Disposition: A | Discharge status: Home / Self Care | Payer: Self-pay | Attending: Dermatology | Admitting: Dermatology

## 2016-10-20 ENCOUNTER — Encounter (HOSPITAL_BASED_OUTPATIENT_CLINIC_OR_DEPARTMENT_OTHER): Payer: Self-pay

## 2016-10-20 DIAGNOSIS — Z5321 Procedure and treatment not carried out due to patient leaving prior to being seen by health care provider: Secondary | ICD-10-CM | POA: Insufficient documentation

## 2016-10-20 DIAGNOSIS — R21 Rash and other nonspecific skin eruption: Secondary | ICD-10-CM | POA: Insufficient documentation

## 2016-10-20 NOTE — ED Triage Notes (Addendum)
Pt c/o rash x 2 weeks-was seen by PCP 1/5 for same-was seen by 1/5-dx with allergic reaction to possible detergent-prt c/o feeling tightness when he swallows since 330pm-pt with no resp distress-dry mouth noted-no swelling visualized-when allergies/meds reviewed pt states he is talking bentyl-advised his allergy list notes bentyl with rash reaction from 07/2016-pt was assessed by RT

## 2016-10-21 NOTE — ED Notes (Signed)
Pt not in room, gown laying on bed

## 2016-10-23 ENCOUNTER — Telehealth: Payer: Self-pay | Admitting: Internal Medicine

## 2016-10-23 ENCOUNTER — Encounter: Payer: Self-pay | Admitting: Internal Medicine

## 2016-10-23 NOTE — Telephone Encounter (Signed)
patient's father called and stated patient was having abdominal cramping, Dr Oneta RackMcKeown recommends patinet  go to the Emergency Department for evaluation. Advised father, he stated understanding.

## 2016-10-26 ENCOUNTER — Encounter (HOSPITAL_COMMUNITY): Payer: Self-pay

## 2016-10-26 DIAGNOSIS — R1012 Left upper quadrant pain: Secondary | ICD-10-CM | POA: Insufficient documentation

## 2016-10-26 DIAGNOSIS — F909 Attention-deficit hyperactivity disorder, unspecified type: Secondary | ICD-10-CM | POA: Insufficient documentation

## 2016-10-26 LAB — URINALYSIS, ROUTINE W REFLEX MICROSCOPIC
Bilirubin Urine: NEGATIVE
Glucose, UA: NEGATIVE mg/dL
HGB URINE DIPSTICK: NEGATIVE
Ketones, ur: NEGATIVE mg/dL
LEUKOCYTES UA: NEGATIVE
Nitrite: NEGATIVE
Protein, ur: NEGATIVE mg/dL
SPECIFIC GRAVITY, URINE: 1.021 (ref 1.005–1.030)
pH: 5 (ref 5.0–8.0)

## 2016-10-26 LAB — CBC
HCT: 45.3 % (ref 39.0–52.0)
HEMOGLOBIN: 16.1 g/dL (ref 13.0–17.0)
MCH: 31.9 pg (ref 26.0–34.0)
MCHC: 35.5 g/dL (ref 30.0–36.0)
MCV: 89.7 fL (ref 78.0–100.0)
Platelets: 233 10*3/uL (ref 150–400)
RBC: 5.05 MIL/uL (ref 4.22–5.81)
RDW: 13 % (ref 11.5–15.5)
WBC: 10.8 10*3/uL — ABNORMAL HIGH (ref 4.0–10.5)

## 2016-10-26 LAB — COMPREHENSIVE METABOLIC PANEL
ALT: 19 U/L (ref 17–63)
ANION GAP: 9 (ref 5–15)
AST: 24 U/L (ref 15–41)
Albumin: 4.2 g/dL (ref 3.5–5.0)
Alkaline Phosphatase: 77 U/L (ref 38–126)
BUN: 11 mg/dL (ref 6–20)
CHLORIDE: 105 mmol/L (ref 101–111)
CO2: 27 mmol/L (ref 22–32)
Calcium: 9.2 mg/dL (ref 8.9–10.3)
Creatinine, Ser: 1.02 mg/dL (ref 0.61–1.24)
GFR calc Af Amer: >60 mL/min (ref >60)
GFR calc non Af Amer: >60 mL/min (ref >60)
Glucose, Bld: 79 mg/dL (ref 65–99)
Potassium: 3.5 mmol/L (ref 3.5–5.1)
SODIUM: 141 mmol/L (ref 135–145)
Total Bilirubin: 0.7 mg/dL (ref 0.3–1.2)
Total Protein: 6.9 g/dL (ref 6.5–8.1)

## 2016-10-26 LAB — LIPASE, BLOOD: LIPASE: 45 U/L (ref 11–51)

## 2016-10-26 NOTE — ED Triage Notes (Signed)
Onset 6 days constant pain underneath left rib cage and nausea.  Onset 1 week ago red, flat rash that itches and burns on bilateral legs.  Pt has had blood on stool, bright red x 4-5 times- last time today.

## 2016-10-27 ENCOUNTER — Emergency Department (HOSPITAL_COMMUNITY)
Admission: EM | Admit: 2016-10-27 | Discharge: 2016-10-27 | Disposition: A | Discharge status: Home / Self Care | Payer: Self-pay | Attending: Emergency Medicine | Admitting: Emergency Medicine

## 2016-10-27 ENCOUNTER — Emergency Department (HOSPITAL_COMMUNITY): Payer: Self-pay

## 2016-10-27 DIAGNOSIS — R1012 Left upper quadrant pain: Secondary | ICD-10-CM

## 2016-10-27 LAB — PROTIME-INR
INR: 1.06
Prothrombin Time: 13.8 seconds (ref 11.4–15.2)

## 2016-10-27 MED ORDER — ONDANSETRON HCL 4 MG/2ML IJ SOLN
4.0000 mg | Freq: Once | INTRAMUSCULAR | Status: AC
  Administered 2016-10-27: 4 mg via INTRAVENOUS
  Filled 2016-10-27: qty 2

## 2016-10-27 MED ORDER — FENTANYL CITRATE (PF) 100 MCG/2ML IJ SOLN
100.0000 ug | Freq: Once | INTRAMUSCULAR | Status: AC
  Administered 2016-10-27: 100 ug via INTRAVENOUS
  Filled 2016-10-27: qty 2

## 2016-10-27 MED ORDER — DICYCLOMINE HCL 10 MG PO CAPS
10.0000 mg | ORAL_CAPSULE | Freq: Once | ORAL | Status: AC
  Administered 2016-10-27: 10 mg via ORAL
  Filled 2016-10-27: qty 1

## 2016-10-27 MED ORDER — PANTOPRAZOLE SODIUM 20 MG PO TBEC: 20.0000 mg | DELAYED_RELEASE_TABLET | Freq: Two times a day (BID) | ORAL | 0 refills | Status: DC

## 2016-10-27 MED ORDER — FENTANYL CITRATE (PF) 100 MCG/2ML IJ SOLN: 50.0000 ug | Freq: Once | INTRAMUSCULAR | Status: DC

## 2016-10-27 MED ORDER — PANTOPRAZOLE SODIUM 40 MG IV SOLR
40.0000 mg | Freq: Once | INTRAVENOUS | Status: AC
  Administered 2016-10-27: 40 mg via INTRAVENOUS
  Filled 2016-10-27: qty 40

## 2016-10-27 MED ORDER — IOPAMIDOL (ISOVUE-300) INJECTION 61%
INTRAVENOUS | Status: AC
  Administered 2016-10-27: 100 mL via INTRAVENOUS
  Filled 2016-10-27: qty 100

## 2016-10-27 NOTE — ED Notes (Signed)
Patient returned from CT

## 2016-10-27 NOTE — ED Notes (Signed)
Patient transported to CT 

## 2016-10-27 NOTE — ED Notes (Signed)
Called CT to see what the delay was

## 2016-10-27 NOTE — Discharge Instructions (Signed)
You were seen today for abdominal pain. The cause of your pain is unknown at this time. Your workup is all reassuring. You need to follow-up again with her GI doctor for further referral and your primary physician.

## 2016-10-27 NOTE — ED Provider Notes (Signed)
[facility]EMERGENCY DEPT Provider Note   CSN: [id number] Arrival date & time: 10/26/16  2236  By signing my name below, I, Vista Mink, attest that this documentation has been prepared under the direction and in the presence of Shon Baton, MD. Electronically signed, Vista Mink, ED Scribe. 10/27/16. 2:18 AM.  History   Chief Complaint Chief Complaint  Patient presents with  . Abdominal Pain    HPI HPI Comments: Blake Bradley is a 26 y.o. male, with Hx of IBS, Appendicitis, Hemorrhoids, who presents to the Emergency Department complaining of sharp, intermittent LUQ abdominal pain with associated nausea that started 6 days ago. Pt's abdominal pain has worsened since onset and currently rates it 8/10. He describes it as under his left rib cage. His pain is exacerbated when eating and during movement. Pt also complains of diffuse painful, pruritic rash that flares up on different areas of his body and then resolves. He has had this rash for approximately 9 days. He states that he woke up a few days ago with the rash on his nose. His father also states that he thought it looked like shingles when the rash was present on his left hip, it has since resolved in that area. He currently notes two small "splotches" on bilateral LE. He also reports episodes of hematochezia within the past week, last episode today. This is a frequent occurrence for him as he has IBS. He was followed by Dr. Leone Payor with [facility] but states that Dr. Leone Payor will no longer see him because his case is "complicated". He notes that he has had increased urinary frequency this week but denies any other urinary symptoms. No diarrhea. No Hx of similar abdominal pain. No known injury. No sore throat, shortness of breath.  The history is provided by the patient. No language interpreter was used.    Past Medical History:  Diagnosis Date  . 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 ADD (attention deficit disorder)   . Allergy   . Anxiety   .  Appendicitis with abscess 08/08/2014  . Bipolar disorder (HCC)   . Complication of anesthesia 06/28/2014   "HR dropped, couldn't get BP up when put to sleep for colonscopy"  . Family history of anesthesia complication    "grandmother had problems w/breathing" 06/29/2014)  . WUJWJXBJ(478.2)    "maybe monthly" (06/29/2014)  . Hemorrhoids, internal, with bleeding 03/29/2015  . IBS (irritable bowel syndrome)   . Oppositional defiant disorder, moderate 11/08/2013   Patient Active Problem List   Diagnosis Date Noted  . Poor compliance 10/17/2016  . IBS (irritable bowel syndrome) 03/29/2015  . Hemorrhoids, internal, with bleeding 03/29/2015  . Uninsured 03/27/2015  . Nausea and vomiting 06/30/2014  . Nausea vomiting and diarrhea 06/20/2014  . 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 Bipolar disorder, unspecified 11/08/2013  . Oppositional defiant disorder, moderate 11/08/2013  . Allergy   . ADD (attention deficit disorder)     Past Surgical History:  Procedure Laterality Date  . APPENDECTOMY    . capsule endoscopy  04/13/2015  . COLONOSCOPY WITH PROPOFOL N/A 06/28/2014   Procedure: COLONOSCOPY WITH PROPOFOL;  Surgeon: Iva Boop, MD;  Location: [facility] ENDOSCOPY;  Service: Endoscopy;  Laterality: N/A;  . 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 LAPAROSCOPIC APPENDECTOMY N/A 08/08/2014   Procedure: LAPAROSCOPIC APPENDECTOMY;  Surgeon: Manus Rudd, MD;  Location: [facility] OR;  Service: General;  Laterality: N/A;       Home Medications    Prior to Admission medications  Medication Sig Start Date End Date Taking? Authorizing Provider  dicyclomine (BENTYL) 20 MG tablet Take 10 mg by mouth daily.   Yes Historical Provider, MD    Family History Family History  Problem Relation Age of Onset  . Breast cancer Mother   . Ulcerative colitis Mother   . Colon cancer Neg Hx   . Esophageal cancer Neg Hx   . Rectal cancer Neg Hx   . Stomach cancer Neg Hx     Social History Social History    Substance Use Topics  . Smoking status: Never Smoker  . Smokeless tobacco: Never Used  . Alcohol use Yes     Comment: ocassionally     Allergies   Morphine and related; Prednisone; Tramadol; and Dicyclomine hcl   Review of Systems Review of Systems  HENT: Negative for sore throat.   Respiratory: Negative for shortness of breath.   Gastrointestinal: Positive for abdominal pain (LUQ), blood in stool and nausea. Negative for diarrhea and vomiting.  Genitourinary: Positive for frequency. Negative for dysuria and hematuria.  Skin: Positive for rash.  All other systems reviewed and are negative.    Physical Exam Updated Vital Signs BP 125/67   Pulse (!) 58   Temp 99.1 F (37.3 C) (Oral)   Resp 16   Ht 6\' 2"  (1.88 m)   Wt 165 lb 12.8 oz (75.2 kg)   SpO2 100%   BMI 21.29 kg/m   Physical Exam  Constitutional: He is oriented to person, place, and time. He appears well-developed and well-nourished. No distress.  HENT:  Head: Normocephalic and atraumatic.  Cardiovascular: Normal rate, regular rhythm and normal heart sounds.   No murmur heard. Pulmonary/Chest: Effort normal and breath sounds normal. No respiratory distress. He has no wheezes.  Abdominal: Soft. Bowel sounds are normal. There is tenderness. There is no rebound and no guarding.  Left upper quadrant tenderness to palpation without rebound or guarding  Musculoskeletal: He exhibits no edema.  Neurological: He is alert and oriented to person, place, and time.  Skin: Skin is warm and dry.  Several areas specifically on the right lower extremity less than 2 cm in diameter with erythematous base and petechiae noted, nonblanching.  Psychiatric: He has a normal mood and affect.  Nursing note and vitals reviewed.    ED Treatments / Results  DIAGNOSTIC STUDIES: Oxygen Saturation is 100% on RA, normal by my interpretation.  COORDINATION OF CARE: 2:18 AM-Discussed treatment plan with pt at bedside and pt agreed to  plan.   Labs (all labs ordered are listed, but only abnormal results are displayed) Labs Reviewed  CBC - Abnormal; Notable for the following:       Result Value   WBC 10.8 (*)    All other components within normal limits  LIPASE, BLOOD  COMPREHENSIVE METABOLIC PANEL  URINALYSIS, ROUTINE W REFLEX MICROSCOPIC  PROTIME-INR  POC OCCULT BLOOD, ED    EKG  EKG Interpretation None       Radiology No results found.  Procedures Procedures (including critical care time)  Medications Ordered in ED Medications  ondansetron (ZOFRAN) injection 4 mg (4 mg Intravenous Given 10/27/16 0220)  pantoprazole (PROTONIX) injection 40 mg (40 mg Intravenous Given 10/27/16 0220)  fentaNYL (SUBLIMAZE) injection 100 mcg (100 mcg Intravenous Given 10/27/16 0413)     Initial Impression / Assessment and Plan / ED Course  I have reviewed the triage vital signs and the nursing notes.  Pertinent labs & imaging results that were available during my  care of the patient were reviewed by me and considered in my medical decision making (see chart for details).  Clinical Course     Patient presents with abdominal pain. History of same. History of IBS. Left upper quadrant. He is nontoxic. Afebrile. Basic labwork obtained and reassuring including hemoglobin which is at the patient's baseline. No peritonitis on exam. I have reviewed the patient's chart. He has had extensive GI and abdominal workup. Was to be referred to [facility] or [facility] but that has not happened. Patient was given Bentyl, GI cocktail, Protonix.  3:45 AM On recheck, patient continues to endorse pain. I discussed with patient that CT scan would likely not be of high yield given his history. However, patient would like to proceed with CT because of his persistent pain and this pain is "different." CT was ordered and patient was given phenyl for pain.  CT reassuring. Patient encouraged to follow-up with his GI doctor for further referral as well as his  primary physician. Final Clinical Impressions(s) / ED Diagnoses   Final diagnoses:  Left upper quadrant pain    New Prescriptions New Prescriptions   No medications on file   I personally performed the services described in this documentation, which was scribed in my presence. The recorded information has been reviewed and is accurate.     Shon Baton, MD 10/28/16 [phone]

## 2016-11-04 ENCOUNTER — Telehealth: Payer: Self-pay | Admitting: Internal Medicine

## 2016-11-05 NOTE — Telephone Encounter (Signed)
Ok with switch  

## 2016-11-05 NOTE — Telephone Encounter (Signed)
ok 

## 2016-11-05 NOTE — Telephone Encounter (Signed)
I prefer that he stay with Dr. Leone PayorGessner who has seen the patient several times in the office and is familiar

## 2016-11-11 ENCOUNTER — Ambulatory Visit (INDEPENDENT_AMBULATORY_CARE_PROVIDER_SITE_OTHER): Payer: Self-pay | Admitting: Internal Medicine

## 2016-11-11 ENCOUNTER — Encounter: Payer: Self-pay | Admitting: Internal Medicine

## 2016-11-11 ENCOUNTER — Telehealth: Payer: Self-pay | Admitting: *Deleted

## 2016-11-11 VITALS — BP 114/62 | HR 62 | Temp 98.4°F | Resp 16 | Ht 73.0 in | Wt 160.0 lb

## 2016-11-11 DIAGNOSIS — M940 Chondrocostal junction syndrome [Tietze]: Secondary | ICD-10-CM

## 2016-11-11 DIAGNOSIS — J029 Acute pharyngitis, unspecified: Secondary | ICD-10-CM

## 2016-11-11 MED ORDER — MELOXICAM 15 MG PO TABS: 15.0000 mg | ORAL_TABLET | Freq: Every day | ORAL | 1 refills | Status: DC

## 2016-11-11 MED ORDER — CYCLOBENZAPRINE HCL 10 MG PO TABS: 10.0000 mg | ORAL_TABLET | Freq: Three times a day (TID) | ORAL | 0 refills | Status: DC | PRN

## 2016-11-11 MED ORDER — FLUTICASONE PROPIONATE 50 MCG/ACT NA SUSP: 2.0000 | Freq: Every day | NASAL | 0 refills | Status: DC

## 2016-11-11 NOTE — Progress Notes (Signed)
Assessment and Plan:   1. Acute pharyngitis, unspecified etiology -no red flags -low pretest probability for strep throat -no need for abx -mobic -tylenol as needed -likely viral vs. Allergic rhinitis -flonase -daily antihistamine  2. Slipping rib syndrome -mobic -flexeril as needed at bedtime -reassured that abdomen CT pelvis was normal and the imaging of the lower ribcage on examination is normal -also discussed the possibility of left sided abdominal oblique strain -patient can try using heating pad as well to help with relief.         HPI 25 y.o.male presents within his 30 day dismissal period for evaluation of sore throat and left sided chest pain.  Patient reports that for the last 3 days he has been having sore throat and has noticed some white patches on his throat.  He has also been having no change in his condition.  He has been having some running nose and an occasional cough.  He reports that he is having a lot of post nasal drainage.  He reports that he has not tried any over the counter medications for it so far.  He reports that there have been no sick contacts.     He also notes that he is having some pain along the left costophrenic angle.  He notes that when he pushes on it it feels painful and he is getting pain radiating up to his abdomen.  He has not been doing any activities out of the ordinary.  NO heavy lifting or pushing.  He has no injury that he can think of.  He has not been taking tylenol or ibuprofen at all.  He reports that he has been taking bentyl and protonix for his IBS.     Past Medical History:  Diagnosis Date  . 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 ADD (attention deficit disorder)   . Allergy   . Anxiety   . Appendicitis with abscess 08/08/2014  . Bipolar disorder (HCC)   . Complication of anesthesia 06/28/2014   "HR dropped, couldn't get BP up when put to sleep for colonscopy"  . Family history of anesthesia complication    "grandmother had  problems w/breathing" 06/29/2014)  . AOZHYQMV(784.6)    "maybe monthly" (06/29/2014)  . Hemorrhoids, internal, with bleeding 03/29/2015  . IBS (irritable bowel syndrome)   . Oppositional defiant disorder, moderate 11/08/2013     Allergies  Allergen Reactions  . Morphine And Related Hives  . Prednisone Hives  . Tramadol Other (See Comments)    hallucinations   . Dicyclomine Hcl Rash      Current Outpatient Prescriptions on File Prior to Visit  Medication Sig Dispense Refill  . dicyclomine (BENTYL) 20 MG tablet Take 10 mg by mouth daily.    . pantoprazole (PROTONIX) 20 MG tablet Take 1 tablet (20 mg total) by mouth 2 (two) times daily. 60 tablet 0   No current facility-administered medications on file prior to visit.    Review of Systems  Constitutional: Negative for chills, fever and malaise/fatigue.  HENT: Positive for congestion and sore throat. Negative for ear pain, hearing loss and sinus pain.   Respiratory: Positive for cough. Negative for sputum production, shortness of breath and wheezing.   Cardiovascular: Negative for chest pain, palpitations and leg swelling.     Physical Exam: Filed Weights   11/11/16 1551  Weight: 160 lb (72.6 kg)   BP 114/62   Pulse 62   Temp 98.4 F (36.9 C) (Temporal)   Resp 16   Ht  6\' 1"  (1.854 m)   Wt 160 lb (72.6 kg)   BMI 21.11 kg/m  General Appearance: Well developed well nourished, non-toxic appearing in no apparent distress. Eyes: PERRLA, EOMs, conjunctiva w/ no swelling or erythema or discharge Sinuses: No Frontal/maxillary tenderness ENT/Mouth: Ear canals clear without swelling or erythema.  TM's normal bilaterally with no retractions, bulging, or loss of landmarks.  Oropharynx with mild erythema.  No trismus.  Uvula midline. Neck: Supple, thyroid normal, no notable JVD  Respiratory: Respiratory effort normal, Clear breath sounds anteriorly and posteriorly bilaterally without rales, rhonchi, wheezing or stridor. No retractions  or accessory muscle usage.  Tenderness to palpation of the left costophrenic angle.  No instability or movement.   Cardio: RRR with no MRGs.   Abdomen: Soft, + BS.  Left upper quadrant and epigastric pain to palpation, no guarding, rebound, hernias, masses.  Musculoskeletal: Full ROM, 5/5 strength, normal gait.  Skin: Warm, dry without rashes  Neuro: Awake and oriented X 3, Cranial nerves intact. Normal muscle tone, no cerebellar symptoms. Sensation intact.  Psych: normal affect, Insight and Judgment appropriate.     Terri Piedraourtney Forcucci, PA-C 4:05 PM [facility]

## 2016-11-11 NOTE — Telephone Encounter (Signed)
Patient's father called and states the patient is having a popping sound in his ribs when he moves. He also has a sore throat.  Per Dr Oneta RackMcKeown, he can send in a CXR order, if the patient request it.  The patient is being seen this PM for a sore throat.

## 2016-11-11 NOTE — Patient Instructions (Signed)
Please take meloxicam once daily with food.  Please do not take it if you have not eaten.  Please make sure you drink plenty of water with this medication. Do not take ibuprofen, advil, or aleve.  It will be alright to take tylenol with this.   Please take flexeril if needed for pain.  This will make you tired and sleepy.  Do not drive within 4 hours after taking it.   Please use flonase 2 sprays per nostril right before you go to bed.  Please look down at your toes while you spray it and do not snort it up.  It sprays where it is designed to go.  Please take claritin, zyrtec, OR, allegra once daily to dry up congestion.    You may also try a heating pad.  Do not sleep on the heating pad as this will cause burns.

## 2016-11-14 ENCOUNTER — Emergency Department (HOSPITAL_COMMUNITY)
Admission: EM | Admit: 2016-11-14 | Discharge: 2016-11-14 | Disposition: A | Discharge status: Home / Self Care | Payer: Self-pay | Attending: Emergency Medicine | Admitting: Emergency Medicine

## 2016-11-14 ENCOUNTER — Ambulatory Visit (HOSPITAL_COMMUNITY)
Admission: EM | Admit: 2016-11-14 | Discharge: 2016-11-14 | Disposition: A | Discharge status: Home / Self Care | Payer: Self-pay | Attending: Family Medicine | Admitting: Family Medicine

## 2016-11-14 ENCOUNTER — Encounter (HOSPITAL_COMMUNITY): Payer: Self-pay | Admitting: Emergency Medicine

## 2016-11-14 ENCOUNTER — Encounter (HOSPITAL_COMMUNITY): Payer: Self-pay | Admitting: Family Medicine

## 2016-11-14 DIAGNOSIS — R21 Rash and other nonspecific skin eruption: Secondary | ICD-10-CM

## 2016-11-14 DIAGNOSIS — Z79899 Other long term (current) drug therapy: Secondary | ICD-10-CM | POA: Insufficient documentation

## 2016-11-14 DIAGNOSIS — L989 Disorder of the skin and subcutaneous tissue, unspecified: Secondary | ICD-10-CM | POA: Insufficient documentation

## 2016-11-14 DIAGNOSIS — J029 Acute pharyngitis, unspecified: Secondary | ICD-10-CM | POA: Insufficient documentation

## 2016-11-14 DIAGNOSIS — R238 Other skin changes: Secondary | ICD-10-CM

## 2016-11-14 DIAGNOSIS — L299 Pruritus, unspecified: Secondary | ICD-10-CM

## 2016-11-14 DIAGNOSIS — M545 Low back pain: Secondary | ICD-10-CM

## 2016-11-14 MED ORDER — HYDROXYZINE HCL 25 MG PO TABS: 25.0000 mg | ORAL_TABLET | Freq: Four times a day (QID) | ORAL | 0 refills | Status: DC

## 2016-11-14 NOTE — ED Provider Notes (Signed)
CSN: [id number]     Arrival date & time 11/14/16  1911 History   None    Chief Complaint  Patient presents with  . Rash  . Leg Pain   (Consider location/radiation/quality/duration/timing/severity/associated sxs/prior Treatment) Patient c/o rash on lower extremites.  Patient states he has pain radiating down bilateral legs.  He was seen by a PA at his PCP and was rx'd mobic and cyclobenzaprine.    The history is provided by the patient.  Rash  Location:  Leg Leg rash location:  L lower leg and R lower leg Quality: itchiness and redness   Severity:  Moderate Onset quality:  Sudden Duration:  2 days Timing:  Constant Chronicity:  New Relieved by:  Nothing Worsened by:  Nothing Ineffective treatments:  None tried Leg Pain    Past Medical History:  Diagnosis Date  . 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 ADD (attention deficit disorder)   . Allergy   . Anxiety   . Appendicitis with abscess 08/08/2014  . Bipolar disorder (HCC)   . Complication of anesthesia 06/28/2014   "HR dropped, couldn't get BP up when put to sleep for colonscopy"  . Family history of anesthesia complication    "grandmother had problems w/breathing" 06/29/2014)  . WUJWJXBJ(478.2)    "maybe monthly" (06/29/2014)  . Hemorrhoids, internal, with bleeding 03/29/2015  . IBS (irritable bowel syndrome)   . Oppositional defiant disorder, moderate 11/08/2013   Past Surgical History:  Procedure Laterality Date  . APPENDECTOMY    . capsule endoscopy  04/13/2015  . COLONOSCOPY WITH PROPOFOL N/A 06/28/2014   Procedure: COLONOSCOPY WITH PROPOFOL;  Surgeon: Iva Boop, MD;  Location: [facility] ENDOSCOPY;  Service: Endoscopy;  Laterality: N/A;  . 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 LAPAROSCOPIC APPENDECTOMY N/A 08/08/2014   Procedure: LAPAROSCOPIC APPENDECTOMY;  Surgeon: Manus Rudd, MD;  Location: [facility] OR;  Service: General;  Laterality: N/A;   Family History  Problem Relation Age of Onset  . Breast cancer Mother   .  Ulcerative colitis Mother   . Colon cancer Neg Hx   . Esophageal cancer Neg Hx   . Rectal cancer Neg Hx   . Stomach cancer Neg Hx    Social History  Substance Use Topics  . Smoking status: Never Smoker  . Smokeless tobacco: Never Used  . Alcohol use Yes     Comment: ocassionally    Review of Systems  Constitutional: Negative.   HENT: Negative.   Eyes: Negative.   Respiratory: Negative.   Cardiovascular: Negative.   Gastrointestinal: Negative.   Endocrine: Negative.   Genitourinary: Negative.   Musculoskeletal: Positive for joint swelling.  Skin: Positive for rash.  Allergic/Immunologic: Negative.   Neurological: Positive for numbness.  Psychiatric/Behavioral: Negative.     Allergies  Morphine and related; Prednisone; Tramadol; and Dicyclomine hcl  Home Medications   Prior to Admission medications   Medication Sig Start Date End Date Taking? Authorizing Provider  cyclobenzaprine (FLEXERIL) 10 MG tablet Take 1 tablet (10 mg total) by mouth 3 (three) times daily as needed for muscle spasms. 11/11/16   Courtney Forcucci, PA-C  dicyclomine (BENTYL) 20 MG tablet Take 10 mg by mouth daily.    Historical Provider, MD  fluticasone (FLONASE) 50 MCG/ACT nasal spray Place 2 sprays into both nostrils daily. 11/11/16   Courtney Forcucci, PA-C  hydrOXYzine (ATARAX/VISTARIL) 25 MG tablet Take 1 tablet (25 mg total) by mouth every 6 (six) hours. 11/14/16   Deatra Canter, FNP  meloxicam (  MOBIC) 15 MG tablet Take 1 tablet (15 mg total) by mouth daily. 11/11/16   Courtney Forcucci, PA-C  pantoprazole (PROTONIX) 20 MG tablet Take 1 tablet (20 mg total) by mouth 2 (two) times daily. 10/27/16   Shon Batonourtney F Horton, MD   Meds Ordered and Administered this Visit  Medications - No data to display  BP 123/81   Pulse 89   Temp 98.6 F (37 C)   Resp 18   SpO2 100%  No data found.   Physical Exam  Constitutional: He is oriented to person, place, and time. He appears well-developed and  well-nourished.  HENT:  Head: Normocephalic and atraumatic.  Eyes: EOM are normal. Pupils are equal, round, and reactive to light.  Neck: Normal range of motion. Neck supple.  Cardiovascular: Normal rate, regular rhythm and normal heart sounds.   Pulmonary/Chest: Effort normal and breath sounds normal.  Neurological: He is alert and oriented to person, place, and time.  Skin: Rash noted.  Macular rash on bilateral lower extremities.  Nursing note and vitals reviewed.   Urgent Care Course     Procedures (including critical care time)  Labs Review Labs Reviewed - No data to display  Imaging Review No results found.   Visual Acuity Review  Right Eye Distance:   Left Eye Distance:   Bilateral Distance:    Right Eye Near:   Left Eye Near:    Bilateral Near:         MDM   1. Rash   2. Itching   3. Low back pain, unspecified back pain laterality, unspecified chronicity, with sciatica presence unspecified    Hydroxyzine 25mg  one to two po qid prn #24  Continue mobic and cyclobenzaprine previously ordered.      Deatra CanterWilliam J Talaya Lamprecht, FNP 11/14/16 2031

## 2016-11-14 NOTE — ED Triage Notes (Signed)
Pt presents with rash to BLE described as burning; progressively worsening and spreading from lower legs to upper legs; pt also c/o swallowing difficulties; pt states this has been an ongoing problem for 2 months; pt reports changed detergents to hypoallergenic and washed all bedding and clothing in same without improvement; was seen at urgent care today for same- wanting second opinion

## 2016-11-14 NOTE — ED Triage Notes (Signed)
Pt here for rash on legs that is spreading and getting worse. sts also shooting pain in legs and up body.

## 2016-11-14 NOTE — ED Provider Notes (Signed)
[facility]-EMERGENCY DEPT Provider Note   CSN: [id number] Arrival date & time: 11/14/16  2035  By signing my name below, I, Rosario AdieWilliam Andrew Hiatt, attest that this documentation has been prepared under the direction and in the presence of Fayrene HelperBowie Rylen Swindler, PA-C.  Electronically Signed: Rosario AdieWilliam Andrew Hiatt, ED Scribe. 11/14/16. 10:12 PM.  History   Chief Complaint Chief Complaint  Patient presents with  . Rash  . Dysphagia   The history is provided by the patient and medical records. No language interpreter was used.    HPI Comments: Blake Bradley is a 26 y.o. male who presents to the Emergency Department complaining of persistent, gradually worsening rash to his bilateral lower extremities and right hand beginning two months ago. He describes his pain as burning. Pt notes that his rash initial began on his right lower leg, however, it has been worsening to his left leg and this morning he noticed it on his right wrist. Pt denies his rash as being pruritic or turning into blistering. He notes associated intermittent sharp pains to his bilateral lower extremities. Pt also notes that over the past two days that he has had a mild sore throat as well. No new soaps, lotions, detergents, foods, animals, plants, or medications. He additionally notes that during the initial onset of his rash two months ago of his rash he changed to using a hypoallergenic detergents but without relief of his rash. Pt was seen in UC today prior to coming into the ED, and at that time he was prescribed Hydroxyzine 25mg . He was additionally seen by his PCP recently for this issue as well, and at that time he was prescribed Mobic and Cyclobenzaprine. He has been taking both of these medications and using Benadryl at home without significant relief. Pt has a h/o steroidal related allergies, but none otherwise. He has f/u w/ his Dermatologist in three days. Pt denies fever, rhinorrhea, sneezing, cough, ear pain, or any other associated  symptoms.   PCP: Nadean CorwinMCKEOWN,WILLIAM DAVID, MD   Past Medical History:  Diagnosis Date  . 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 ADD (attention deficit disorder)   . Allergy   . Anxiety   . Appendicitis with abscess 08/08/2014  . Bipolar disorder (HCC)   . Complication of anesthesia 06/28/2014   "HR dropped, couldn't get BP up when put to sleep for colonscopy"  . Family history of anesthesia complication    "grandmother had problems w/breathing" 06/29/2014)  . WUJWJXBJ(478.2Headache(784.0)    "maybe monthly" (06/29/2014)  . Hemorrhoids, internal, with bleeding 03/29/2015  . IBS (irritable bowel syndrome)   . Oppositional defiant disorder, moderate 11/08/2013   Patient Active Problem List   Diagnosis Date Noted  . Poor compliance 10/17/2016  . IBS (irritable bowel syndrome) 03/29/2015  . Hemorrhoids, internal, with bleeding 03/29/2015  . Uninsured 03/27/2015  . Nausea and vomiting 06/30/2014  . Nausea vomiting and diarrhea 06/20/2014  . 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 Bipolar disorder, unspecified 11/08/2013  . Oppositional defiant disorder, moderate 11/08/2013  . Allergy   . ADD (attention deficit disorder)    Past Surgical History:  Procedure Laterality Date  . APPENDECTOMY    . capsule endoscopy  04/13/2015  . COLONOSCOPY WITH PROPOFOL N/A 06/28/2014   Procedure: COLONOSCOPY WITH PROPOFOL;  Surgeon: Iva Booparl E Gessner, MD;  [facility];  Service: Endoscopy;  Laterality: N/A;  . 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 LAPAROSCOPIC APPENDECTOMY N/A 08/08/2014   Procedure: LAPAROSCOPIC APPENDECTOMY;  Surgeon: Manus RuddMatthew Tsuei, MD;  Location: [facility] OR;  Service: General;  Laterality: N/A;    Home Medications    Prior to Admission medications   Medication Sig Start Date End Date Taking? Authorizing Provider  cyclobenzaprine (FLEXERIL) 10 MG tablet Take 1 tablet (10 mg total) by mouth 3 (three) times daily as needed for muscle spasms. 11/11/16   Courtney Forcucci, PA-C  dicyclomine (BENTYL) 20 MG  tablet Take 10 mg by mouth daily.    Historical Provider, MD  fluticasone (FLONASE) 50 MCG/ACT nasal spray Place 2 sprays into both nostrils daily. 11/11/16   Courtney Forcucci, PA-C  hydrOXYzine (ATARAX/VISTARIL) 25 MG tablet Take 1 tablet (25 mg total) by mouth every 6 (six) hours. 11/14/16   Deatra Canter, FNP  meloxicam (MOBIC) 15 MG tablet Take 1 tablet (15 mg total) by mouth daily. 11/11/16   Courtney Forcucci, PA-C  pantoprazole (PROTONIX) 20 MG tablet Take 1 tablet (20 mg total) by mouth 2 (two) times daily. 10/27/16   Shon Baton, MD   Family History Family History  Problem Relation Age of Onset  . Breast cancer Mother   . Ulcerative colitis Mother   . Colon cancer Neg Hx   . Esophageal cancer Neg Hx   . Rectal cancer Neg Hx   . Stomach cancer Neg Hx    Social History Social History  Substance Use Topics  . Smoking status: Never Smoker  . Smokeless tobacco: Never Used  . Alcohol use Yes     Comment: ocassionally    Allergies   Morphine and related; Prednisone; Tramadol; and Dicyclomine hcl  Review of Systems Review of Systems  Constitutional: Negative for fever.  HENT: Negative for ear pain, rhinorrhea and sneezing.   Respiratory: Negative for cough.   Skin: Positive for rash.  All other systems reviewed and are negative.  Physical Exam Updated Vital Signs BP 123/74 (BP Location: Left Arm)   Pulse (!) 54   Temp 97.5 F (36.4 C) (Oral)   Resp 18   SpO2 100%   Physical Exam  Constitutional: He appears well-developed and well-nourished. No distress.  HENT:  Head: Normocephalic and atraumatic.  Right Ear: Tympanic membrane, external ear and ear canal normal.  Left Ear: Tympanic membrane, external ear and ear canal normal.  Nose: Nose normal. No mucosal edema or rhinorrhea.  Mouth/Throat: Uvula is midline, oropharynx is clear and moist and mucous membranes are normal. No trismus in the jaw. No uvula swelling. No oropharyngeal exudate, posterior  oropharyngeal edema, posterior oropharyngeal erythema or tonsillar abscesses. No tonsillar exudate.  Eyes: Conjunctivae are normal.  Neck: Normal range of motion.  Cardiovascular: Normal rate, regular rhythm and normal heart sounds.   No murmur heard. Pulmonary/Chest: Effort normal.  Abdominal: He exhibits no distension.  Musculoskeletal: Normal range of motion.  Neurological: He is alert.  Several 1cm sized hyperpigmented macular lesions that are non-blanchable noted to BLE.   Skin: Rash noted. No pallor.  Psychiatric: He has a normal mood and affect. His behavior is normal.  Nursing note and vitals reviewed.  ED Treatments / Results  DIAGNOSTIC STUDIES: Oxygen Saturation is 100% on RA, normal by my interpretation.   COORDINATION OF CARE: 10:12 PM-Discussed next steps with pt. Pt verbalized understanding and is agreeable with the plan.   Labs (all labs ordered are listed, but only abnormal results are displayed) Labs Reviewed - No data to display  EKG  EKG Interpretation None      Radiology No results found.  Procedures Procedures   Medications Ordered  in ED Medications - No data to display  Initial Impression / Assessment and Plan / ED Course  I have reviewed the triage vital signs and the nursing notes.  Pertinent labs & imaging results that were available during my care of the patient were reviewed by me and considered in my medical decision making (see chart for details).     BP 123/74 (BP Location: Left Arm)   Pulse (!) 54   Temp 97.5 F (36.4 C) (Oral)   Resp 18   SpO2 100%    Final Clinical Impressions(s) / ED Diagnoses   Final diagnoses:  Skin irritation  Throat soreness   New Prescriptions New Prescriptions   No medications on file   I personally performed the services described in this documentation, which was scribed in my presence. The recorded information has been reviewed and is accurate.     10:20 PM Pt report of burning rash to  lower extremities for more than 1 month.  On exam, he has some small area of non blanchable skin changes (?vasculitis vs friction rub) doubt infection.  Doubt allergic reaction.  He has an appointment with dermatology in the next several days which I encourage f/u for further care.  He also report sore throat.  Throat exam is unremarkable.  No evidence of thrush or obvious infection.  No URI sxs.  No trouble swallowing.  Recommend gargle with salt water and f/u with PCP.     Fayrene Helper, PA-C 11/14/16 2223    Gwyneth Sprout, MD 11/14/16 [phone]

## 2016-11-14 NOTE — Discharge Instructions (Signed)
Please follow up with dermatologist as previously scheduled for further evaluation of your rash.  Gargle with salt water several times daily as it may help with your sore throat.

## 2016-11-18 ENCOUNTER — Ambulatory Visit: Payer: Self-pay | Admitting: Nurse Practitioner

## 2016-11-27 ENCOUNTER — Encounter: Payer: Self-pay | Admitting: Nurse Practitioner

## 2016-11-27 ENCOUNTER — Ambulatory Visit (INDEPENDENT_AMBULATORY_CARE_PROVIDER_SITE_OTHER): Payer: Self-pay | Admitting: Nurse Practitioner

## 2016-11-27 VITALS — BP 90/60 | HR 60 | Ht 74.0 in | Wt 162.0 lb

## 2016-11-27 DIAGNOSIS — K589 Irritable bowel syndrome without diarrhea: Secondary | ICD-10-CM

## 2016-11-27 MED ORDER — GLYCOPYRROLATE 2 MG PO TABS: 2.0000 mg | ORAL_TABLET | Freq: Two times a day (BID) | ORAL | 1 refills | Status: DC

## 2016-11-27 MED ORDER — PANTOPRAZOLE SODIUM 20 MG PO TBEC
20.0000 mg | DELAYED_RELEASE_TABLET | Freq: Two times a day (BID) | ORAL | 2 refills | Status: DC
Start: 2016-11-27 — End: 2016-12-19

## 2016-11-27 MED ORDER — PANTOPRAZOLE SODIUM 20 MG PO TBEC
20.0000 mg | DELAYED_RELEASE_TABLET | Freq: Two times a day (BID) | ORAL | 2 refills | Status: DC
Start: 2016-11-27 — End: 2016-11-27

## 2016-11-27 NOTE — Progress Notes (Signed)
     HPI: Patient is a 26 year old male known to Dr. Leone PayorGessner for history of irritable bowel syndrome. He is here today with several GI issues. He was doing well on Bentyl 20 mg bid but developed a skin rash on both legs. His Bentyl was subsequently decreased to 10mg  BID by ED or PCP. Following the dose reduction the rash improved but he then had a recurrence of cramps and bloody diarrhea. Describes stools a oily and frequent red.  Bleeding is different han hemorrhoidal bleeding. The hemorrhoidal bleeding is not associated with any pain and blood just drips into the toilet. He tried something for hemorrhoids but it apparently contained witch hazel which caused skin breakout so he is using cold compresses instead.  He has frequent nausea and has "stomach spasms" after eating. He was reccently seen in ED for LUQ pain. Workup, including CTscan was negative. developed GERD symptoms, seen in LUQ pain. CTscan and labs was negative   Past Medical History:  Diagnosis Date  . 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 ADD (attention deficit disorder)   . Allergy   . Anxiety   . Appendicitis with abscess 08/08/2014  . Bipolar disorder (HCC)   . Complication of anesthesia 06/28/2014   "HR dropped, couldn't get BP up when put to sleep for colonscopy"  . Family history of anesthesia complication    "grandmother had problems w/breathing" 06/29/2014)  . XLKGMWNU(272.5Headache(784.0)    "maybe monthly" (06/29/2014)  . Hemorrhoids, internal, with bleeding 03/29/2015  . IBS (irritable bowel syndrome)   . Oppositional defiant disorder, moderate 11/08/2013    Patient's surgical history, family medical history, social history, medications and allergies were all reviewed in Epic    Physical Exam: BP 90/60   Pulse 60   Ht 6\' 2"  (1.88 m)   Wt 162 lb (73.5 kg)   BMI 20.80 kg/m   GENERAL: thin anxious white male in NAD PSYCH: :Pleasant, cooperative, normal affect HEENT: Normocephalic, conjunctiva pink, mucous membranes moist, neck  supple without masses CARDIAC:  RRR, no murmur heard. No peripheral edema. PULM: Normal respiratory effort, lungs CTA bilaterally, no wheezing ABDOMEN:  soft, nontender, nondistended, no obvious masses, no hepatomegaly,  normal bowel sounds Rectal : no exterior lesion. He refused DRE SKIN:  turgor, no lesions seen Musculoskeletal:  Normal muscle tone, normal strength NEURO: Alert and oriented x 3, no focal neurologic deficits   ASSESSMENT and PLAN:  691. 26 year old with chronic IBS symptoms. Here with several GI issues. Bentyl dose reduced as it was felt to be contributing to or causing a rash.  -stop bentyl, trial of robinul forte.   2. Rectal bleeding. Trial of anusol cream. Probably hemorrhoidal but patient didn't want  a DRE. He had a colonoscopy in Sept 2016  3. Severe anxiety. He needs help with this as I feel it is driving a lot of his GI symptoms I have asked him to talk with PCP about a referral to psych / counselor. I am happy to see him back following that.   Willette ClusterPaula Jemiah Ellenburg , NP 11/27/2016, 3:04 PM

## 2016-11-27 NOTE — Patient Instructions (Addendum)
Stop the Meloxicam. Stop the Bentyl .- We added it to your allergy list.   We sent a prescription for Robinul Forte.  We also sent refills for Protonix 40 mg.   [facility], [address] [phone] Marcos EkeGreg Calone NP Alysia Pennaharlotte Nche NP   [place] [phone] ( New office)  [facility] [place] [phone]

## 2016-11-28 ENCOUNTER — Telehealth: Payer: Self-pay | Admitting: Nurse Practitioner

## 2016-11-28 ENCOUNTER — Other Ambulatory Visit: Payer: Self-pay

## 2016-11-28 MED ORDER — HYDROCORTISONE 2.5 % RE CREA: 1.0000 "application " | TOPICAL_CREAM | Freq: Every day | RECTAL | 0 refills | Status: DC

## 2016-11-28 NOTE — Telephone Encounter (Signed)
Called back. Got voicemail. DPR on file. Advised to check again with the pharmacy. Anusol Hc cream will be sent to the pharmacy. No anti anxiety medications will be prescribed from here, but should instead go to PCP or mental health provider.

## 2016-11-28 NOTE — Telephone Encounter (Signed)
Please advise 

## 2016-12-01 ENCOUNTER — Other Ambulatory Visit: Payer: Self-pay | Admitting: Internal Medicine

## 2016-12-01 ENCOUNTER — Telehealth: Payer: Self-pay | Admitting: Nurse Practitioner

## 2016-12-01 NOTE — Telephone Encounter (Signed)
No - but can schedule for a follow-up

## 2016-12-01 NOTE — Telephone Encounter (Signed)
Is it okay to schedule him for a hem band?

## 2016-12-02 NOTE — Telephone Encounter (Signed)
Spoke with the father of the patient. Patient is seeing some blood after the bowel movement but denies any blood with cleaning or any other time. There is no pain. Agrees to bring patient in to discuss with the understanding it is a discussion and not the procedure.

## 2016-12-05 ENCOUNTER — Telehealth: Payer: Self-pay | Admitting: Internal Medicine

## 2016-12-05 ENCOUNTER — Other Ambulatory Visit: Payer: Self-pay | Admitting: Gastroenterology

## 2016-12-05 MED ORDER — HYDROCORTISONE ACETATE 25 MG RE SUPP
25.0000 mg | Freq: Two times a day (BID) | RECTAL | 1 refills | Status: DC
Start: 2016-12-05 — End: 2017-04-22

## 2016-12-05 NOTE — Addendum Note (Signed)
Addended by: Annett FabianJONES, Vela Render L on: 12/05/2016 05:22 PM   Modules accepted: Orders

## 2016-12-05 NOTE — Telephone Encounter (Signed)
Patient's father is calling stating patient is having an increase in rectal bleeding.  He is asking what he can do.  His father is reassured that most likely from a hemorrhoidal source and that he should continue anusol cream.  He is advised that if he is having hard stools that he should add a stool softener.  He will keep the upcoming appt for 12/19/16 with Dr. Leone PayorGessner

## 2016-12-05 NOTE — Telephone Encounter (Signed)
Patient's father called in again this evening. Patient does not have insurance, can't afford Anusol. Recommended they try using OTC hydrocortizone cream and place this on OTC glycerin suppository and try this instead if they cannot afford the Anusol. They agreed.   Dahlia BailiffSheri FYI

## 2016-12-05 NOTE — Telephone Encounter (Signed)
Patient's father called back to request a refill of anusol suppositories.  Rx sent

## 2016-12-19 ENCOUNTER — Ambulatory Visit: Payer: Self-pay | Admitting: Internal Medicine

## 2016-12-19 ENCOUNTER — Encounter: Payer: Self-pay | Admitting: Internal Medicine

## 2016-12-19 ENCOUNTER — Ambulatory Visit (INDEPENDENT_AMBULATORY_CARE_PROVIDER_SITE_OTHER): Payer: Self-pay | Admitting: Internal Medicine

## 2016-12-19 DIAGNOSIS — F411 Generalized anxiety disorder: Secondary | ICD-10-CM

## 2016-12-19 DIAGNOSIS — K582 Mixed irritable bowel syndrome: Secondary | ICD-10-CM

## 2016-12-19 HISTORY — DX: Generalized anxiety disorder: F41.1

## 2016-12-19 MED ORDER — DULOXETINE HCL 30 MG PO CPEP: 30.0000 mg | ORAL_CAPSULE | Freq: Every day | ORAL | 0 refills | Status: DC

## 2016-12-19 MED ORDER — GLYCOPYRROLATE 2 MG PO TABS: 2.0000 mg | ORAL_TABLET | Freq: Two times a day (BID) | ORAL | 1 refills | Status: DC

## 2016-12-19 MED ORDER — DULOXETINE HCL 60 MG PO CPEP: 60.0000 mg | ORAL_CAPSULE | Freq: Every day | ORAL | 2 refills | Status: DC

## 2016-12-19 NOTE — Patient Instructions (Addendum)
  Stop your pantoprazole and bentyl.  Take 30mg  of duloxetine daily at supper for a month then bump it up to 60mg  daily.  We are giving you a rx for the 60mg  today to take to the pharmacy when you need it. The 30mg  was sent in today along with the glycopyolate.   Use benefiber daily, we are giving you a handout today.   We want to see you in a couple of months.    GOOD LUCK WITH THE NEW JOB!   I appreciate the opportunity to care for you. Stan Headarl Gessner, MD, [facility]

## 2016-12-19 NOTE — Assessment & Plan Note (Signed)
Try duloxetine

## 2016-12-19 NOTE — Progress Notes (Signed)
   Blake Bradley 25 y.o. 03/13/1991 [id number]  Assessment & Plan:   Encounter Diagnoses  Name Primary?  . Irritable bowel syndrome with both constipation and diarrhea   . Generalized anxiety disorder      benefiber daily - not a good banding candidate as spasm and sensitivity on rectal exam and small hemorrhoids Start Glycopyrrolate, stop dicyclomine, stop pantoprazole do not think he has acid-related issues duoloxetine 30 mg qd x 1 month then 60 mg qd - should help anxiety and IBS rtc 2 mos  Subjective:   Chief Complaint: rectal bleeding  HPI Still having slight rectal bleeding and irregular defecation Also post-prandial abdominal pain - dicyclomine helps but causes a rash Went to pharmacy and they said not to take glycopyrrolate as Rxed by Willette ClusterPaula Guenther as same as pantoprazole??  Cannot tell if pantoprazole helps  About to start a job - [employer] Medications, allergies, past medical history, past surgical history, family history and social history are reviewed and updated in the EMR.  Review of Systems Still anxious Not suicidal or homicidal  Objective:   Physical Exam BP 110/60   Ht 6\' 1"  (1.854 m)   Wt 161 lb 8 oz (73.3 kg)   BMI 21.31 kg/m  NAD Mildly anxious  Rectal non-tender but some spasm  Anoscopy small Gr 1 internal hemorrhoids no fissure

## 2016-12-19 NOTE — Assessment & Plan Note (Signed)
Try glycopyrrolate

## 2016-12-20 ENCOUNTER — Encounter: Payer: Self-pay | Admitting: Internal Medicine

## 2016-12-22 IMAGING — DX DG ABDOMEN ACUTE W/ 1V CHEST
3 series · 3 of 3 positions shown · non-contrast
Comparison: CT 01/18/2015 and earlier studies

CLINICAL DATA: abd pain since [REDACTED]. Pt c/o n/v no diarrhea. LBM
this morning. Blood present. Pt had endoscopy on [REDACTED]. Appendix
removed in [REDACTED] and has had complications since. Vitals stable.

EXAM:
DG ABDOMEN ACUTE W/ 1V CHEST

[chest pa]
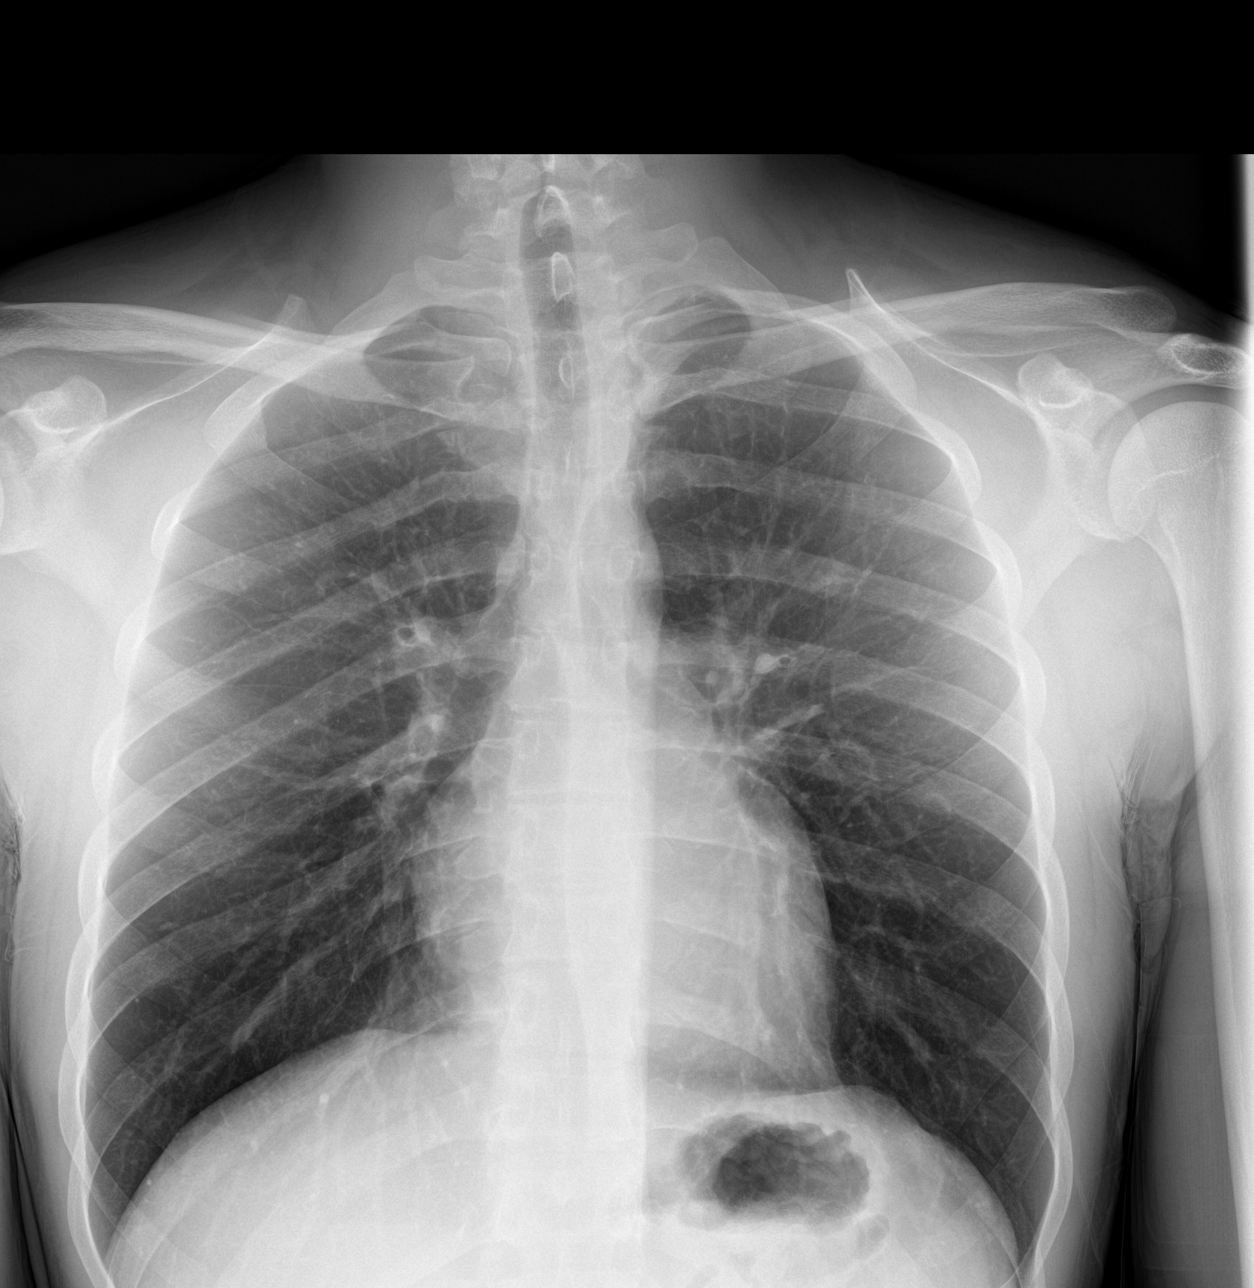

[abdomen erect]
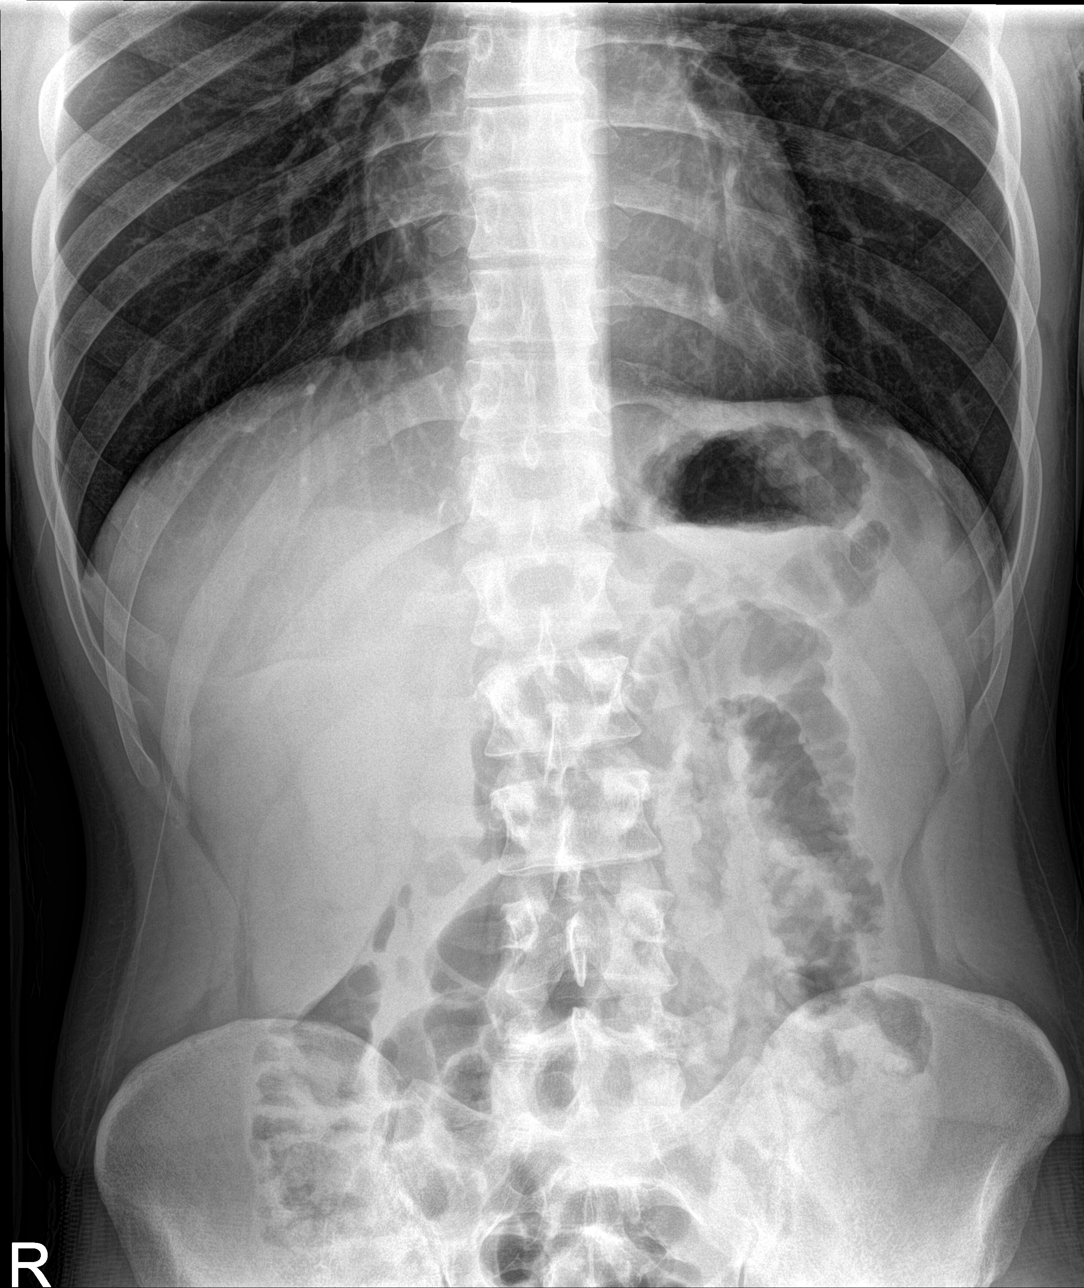

[abdomen supine]
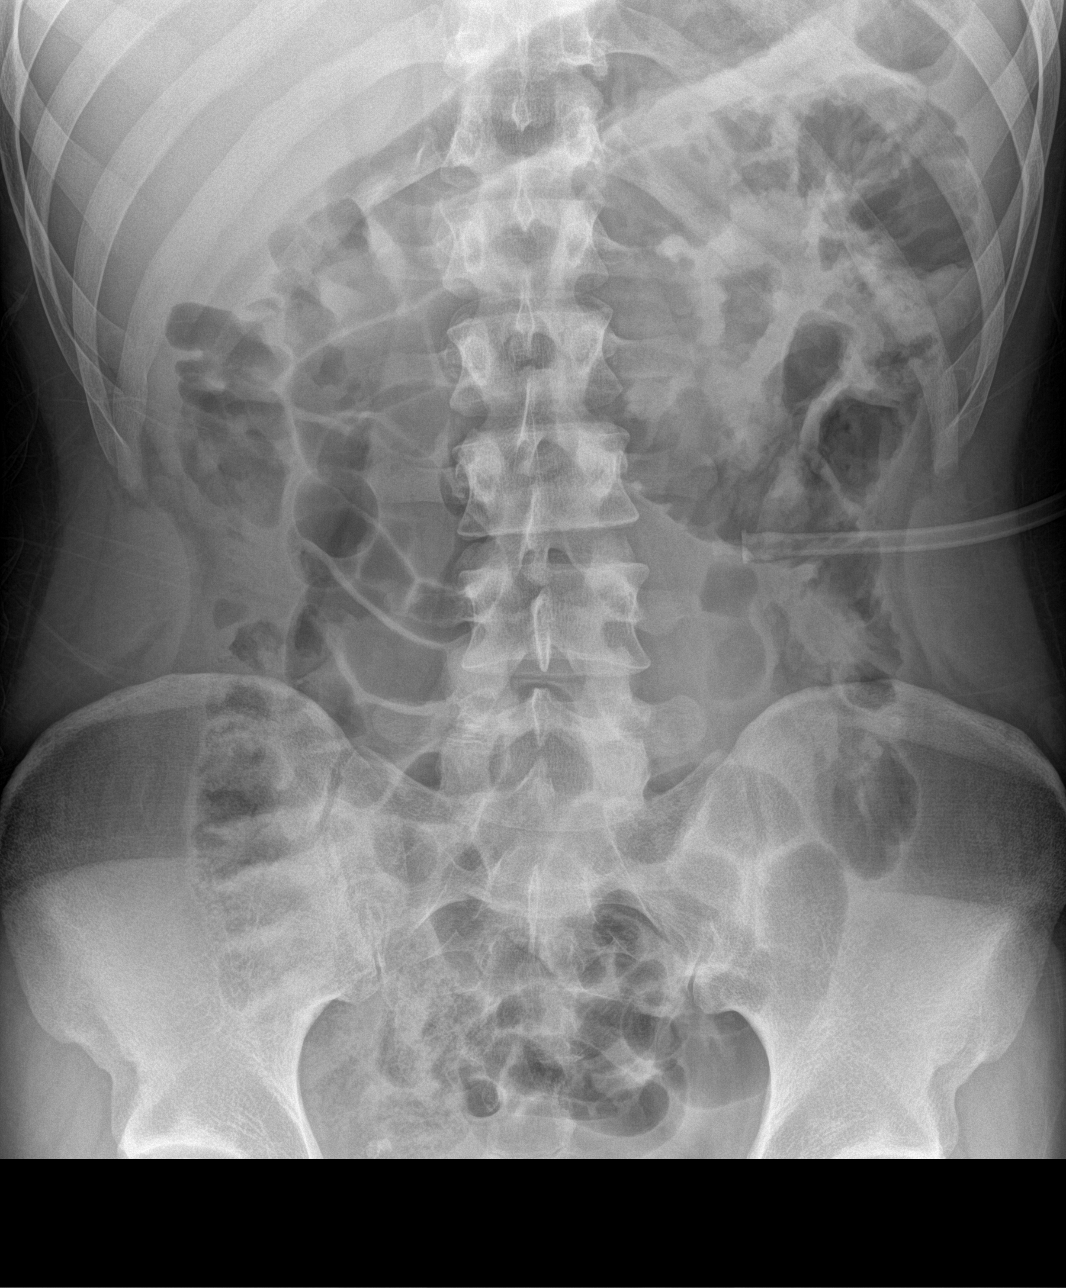

[3 of 3 positions shown; findings below may reference images not displayed]

FINDINGS: Heart size and mediastinal contours are within normal limits.

Lungs are clear. Mild central peribronchial thickening. No effusion.

Gas throughout multiple nondilated small bowel loops in the mid
abdomen. Colon and rectum are decompressed. No significant fluid
levels on the erect film.

Stable right pelvic phleboliths.

Regional bones unremarkable.
IMPRESSION: No acute cardiopulmonary disease.

Nonobstructive bowel gas pattern.  No free air.

## 2016-12-22 IMAGING — CT CT ABD-PELV W/ CM
2 of 4 series · 16 of 46 positions shown, 18 images · IV contrast (APPLIED)
Comparison: Radiographs dated 04/15/2015 and CT scan dated
01/18/2015

CLINICAL DATA: Abdominal pain.  Bloody stool.

EXAM:
CT ABDOMEN AND PELVIS WITH CONTRAST
TECHNIQUE: Multidetector CT imaging of the abdomen and pelvis was performed
using the standard protocol following bolus administration of
intravenous contrast.
CONTRAST:  100mL OMNIPAQUE IOHEXOL 300 MG/ML  SOLN

[Series 2: abd/ pelvis 5.0 i30f 1 · axial · 0.68mm/px · z∈[-1157,-722]mm · 13 of 95 slices shown, 15 images]
[im 4/95  soft-tissue]
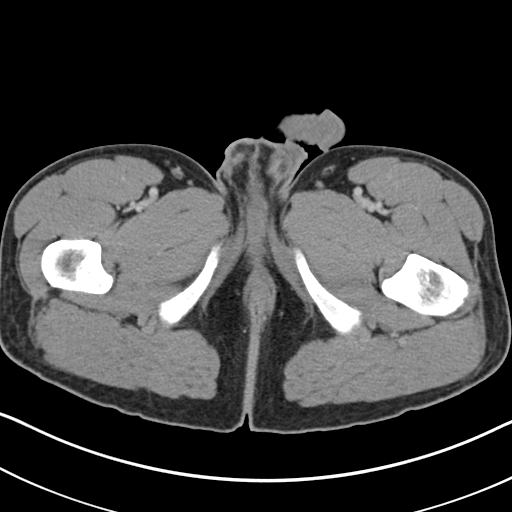
[im 4/95  bone]
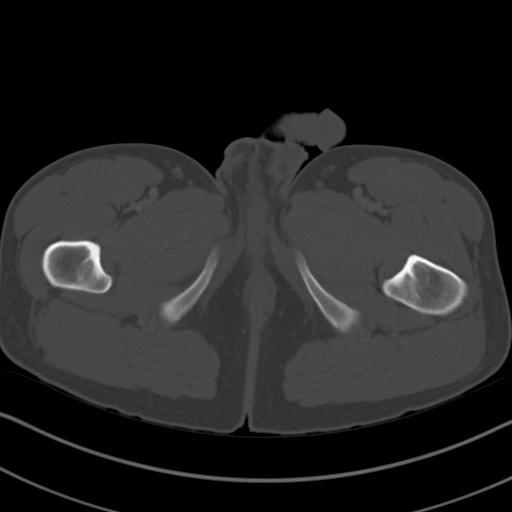
[im 12/95  soft-tissue]
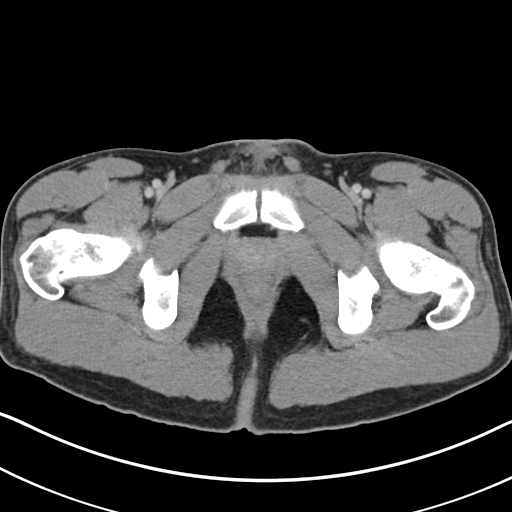
[im 19/95  soft-tissue]
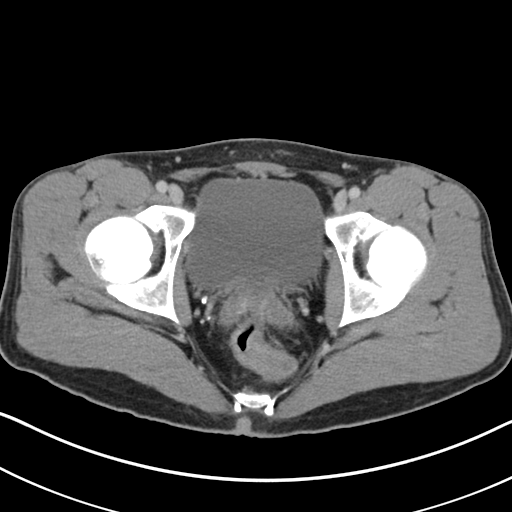
[im 27/95  soft-tissue]
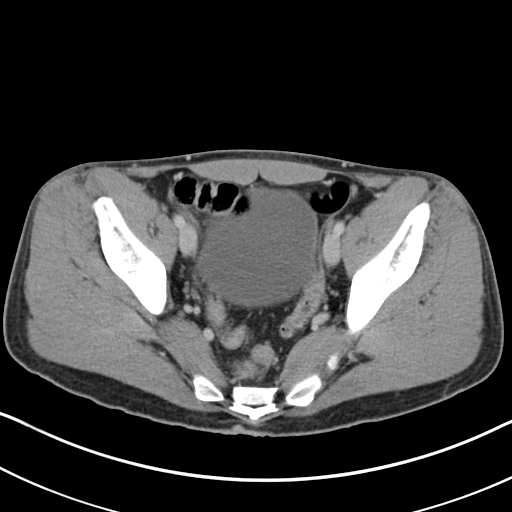
[im 34/95  soft-tissue]
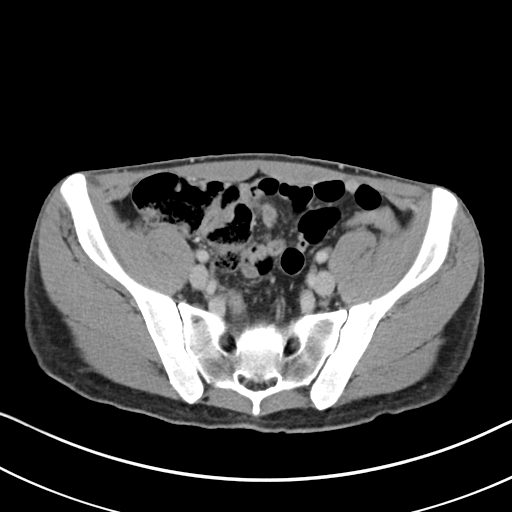
[im 42/95  soft-tissue]
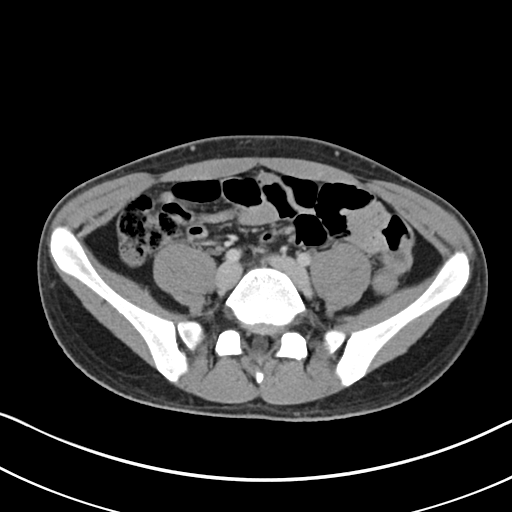
[im 49/95  soft-tissue]
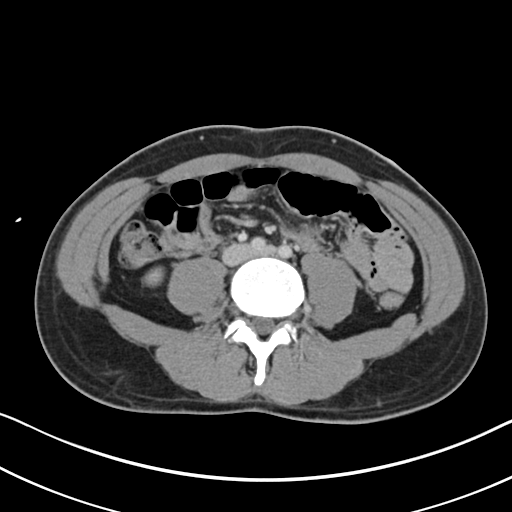
[im 53/95  soft-tissue]
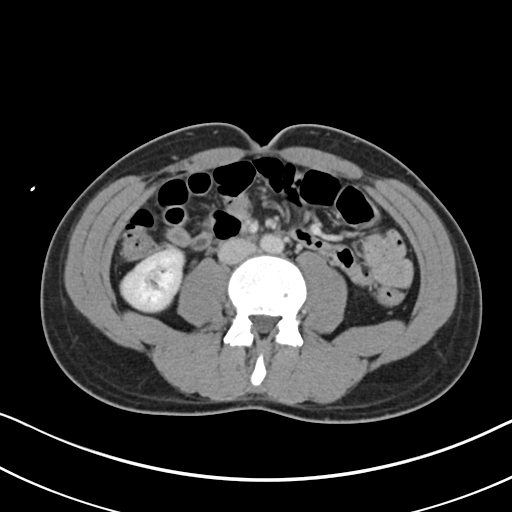
[im 61/95  soft-tissue]
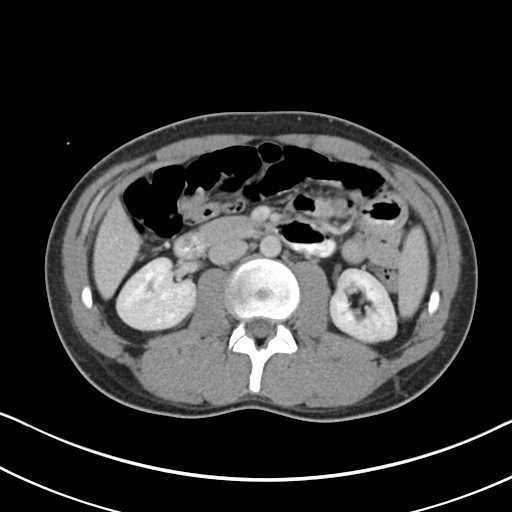
[im 61/95  bone]
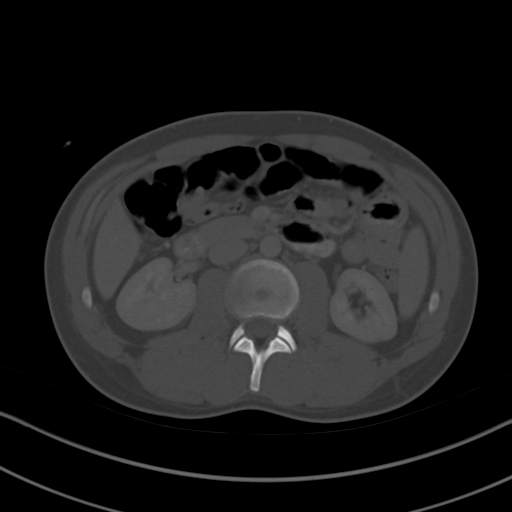
[im 68/95  soft-tissue]
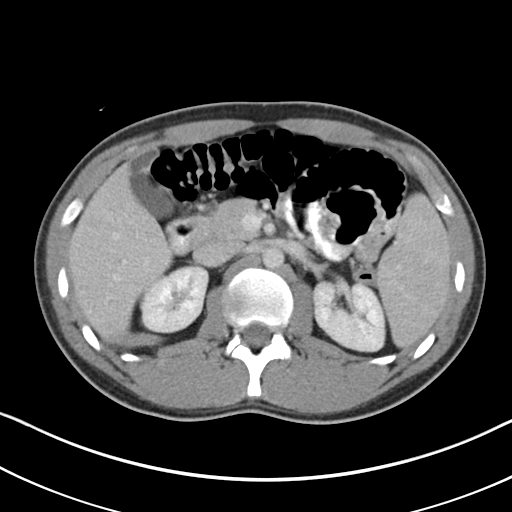
[im 76/95  soft-tissue]
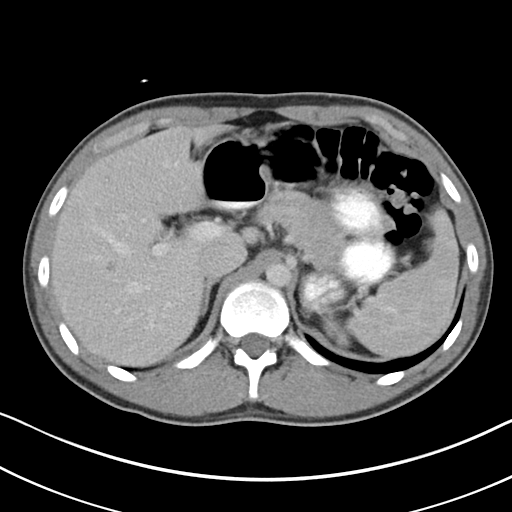
[im 83/95  soft-tissue]
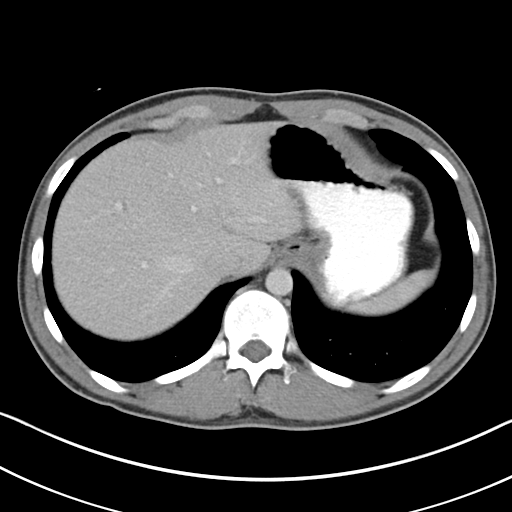
[im 91/95  soft-tissue]
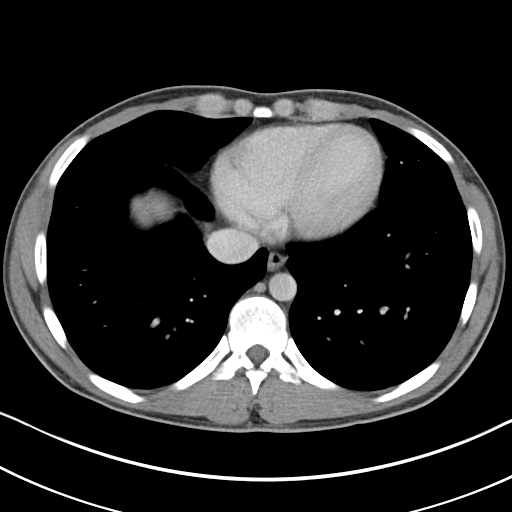

[Series 5: coronal soft tissue · coronal · 0.73mm/px · 3 of 76 slices shown]
[im 26/76  soft-tissue]
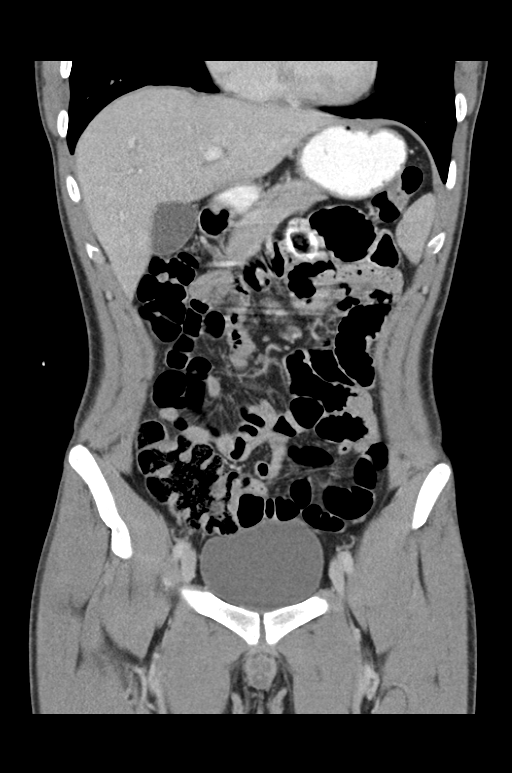
[im 34/76  soft-tissue]
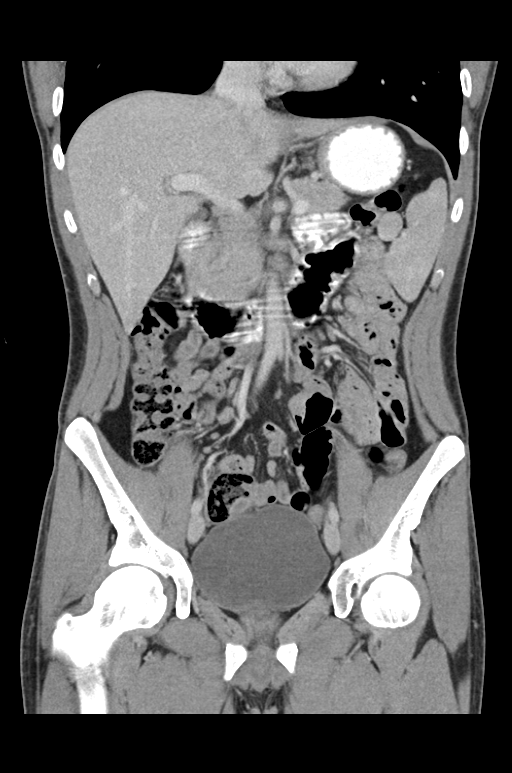
[im 42/76  soft-tissue]
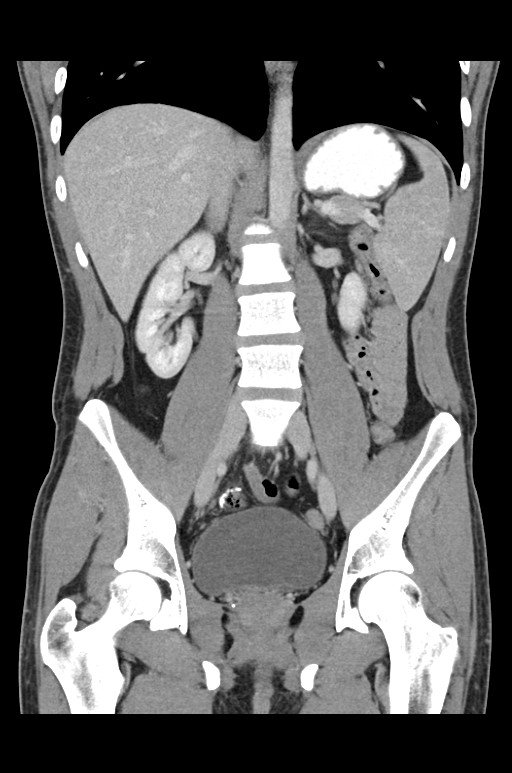

[16 of 46 positions shown; findings below may reference images not displayed]

FINDINGS: Lower chest:  Normal.

Hepatobiliary: Normal.

Pancreas: Normal.

Spleen: Normal.

Adrenals/Urinary Tract: Normal.

Stomach/Bowel: Surgical staples at the cecum from previous
appendectomy. Small bowel is normal including the terminal ileum.
There is a tiny area of what appears to be submucosal fluid or edema
in the rectum best seen on image 65 of series 5, 13 mm. This could
represent a tiny submucosal abscess. This is not definitive. The
bowel otherwise appears normal.

Vascular/Lymphatic: Normal.

Reproductive: Normal.

Other: No free air or free fluid.

Musculoskeletal: Normal.
IMPRESSION: 13 mm area of what appears to be focal submucosal edema or fluid in
the rectum which could represent a small submucosal abscess. This is
not definitive. Otherwise, normal CT scan of the abdomen and pelvis.

## 2016-12-23 ENCOUNTER — Telehealth: Payer: Self-pay | Admitting: Internal Medicine

## 2016-12-23 NOTE — Telephone Encounter (Signed)
Please advise Sir, thank you. 

## 2016-12-24 MED ORDER — HYDROCORTISONE 2.5 % RE CREA: 1.0000 "application " | TOPICAL_CREAM | Freq: Every day | RECTAL | 1 refills | Status: DC

## 2016-12-24 NOTE — Telephone Encounter (Signed)
Spoke with Blake Bradley  ( Dad) and he said they are talking about the hydrocortisone rectal cream 2.5%.  They want to know if it comes in a stronger strength and also wants a refill Sir.

## 2016-12-24 NOTE — Telephone Encounter (Signed)
Left a detailed message on Blake Bradley's (Dad) mobile # about what Dr Marvell FullerGessner's response was.  I refilled the hydrocortisone rectal cream as requested.  Told him to call back with any further questions.

## 2016-12-24 NOTE — Telephone Encounter (Signed)
No stronger  Refill x 2

## 2016-12-24 NOTE — Telephone Encounter (Signed)
Please clarify exactly what cream he is referring to

## 2017-02-11 ENCOUNTER — Other Ambulatory Visit: Payer: Self-pay | Admitting: Internal Medicine

## 2017-02-12 ENCOUNTER — Telehealth: Payer: Self-pay | Admitting: Internal Medicine

## 2017-02-12 NOTE — Telephone Encounter (Signed)
Dr. Gessner FYI 

## 2017-02-12 NOTE — Telephone Encounter (Signed)
Refilled as directed.  

## 2017-02-12 NOTE — Telephone Encounter (Signed)
Please advise Sir? 

## 2017-02-12 NOTE — Telephone Encounter (Signed)
Refill x 2 

## 2017-02-13 NOTE — Telephone Encounter (Signed)
Blake BumpsJessica can I refill the bentyl for this pt?

## 2017-02-13 NOTE — Telephone Encounter (Signed)
I don't think that I have ever seen this patient, but I do not see any reason that the Bentyl could not be refilled.  So go ahead.  Thank you,    Jess

## 2017-02-16 ENCOUNTER — Other Ambulatory Visit: Payer: Self-pay | Admitting: Internal Medicine

## 2017-02-16 MED ORDER — DICYCLOMINE HCL 20 MG PO TABS: 20.0000 mg | ORAL_TABLET | Freq: Three times a day (TID) | ORAL | 0 refills | Status: DC

## 2017-02-16 NOTE — Telephone Encounter (Signed)
Left message for patient to call back  

## 2017-02-16 NOTE — Telephone Encounter (Signed)
I switched him to glycopyrrolate because he thought dicyclomine caused a rash  If he wants to retry dicyclomine ok  If he does not have a f/u appt I think he would benefit from one in May or June

## 2017-02-16 NOTE — Telephone Encounter (Signed)
Dr. Leone PayorGessner your last note you wanted him to come off the dicyclomine.  OK to refill?

## 2017-02-16 NOTE — Telephone Encounter (Signed)
Father notified of the response.  He thinks that the rash was from laundry detergent.  He will keep the follow up that was scheduled for 03/06/17

## 2017-03-06 ENCOUNTER — Encounter: Payer: Self-pay | Admitting: Internal Medicine

## 2017-03-06 ENCOUNTER — Ambulatory Visit (INDEPENDENT_AMBULATORY_CARE_PROVIDER_SITE_OTHER): Payer: Self-pay | Admitting: Internal Medicine

## 2017-03-06 VITALS — BP 80/50 | HR 72 | Ht 72.0 in | Wt 160.1 lb

## 2017-03-06 DIAGNOSIS — K649 Unspecified hemorrhoids: Secondary | ICD-10-CM

## 2017-03-06 DIAGNOSIS — K580 Irritable bowel syndrome with diarrhea: Secondary | ICD-10-CM

## 2017-03-06 DIAGNOSIS — F419 Anxiety disorder, unspecified: Secondary | ICD-10-CM

## 2017-03-06 MED ORDER — DILTIAZEM GEL 2 %: 1.0000 "application " | Freq: Two times a day (BID) | CUTANEOUS | 3 refills | Status: DC

## 2017-03-06 MED ORDER — BUSPIRONE HCL 10 MG PO TABS: 10.0000 mg | ORAL_TABLET | Freq: Three times a day (TID) | ORAL | 2 refills | Status: DC

## 2017-03-06 MED ORDER — GLYCOPYRROLATE 2 MG PO TABS: 2.0000 mg | ORAL_TABLET | Freq: Three times a day (TID) | ORAL | 2 refills | Status: DC

## 2017-03-06 NOTE — Patient Instructions (Addendum)
If you are age 24 or older, your body mass index should be between 23-30. Your Body mass index is 21.72 kg/m. If this is out of the aforementioned range listed, please consider follow up with your Primary Care Provider.  If you are age 24 or younger, your body mass index should be between 19-25. Your Body mass index is 21.72 kg/m. If this is out of the aformentioned range listed, please consider follow up with your Primary Care Provider.   You have been given samples of IBgard and a coupon.  Dr. Leone PayorGessner has prescribed Robinul and Diltiazem gel for you. Written prescriptions were given today at your visit.  We have sent the following medications to your pharmacy for you to pick up at your convenience:  Buspirone  Please follow up with Dr. Leone PayorGessner in 2 months on July 30th at 3:45pm.  Thank you

## 2017-03-06 NOTE — Progress Notes (Signed)
   Blake Bradley 25 y.o. 07/06/1991 [id number]  Assessment & Plan:   Encounter Diagnoses  Name Primary?  . Irritable bowel syndrome with diarrhea Yes  . Anxiety disorder, unspecified type   . Bleeding hemorrhoids      Things some pretty stable. Overall compared to where he was a few years ago he is better. We'll try buspirone 10 mg nightly and wrapping up to 3 times a day for his anxiety. Diltiazem gel for the hemorrhoidal bleeding and anal spasm that I've detected in the past. Can continue his hemorrhoidal cream as well. When he gets insurance consider something like Viberzi. He will see me in about 2 months. He will also try glycopyrrolate 3 times a day versus IBD R 3 times a day versus both.   Subjective:   Chief Complaint: Irritable bowel syndrome anxiety and rectal bleeding  HPI  Blake Bradley returns after being seen in March, I started dicyclomine for anxiety but he had intense nausea with that even after 3-1/2 weeks it didn't dissipate so he quit. Glycopyrrolate helps her about 2 hours and then he'll get cramps. He is taking it twice a day. Still has intermittent loose stools and diarrhea at times. It does not disturb his sleep. He has intermittent rectal bleeding as before. 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 Medications, allergies, past medical history, past surgical history, family history and social history are reviewed and updated in the EMR.  Review of Systems As above  Objective:   Physical Exam BP (!) 80/50 (BP Location: Left Arm, Patient Position: Sitting, Cuff Size: Normal)   Pulse 72   Ht 6' (1.829 m) Comment: height measured without shoes  Wt 160 lb 2 oz (72.6 kg)   BMI 21.72 kg/m  No acute distress He is an anxious young white man The eyes are anicteric The abdomen is soft nontender and benign  .15  minutes time spent with patient > half in counseling coordination of care

## 2017-03-17 ENCOUNTER — Telehealth: Payer: Self-pay | Admitting: Internal Medicine

## 2017-03-18 NOTE — Telephone Encounter (Signed)
Patients father notified.

## 2017-03-18 NOTE — Telephone Encounter (Signed)
That is not typical for Buspirone - Hopefully will improve with time  Would be best for Hriday to let us know himself

## 2017-04-02 ENCOUNTER — Telehealth: Payer: Self-pay | Admitting: Internal Medicine

## 2017-04-02 NOTE — Telephone Encounter (Signed)
Patient's father notified that per Dr. Leone PayorGessner no changes in treatment unless patient calls himself.  See previous phone notes. He will ask his son to call.

## 2017-04-03 ENCOUNTER — Telehealth: Payer: Self-pay | Admitting: Internal Medicine

## 2017-04-03 NOTE — Telephone Encounter (Signed)
Patient reports abdominal pain and rectal bleeding.  He has been using the rectal cream, but he feels that the bleeding is coming from the inside with blood on the stool and sharp pain with BM.  He has urgency after eating.  He has been also c/o sharp pains when drinking water.  He does not have follow up until the end of July please advise

## 2017-04-03 NOTE — Telephone Encounter (Signed)
Left message for patient to call back  

## 2017-04-06 MED ORDER — AMBULATORY NON FORMULARY MEDICATION: 3 refills | Status: DC

## 2017-04-06 NOTE — Telephone Encounter (Signed)
Please clarify - is he using hydrocortisone cream, diltiazem gel or both?  He will need a non-urgent f/u appointment if not arranged yet

## 2017-04-06 NOTE — Telephone Encounter (Signed)
Patient notified  He will keep follow up as scheduled He is notified to [facility] to [facility] RX

## 2017-04-06 NOTE — Telephone Encounter (Signed)
Have him do the following  1) IB Gard 2 30 mins before meals 2) Rx diltiazem 2% with lidocaine 5% cream insert into rectum with finger tid (morning and bedtime and after bowel movements) # 30 g and refill x 3 3) Next available office visit me

## 2017-04-06 NOTE — Telephone Encounter (Signed)
Just the hydrocortisone cream and suppositories. He wanted you to know he has sharp pain in his upper abdomen with BM

## 2017-04-14 ENCOUNTER — Other Ambulatory Visit: Payer: Self-pay | Admitting: Internal Medicine

## 2017-04-14 NOTE — Telephone Encounter (Signed)
Please advise Sir? Thank you. 

## 2017-04-16 NOTE — Telephone Encounter (Signed)
Left message on the home # voice mail to call me back.  Tried the work # and it said [employer] customer not available.

## 2017-04-16 NOTE — Telephone Encounter (Signed)
Please find out: 1) Is he using the diltiazem rectal Tx? 2) How often is he bleeding 3) Explain that continued use of this medication may cause deterioration of the anal and rectal tissues so I am reluctant to prescribe again 4) If we need to squeeze him into schedule next week - currently has 7/30 appt

## 2017-04-17 NOTE — Telephone Encounter (Signed)
I've sent him a Mychart message to call me.  I left him a message on his [employer] work # to call me as well.

## 2017-04-17 NOTE — Telephone Encounter (Signed)
Sem called me back and reports he is having bleeding 3-4 times a day.  Has not been using any diltiazem, doesn't have this rx. I'm moving his appointment up to 04/24/17 at 9:15AM.  Please advise if needs to do anything between now and then Sir, thank you.

## 2017-04-20 ENCOUNTER — Telehealth: Payer: Self-pay

## 2017-04-20 NOTE — Telephone Encounter (Signed)
Need to Rx diltiazem and lidocaine and he needs to use it 2-3 x a day

## 2017-04-20 NOTE — Telephone Encounter (Signed)
Left Amori a detailed message to call me and let me know which compound pharmacy to send this to or if he wants to wait until he see's Dr Leone PayorGessner on Wednesday.

## 2017-04-20 NOTE — Telephone Encounter (Signed)
His appointment has been moved up to 04/22/17.

## 2017-04-20 NOTE — Telephone Encounter (Signed)
R/S'ed his appointment to 04/22/17 at 9:15AM due to his work schedule.

## 2017-04-20 NOTE — Telephone Encounter (Signed)
-----   Message from Holli Humbleshristy D Harris sent at 04/20/2017 10:48 AM EDT ----- Pt asking to speak with PJ about his appt Friday.  He said you worked him in per Dr. Leone PayorGessner and now his job is making him work [phone]

## 2017-04-22 ENCOUNTER — Ambulatory Visit (INDEPENDENT_AMBULATORY_CARE_PROVIDER_SITE_OTHER): Payer: Self-pay | Admitting: Internal Medicine

## 2017-04-22 ENCOUNTER — Encounter: Payer: Self-pay | Admitting: Internal Medicine

## 2017-04-22 VITALS — BP 100/68 | HR 56 | Ht 72.0 in | Wt 160.0 lb

## 2017-04-22 DIAGNOSIS — L309 Dermatitis, unspecified: Secondary | ICD-10-CM

## 2017-04-22 DIAGNOSIS — F411 Generalized anxiety disorder: Secondary | ICD-10-CM

## 2017-04-22 DIAGNOSIS — K582 Mixed irritable bowel syndrome: Secondary | ICD-10-CM

## 2017-04-22 DIAGNOSIS — K644 Residual hemorrhoidal skin tags: Secondary | ICD-10-CM

## 2017-04-22 DIAGNOSIS — K594 Anal spasm: Secondary | ICD-10-CM

## 2017-04-22 MED ORDER — AMBULATORY NON FORMULARY MEDICATION: 3 refills | Status: DC

## 2017-04-22 MED ORDER — NYSTATIN-TRIAMCINOLONE 100000-0.1 UNIT/GM-% EX OINT: 1.0000 "application " | TOPICAL_OINTMENT | Freq: Two times a day (BID) | CUTANEOUS | 1 refills | Status: DC

## 2017-04-22 NOTE — Telephone Encounter (Signed)
Blake Bradley came in to see Dr Leone PayorGessner today and meds were rx'ed.  We will deny the procto cream.

## 2017-04-22 NOTE — Progress Notes (Signed)
Blake Bradley 25 y.o. [birth year] [id number]  Assessment & Plan:   Encounter Diagnoses  Name Primary?  . Bleeding external hemorrhoids Yes  . Anal spasm   . Irritable bowel syndrome with both constipation and diarrhea   . Perianal dermatitis   . Generalized anxiety disorder    Is from external hemorrhoids. He has severe anal spasm but no fissure in the background of irritable bowel syndrome. Therapeutic plan is as follows:  Benefiber 1 tbsp qhs Diltiazem/lidocaine cream intra-anal twice a day to 3 times a day for anal spasm Avoid continued rectal steroid use if at all possible, he is not a good banding candidate with all the anal spasm Nystatin triamcinolone ointment for perianal dermatitis Psychiatry referral and psychology referral for his anxiety which is a big part of his presentation I have explained that is in appropriate for him to use his mother's alprazolam.  I appreciate the opportunity to care for this patient.    Subjective:   Chief Complaint:Rectal bleeding, anxiety  HPI Blake Bradley continues to have rectal bleeding. He was asking for a refill of hydrocortisone cream for the rectum. He's been on that off and on for years. He continues to have red bleeding with bowel movements. He denies any significant rectal or anal pain but has some left upper quadrant pain when he defecates. I'll movements are all over the map in consistency. He is not on a fiber supplement. I had tried to help him with his anxiety issues using buspirone and duloxetine but side effect issues with sleepiness and night sweats led to discontinuation. He is asking for help with his anxiety. In the mornings before he goes to work he takes a half of his mother's alprazolam. Allergies  Allergen Reactions  . Morphine And Related Hives  . Prednisone Hives  . Tramadol Other (See Comments)    hallucinations   . Bentyl [Dicyclomine Hcl] Rash   Current Meds  Medication Sig  . dicyclomine (BENTYL) 20 MG  tablet Take 1 tablet (20 mg total) by mouth 3 (three) times daily before meals.  . [DISCONTINUED] hydrocortisone (ANUSOL-HC) 25 MG suppository Place 1 suppository (25 mg total) rectally 2 (two) times daily. (Patient taking differently: Place 25 mg rectally 2 (two) times daily. )  . [DISCONTINUED] PROCTO-MED HC 2.5 % rectal cream APPLY TOPICALLY RECTALLY AT BEDTIME   Past Medical History:  Diagnosis Date  . 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 ADD (attention deficit disorder)   . Allergy   . Anxiety   . Appendicitis with abscess 08/08/2014  . Bipolar disorder (HCC)   . Complication of anesthesia 06/28/2014   "HR dropped, couldn't get BP up when put to sleep for colonscopy"  . Family history of anesthesia complication    "grandmother had problems w/breathing" 06/29/2014)  . Generalized anxiety disorder 12/19/2016  . WUJWJXBJ(478.2)    "maybe monthly" (06/29/2014)  . Hemorrhoids, internal, with bleeding 03/29/2015  . IBS (irritable bowel syndrome)   . Oppositional defiant disorder, moderate 11/08/2013   Past Surgical History:  Procedure Laterality Date  . capsule endoscopy  04/13/2015  . COLONOSCOPY WITH PROPOFOL N/A 06/28/2014   Procedure: COLONOSCOPY WITH PROPOFOL;  Surgeon: Iva Boop, MD;  Location: [facility] ENDOSCOPY;  Service: Endoscopy;  Laterality: N/A;  . 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 LAPAROSCOPIC APPENDECTOMY N/A 08/08/2014   Procedure: LAPAROSCOPIC APPENDECTOMY;  Surgeon: Manus Rudd, MD;  Location: [facility] OR;  Service: General;  Laterality: N/A;   Social History   Social  History  . Marital status: Single    Spouse name: N/A  . Number of children: N/A  . Years of education: N/A   Occupational History  . Student    Social History Main Topics  . Smoking status: Never Smoker  . Smokeless tobacco: Never Used  . Alcohol use Yes     Comment: ocassionally  . Drug use: No   Social History Narrative   Single. Lives with his parents. One caffeinated beverage a day. High  school graduate, has attended community college   As of 2018 working at [employer]   family history includes Breast cancer in his mother; Ulcerative colitis in his mother.  Review of Systems As per history of present illness  Objective:   Physical Exam BP 100/68 (BP Location: Left Arm, Patient Position: Sitting, Cuff Size: Normal)   Pulse (!) 56   Ht 6' (1.829 m)   Wt 160 lb (72.6 kg)   BMI 21.70 kg/m  Thin young white man in no acute distress Eyes anicteric Abdomen is soft nontender Mildly anxious appropriate mood and affect otherwise  Rectal exam reveals a normal anoderm except for perianal dermatitis is noted mild. Anal tone is increased. Not significantly tender perhaps mildly no mass brown stool in the rectal vault.  No signs of a fissure on exam. Anoscopy is also perform the demonstrates grade 1 inflamed external hemorrhoids in all positions. No fissure.

## 2017-04-22 NOTE — Patient Instructions (Addendum)
   Take the rectal cream prescription to [facility] - get it filled and use it.  Pick[facility] the [facility].  Today we are giving you a handout on benefiber to read and follow.  I appreciate the opportunity to care for you. Iva Booparl E. Leotha Westermeyer, MD, Clementeen GrahamFACG

## 2017-04-24 ENCOUNTER — Encounter: Payer: Self-pay | Admitting: Internal Medicine

## 2017-04-24 ENCOUNTER — Ambulatory Visit: Payer: Self-pay | Admitting: Internal Medicine

## 2017-05-11 ENCOUNTER — Ambulatory Visit: Payer: Self-pay | Admitting: Internal Medicine

## 2017-05-26 ENCOUNTER — Telehealth: Payer: Self-pay | Admitting: Internal Medicine

## 2017-05-26 MED ORDER — DICYCLOMINE HCL 20 MG PO TABS: 20.0000 mg | ORAL_TABLET | Freq: Four times a day (QID) | ORAL | 3 refills | Status: DC | PRN

## 2017-05-26 NOTE — Telephone Encounter (Signed)
Patient states that you were going to refer him to someone for his anxiety.  I do not see any reference to this in his chart.  Please advise

## 2017-05-26 NOTE — Telephone Encounter (Signed)
I am still working on trying to find him someone and will work on that this week. Dicyclomine 20 mg every 6 hours when necessary #90 with 3 refills is okay

## 2017-05-26 NOTE — Telephone Encounter (Signed)
I left a message that the refill has been sent and that Dr. Leone PayorGessner is still working on referral

## 2017-05-31 ENCOUNTER — Emergency Department (HOSPITAL_COMMUNITY)
Admission: EM | Admit: 2017-05-31 | Discharge: 2017-05-31 | Disposition: A | Discharge status: Home / Self Care | Payer: No Typology Code available for payment source | Attending: Emergency Medicine | Admitting: Emergency Medicine

## 2017-05-31 ENCOUNTER — Encounter (HOSPITAL_COMMUNITY): Payer: Self-pay | Admitting: Emergency Medicine

## 2017-05-31 DIAGNOSIS — R42 Dizziness and giddiness: Secondary | ICD-10-CM | POA: Diagnosis present

## 2017-05-31 DIAGNOSIS — Z5321 Procedure and treatment not carried out due to patient leaving prior to being seen by health care provider: Secondary | ICD-10-CM | POA: Diagnosis not present

## 2017-05-31 LAB — CBC
HCT: 45.6 % (ref 39.0–52.0)
HEMOGLOBIN: 16.2 g/dL (ref 13.0–17.0)
MCH: 32.4 pg (ref 26.0–34.0)
MCHC: 35.5 g/dL (ref 30.0–36.0)
MCV: 91.2 fL (ref 78.0–100.0)
Platelets: 271 10*3/uL (ref 150–400)
RBC: 5 MIL/uL (ref 4.22–5.81)
RDW: 13.2 % (ref 11.5–15.5)
WBC: 7.7 10*3/uL (ref 4.0–10.5)

## 2017-05-31 LAB — BASIC METABOLIC PANEL
ANION GAP: 10 (ref 5–15)
BUN: 15 mg/dL (ref 6–20)
CO2: 25 mmol/L (ref 22–32)
Calcium: 9.3 mg/dL (ref 8.9–10.3)
Chloride: 104 mmol/L (ref 101–111)
Creatinine, Ser: 1.17 mg/dL (ref 0.61–1.24)
Glucose, Bld: 91 mg/dL (ref 65–99)
POTASSIUM: 3.7 mmol/L (ref 3.5–5.1)
SODIUM: 139 mmol/L (ref 135–145)
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
## 2017-06-04 ENCOUNTER — Encounter: Payer: Self-pay | Admitting: Internal Medicine

## 2017-06-04 ENCOUNTER — Telehealth: Payer: Self-pay | Admitting: Internal Medicine

## 2017-06-04 DIAGNOSIS — F411 Generalized anxiety disorder: Secondary | ICD-10-CM
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
## 2017-06-04 NOTE — Telephone Encounter (Signed)
I left a message for behavioral health to find out the process for referral.

## 2017-06-05 NOTE — Telephone Encounter (Signed)
Behavioral health is closed today.  I will check back on Monday

## 2017-06-07 ENCOUNTER — Encounter (HOSPITAL_COMMUNITY): Payer: Self-pay | Admitting: Emergency Medicine

## 2017-06-07 ENCOUNTER — Emergency Department (HOSPITAL_COMMUNITY)
Admission: EM | Admit: 2017-06-07 | Discharge: 2017-06-08 | Disposition: A | Discharge status: Home / Self Care | Payer: Self-pay | Attending: Emergency Medicine | Admitting: Emergency Medicine

## 2017-06-07 ENCOUNTER — Emergency Department (HOSPITAL_COMMUNITY): Payer: Self-pay

## 2017-06-07 DIAGNOSIS — F909 Attention-deficit hyperactivity disorder, unspecified type: Secondary | ICD-10-CM | POA: Insufficient documentation

## 2017-06-07 DIAGNOSIS — T50902A Poisoning by unspecified drugs, medicaments and biological substances, intentional self-harm, initial encounter: Secondary | ICD-10-CM

## 2017-06-07 DIAGNOSIS — F329 Major depressive disorder, single episode, unspecified: Secondary | ICD-10-CM | POA: Insufficient documentation

## 2017-06-07 DIAGNOSIS — F411 Generalized anxiety disorder: Secondary | ICD-10-CM | POA: Insufficient documentation

## 2017-06-07 LAB — COMPREHENSIVE METABOLIC PANEL
ALT: 13 U/L — AB (ref 17–63)
AST: 21 U/L (ref 15–41)
Albumin: 4 g/dL (ref 3.5–5.0)
Alkaline Phosphatase: 64 U/L (ref 38–126)
Anion gap: 10 (ref 5–15)
BUN: 10 mg/dL (ref 6–20)
CHLORIDE: 105 mmol/L (ref 101–111)
CO2: 23 mmol/L (ref 22–32)
CREATININE: 1.01 mg/dL (ref 0.61–1.24)
Calcium: 8.4 mg/dL — ABNORMAL LOW (ref 8.9–10.3)
GFR calc non Af Amer: >60 mL/min (ref >60)
Glucose, Bld: 88 mg/dL (ref 65–99)
POTASSIUM: 3 mmol/L — AB (ref 3.5–5.1)
SODIUM: 138 mmol/L (ref 135–145)
Total Bilirubin: 1.4 mg/dL — ABNORMAL HIGH (ref 0.3–1.2)
Total Protein: 6.5 g/dL (ref 6.5–8.1)

## 2017-06-07 LAB — CBG MONITORING, ED
Glucose-Capillary: 83 mg/dL (ref 65–99)
Glucose-Capillary: 73 mg/dL (ref 65–99)

## 2017-06-07 LAB — CBC WITH DIFFERENTIAL/PLATELET
BASOS ABS: 0 10*3/uL (ref 0.0–0.1)
BASOS PCT: 0 %
EOS PCT: 2 %
Eosinophils Absolute: 0.1 10*3/uL (ref 0.0–0.7)
HEMATOCRIT: 46.6 % (ref 39.0–52.0)
Hemoglobin: 15.8 g/dL (ref 13.0–17.0)
Lymphocytes Relative: 21 %
Lymphs Abs: 1.6 10*3/uL (ref 0.7–4.0)
MCH: 31.8 pg (ref 26.0–34.0)
MCHC: 33.9 g/dL (ref 30.0–36.0)
MCV: 93.8 fL (ref 78.0–100.0)
MONO ABS: 0.9 10*3/uL (ref 0.1–1.0)
MONOS PCT: 12 %
NEUTROS ABS: 4.9 10*3/uL (ref 1.7–7.7)
NEUTROS PCT: 65 %
PLATELETS: 238 10*3/uL (ref 150–400)
RBC: 4.97 MIL/uL (ref 4.22–5.81)
RDW: 13.1 % (ref 11.5–15.5)
WBC: 7.6 10*3/uL (ref 4.0–10.5)

## 2017-06-07 LAB — SALICYLATE LEVEL

## 2017-06-07 LAB — ACETAMINOPHEN LEVEL: Acetaminophen (Tylenol), Serum: <10 ug/mL — ABNORMAL LOW (ref 10–30)

## 2017-06-07 LAB — ETHANOL

## 2017-06-07 MED ORDER — POTASSIUM CHLORIDE CRYS ER 20 MEQ PO TBCR
40.0000 meq | EXTENDED_RELEASE_TABLET | Freq: Once | ORAL | Status: AC
  Administered 2017-06-07: 40 meq via ORAL
  Filled 2017-06-07: qty 2

## 2017-06-07 NOTE — ED Notes (Addendum)
Grandmother at bedside- states pt can't have the bread or crackers.  Pt eating applesauce and parents state they will get pt something else to eat.

## 2017-06-07 NOTE — ED Notes (Signed)
Pt's CBG result was 83. Informed Clydie Braun - RN.

## 2017-06-07 NOTE — ED Notes (Signed)
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  Large doses of Tramadol can cause seizures but Diazepam should help prevent seizures.  Recommends normal psych/overdose labs that were already drawn.

## 2017-06-07 NOTE — ED Notes (Signed)
TTS set up at bedside. 

## 2017-06-07 NOTE — ED Notes (Signed)
Dr. Criss Alvine notified of repeat CBG.  Pt given [place] sandwich and coke and encouraged to eat per EDP.  Sitter at bedside.

## 2017-06-07 NOTE — ED Notes (Signed)
Family stated that he has a gluten allergy.

## 2017-06-07 NOTE — ED Notes (Signed)
Mom and Dad at bedside.  Pt crying.  Updated mom and dad on plan of care.  ED EMT/Sitter at bedside.
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
## 2017-06-08 DIAGNOSIS — T510X2A Toxic effect of ethanol, intentional self-harm, initial encounter: Secondary | ICD-10-CM

## 2017-06-08 DIAGNOSIS — F411 Generalized anxiety disorder: Secondary | ICD-10-CM

## 2017-06-08 DIAGNOSIS — T404X2A Poisoning by other synthetic narcotics, intentional self-harm, initial encounter: Secondary | ICD-10-CM

## 2017-06-08 DIAGNOSIS — T424X2A Poisoning by benzodiazepines, intentional self-harm, initial encounter: Secondary | ICD-10-CM

## 2017-06-08 DIAGNOSIS — T1491XA Suicide attempt, initial encounter: Secondary | ICD-10-CM

## 2017-06-08 DIAGNOSIS — F332 Major depressive disorder, recurrent severe without psychotic features: Secondary | ICD-10-CM

## 2017-06-08 LAB — CBG MONITORING, ED
GLUCOSE-CAPILLARY: 75 mg/dL (ref 65–99)
GLUCOSE-CAPILLARY: 71 mg/dL (ref 65–99)

## 2017-06-08 MED ORDER — ONDANSETRON HCL 4 MG PO TABS: 4.0000 mg | ORAL_TABLET | Freq: Three times a day (TID) | ORAL | Status: DC | PRN

## 2017-06-08 MED ORDER — IBUPROFEN 200 MG PO TABS: 600.0000 mg | ORAL_TABLET | Freq: Three times a day (TID) | ORAL | Status: DC | PRN

## 2017-06-08 MED ORDER — DICYCLOMINE HCL 20 MG PO TABS
20.0000 mg | ORAL_TABLET | Freq: Four times a day (QID) | ORAL | Status: DC | PRN
Start: 2017-06-08 — End: 2017-06-08

## 2017-06-08 MED ORDER — ALUM & MAG HYDROXIDE-SIMETH 200-200-20 MG/5ML PO SUSP: 30.0000 mL | Freq: Four times a day (QID) | ORAL | Status: DC | PRN

## 2017-06-08 MED ORDER — NICOTINE 21 MG/24HR TD PT24: 21.0000 mg | MEDICATED_PATCH | Freq: Every day | TRANSDERMAL | Status: DC

## 2017-06-08 MED ORDER — ZOLPIDEM TARTRATE 5 MG PO TABS: 5.0000 mg | ORAL_TABLET | Freq: Every evening | ORAL | Status: DC | PRN

## 2017-06-08 NOTE — Telephone Encounter (Signed)
Patient's mom notified that Dr. Unice Bailey office will contact [facility] to schedule an appt

## 2017-06-08 NOTE — ED Notes (Signed)
Dr. Wilkie Aye notified of pt being sinus brady in the 40s on cardiac monitor.  At times HR drops to 38.  Repeat EKG ordered.

## 2017-06-08 NOTE — Progress Notes (Signed)
Per Ferne Reus, NP, the patient does not meet criteria for inpatient treatment. Patient is recommended for discharge and to follow up with outpatient providers.    Charlton Amor, RN notified.     Baldo Daub MSW, LCSWA CSW Disposition [phone]
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
## 2017-06-08 NOTE — Progress Notes (Deleted)
l °

## 2017-06-08 NOTE — ED Notes (Signed)
Video TTS in progress

## 2017-06-08 NOTE — ED Notes (Signed)
Checked [facility] 77 RN Clydie Braun informed

## 2017-06-08 NOTE — ED Notes (Addendum)
Spoke with Revonda Standard from [employer].  Recommends repeat EKG prior to discharge.

## 2017-06-08 NOTE — ED Notes (Signed)
Ordered breakfast tray  

## 2017-06-08 NOTE — Progress Notes (Signed)
Patient meets criteria for inpatient treatment. CSW faxed referrals to the following inpatient facilities for review:  [facility], [facility], [facility], [facility], [facility], [facility], [facility], [facility], [facility]    TTS will continue to seek bed placement.  Blake Bradley MSW, LCSWA CSW Disposition [phone]   

## 2017-06-08 NOTE — ED Notes (Signed)
Poison control called back asking questions such vitals intake and output  disposition
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
## 2017-06-08 NOTE — ED Notes (Signed)
Pt drank coke, sprite, and ate applesauce.  Pt refusing to eat anything else.  States his stomach will get upset if he eats this late.  Dad reports pt can't remember to eat and has gone days without eating.  Mom offered multiple places and choices for food for pt and he doesn't want anything.

## 2017-06-09 ENCOUNTER — Telehealth (HOSPITAL_COMMUNITY): Payer: Self-pay

## 2017-06-09 ENCOUNTER — Inpatient Hospital Stay: Payer: Self-pay | Admitting: Sports Medicine

## 2017-06-09 ENCOUNTER — Ambulatory Visit: Payer: Self-pay | Admitting: Sports Medicine

## 2017-06-11 ENCOUNTER — Encounter: Payer: Self-pay | Admitting: Sports Medicine

## 2017-06-11 ENCOUNTER — Ambulatory Visit (INDEPENDENT_AMBULATORY_CARE_PROVIDER_SITE_OTHER): Payer: Self-pay | Admitting: Sports Medicine

## 2017-06-11 DIAGNOSIS — S060X1A Concussion with loss of consciousness of 30 minutes or less, initial encounter: Secondary | ICD-10-CM

## 2017-06-11 DIAGNOSIS — R519 Headache, unspecified: Secondary | ICD-10-CM | POA: Insufficient documentation

## 2017-06-11 DIAGNOSIS — R51 Headache: Secondary | ICD-10-CM

## 2017-06-11 MED ORDER — AMITRIPTYLINE HCL 50 MG PO TABS: ORAL_TABLET | ORAL | 3 refills | Status: DC
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
## 2017-06-11 NOTE — Assessment & Plan Note (Addendum)
Was seen in the emergency room, still has some dizziness, he is about 2-3 weeks out. I am going to put him on complete physical and cognitive rest including out of work for the next week and see him weekly until symptoms have resolved. He is having episodes of crying, and possibly had a suicide attempt. I'm going to add amitriptyline at bedtime to help with mood, as well as as a muscle relaxer and help him sleep. He does have some pain over the midthoracic spine so I'm going to add T spine x-rays. He will establish care with one of my partners, I do think ultimately he will need assistance from psychiatry for treatment of his mood disorder.

## 2017-06-18 ENCOUNTER — Ambulatory Visit: Payer: Self-pay | Admitting: Sports Medicine

## 2017-06-18 DIAGNOSIS — Z0189 Encounter for other specified special examinations: Secondary | ICD-10-CM

## 2017-06-19 ENCOUNTER — Ambulatory Visit (INDEPENDENT_AMBULATORY_CARE_PROVIDER_SITE_OTHER): Payer: Self-pay | Admitting: Sports Medicine

## 2017-06-19 ENCOUNTER — Encounter: Payer: Self-pay | Admitting: Sports Medicine

## 2017-06-19 DIAGNOSIS — R51 Headache: Secondary | ICD-10-CM

## 2017-06-19 DIAGNOSIS — R519 Headache, unspecified: Secondary | ICD-10-CM

## 2017-06-19 DIAGNOSIS — G4452 New daily persistent headache (NDPH): Secondary | ICD-10-CM

## 2017-06-19 DIAGNOSIS — F411 Generalized anxiety disorder: Secondary | ICD-10-CM

## 2017-06-19 MED ORDER — RIZATRIPTAN BENZOATE 5 MG PO TBDP: 5.0000 mg | ORAL_TABLET | ORAL | 3 refills | Status: DC | PRN

## 2017-06-19 MED ORDER — SERTRALINE HCL 25 MG PO TABS: 25.0000 mg | ORAL_TABLET | Freq: Every day | ORAL | 2 refills | Status: DC
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
## 2017-06-19 NOTE — Assessment & Plan Note (Signed)
Concussion symptoms are for the most part resolved. He does have a history of migraines, adding Maxalt for abortive treatment. I would like to get him in with neurology as well. His father sees Dr. Vic[facility].  Was unable to tolerate Elavil with worsening of hemorrhoids. His father tells me that he has a mild form of multiple sclerosis, and Blake Bradley is waking up with headaches. For this reason we are going to obtain a brain MRI with and without contrast.

## 2017-06-19 NOTE — Assessment & Plan Note (Signed)
Adding low-dose sertraline. He does need to find a primary care provider.

## 2017-06-22 ENCOUNTER — Other Ambulatory Visit: Payer: Self-pay

## 2017-06-22 DIAGNOSIS — F411 Generalized anxiety disorder: Secondary | ICD-10-CM

## 2017-06-22 NOTE — Progress Notes (Signed)
Error

## 2017-06-29 ENCOUNTER — Ambulatory Visit (INDEPENDENT_AMBULATORY_CARE_PROVIDER_SITE_OTHER): Payer: Self-pay

## 2017-06-29 DIAGNOSIS — R519 Headache, unspecified: Secondary | ICD-10-CM

## 2017-06-29 DIAGNOSIS — G4452 New daily persistent headache (NDPH): Secondary | ICD-10-CM

## 2017-06-29 DIAGNOSIS — R410 Disorientation, unspecified: Secondary | ICD-10-CM

## 2017-06-29 DIAGNOSIS — R42 Dizziness and giddiness: Secondary | ICD-10-CM

## 2017-06-29 DIAGNOSIS — R51 Headache: Secondary | ICD-10-CM

## 2017-06-29 DIAGNOSIS — H539 Unspecified visual disturbance: Secondary | ICD-10-CM

## 2017-06-29 MED ORDER — GADOBENATE DIMEGLUMINE 529 MG/ML IV SOLN
15.0000 mL | Freq: Once | INTRAVENOUS | Status: AC | PRN
  Administered 2017-06-29: 15 mL via INTRAVENOUS

## 2017-07-03 ENCOUNTER — Ambulatory Visit: Payer: Self-pay | Admitting: Internal Medicine

## 2017-07-06 ENCOUNTER — Telehealth: Payer: Self-pay | Admitting: Sports Medicine

## 2017-07-06 DIAGNOSIS — F411 Generalized anxiety disorder: Secondary | ICD-10-CM

## 2017-07-06 MED ORDER — SERTRALINE HCL 50 MG PO TABS: 50.0000 mg | ORAL_TABLET | Freq: Every day | ORAL | 3 refills | Status: DC

## 2017-07-06 MED ORDER — CITALOPRAM HYDROBROMIDE 10 MG PO TABS: 10.0000 mg | ORAL_TABLET | Freq: Every day | ORAL | 3 refills | Status: DC

## 2017-07-06 NOTE — Telephone Encounter (Signed)
Pt has not been taking the sertraline or the Rx for his headaches due to cost. Questions if there is a cheaper option, or something on the $4-$8 list at [facility]. Routing.

## 2017-07-06 NOTE — Telephone Encounter (Signed)
Let's go ahead and bump up the sertraline dose to 50 mg, he can take 2 pills until he runs out, I'm going to call in some 50 mg pills. Make sure he's taking these. Also discontinue his scheduled follow-up and make another follow-up appointment for 3 weeks from now.

## 2017-07-06 NOTE — Telephone Encounter (Signed)
Left detailed VM for Pt's father regarding Rx.  Per Dr T, he is OK to write an updated work note with the restrictions of no heavy lifting and no climbing ladders. Did ask father to return clinic call to make sure nothing else needs to be added to the letter before it's written.   Callback provided.

## 2017-07-06 NOTE — Telephone Encounter (Signed)
Pt's father called clinic stating the Pt went back to work at [employer] and they have him working nights (5pm-9pm) including heavy lifting and climbing on ladders. Pt's father states this has caused the Pt to experience dizziness, which did cause him to fall in the shower. Pt also is experiencing random crying events again. Questions what should be done, of if Pt needs to come in sooner than next week when scheduled. Routing for review.

## 2017-07-06 NOTE — Telephone Encounter (Signed)
Pt's father would like letter to state the Pt can only work for 4 hours, no heavy lifting, no ladders. Routing for review. Pt's father will print letter from home via MyChart.

## 2017-07-06 NOTE — Telephone Encounter (Signed)
Since we are starting from scratch now, I'm going to seen in Celexa 10 mg to [facility], he should take this every day for one month before coming back in.

## 2017-07-14 ENCOUNTER — Telehealth: Payer: Self-pay

## 2017-07-14 NOTE — Telephone Encounter (Signed)
Father of pt left VM stating pt can not be seen in [facility] because they would need an upfront payment and pt doesn't have. Would like to know if there's someone else the pt can be sent to. Please advise.

## 2017-07-17 ENCOUNTER — Ambulatory Visit: Payer: Self-pay | Admitting: Sports Medicine

## 2017-08-10 ENCOUNTER — Ambulatory Visit: Payer: Self-pay | Admitting: Sports Medicine

## 2017-08-10 ENCOUNTER — Telehealth: Payer: Self-pay | Admitting: Sports Medicine

## 2017-08-10 MED ORDER — TRAZODONE HCL 50 MG PO TABS: 50.0000 mg | ORAL_TABLET | Freq: Every day | ORAL | 1 refills | Status: DC

## 2017-08-10 NOTE — Telephone Encounter (Signed)
Extending time out of work until the sixth, this sounds more like persistently uncontrolled depression, is he taking the Celexa as we discussed?  I am also going to add low-dose trazodone at bedtime.  This will help him sleep and is synergistic with SSRIs to improve mood type symptoms.  Also letter in box.

## 2017-08-10 NOTE — Telephone Encounter (Signed)
Patient's father called pt needs a note extension to be out of work until mid next week. Pt see's neuro Nov 6th was the soonest they could get him in. Pt was originally scheduled for Dec but someone cancelled at Neuro in which he was able to get in sooner. Pt still dizzy, light headed and real bad headaches nothing has changed. Sleeping an hr to hr 1/2 that's it. Pt unable to work still feeling this way. Please adv... Pt's father  [phone] can call back about the work note extension.

## 2017-08-11 NOTE — Telephone Encounter (Signed)
Okay thank you called pt's father back and lvm a detailed vm.

## 2017-08-13 ENCOUNTER — Telehealth: Payer: Self-pay | Admitting: Internal Medicine

## 2017-08-13 ENCOUNTER — Ambulatory Visit (INDEPENDENT_AMBULATORY_CARE_PROVIDER_SITE_OTHER): Payer: Self-pay | Admitting: Neurology

## 2017-08-13 ENCOUNTER — Encounter: Payer: Self-pay | Admitting: Neurology

## 2017-08-13 ENCOUNTER — Telehealth: Payer: Self-pay | Admitting: Neurology

## 2017-08-13 VITALS — BP 132/71 | HR 50 | Resp 14 | Ht 72.0 in | Wt 161.5 lb

## 2017-08-13 DIAGNOSIS — R42 Dizziness and giddiness: Secondary | ICD-10-CM

## 2017-08-13 DIAGNOSIS — R519 Headache, unspecified: Secondary | ICD-10-CM

## 2017-08-13 DIAGNOSIS — R51 Headache: Secondary | ICD-10-CM

## 2017-08-13 DIAGNOSIS — F0781 Postconcussional syndrome: Secondary | ICD-10-CM

## 2017-08-13 HISTORY — DX: Dizziness and giddiness: R42

## 2017-08-13 MED ORDER — IMIPRAMINE HCL 25 MG PO TABS: ORAL_TABLET | ORAL | 3 refills | Status: DC

## 2017-08-13 MED ORDER — INDOMETHACIN 25 MG PO CAPS: 25.0000 mg | ORAL_CAPSULE | Freq: Two times a day (BID) | ORAL | 1 refills | Status: DC

## 2017-08-13 MED ORDER — INDOMETHACIN 25 MG PO CAPS: 25.0000 mg | ORAL_CAPSULE | Freq: Three times a day (TID) | ORAL | 1 refills | Status: DC

## 2017-08-13 NOTE — Patient Instructions (Signed)
Take indomethacin 25 mg 1 pill after each meal if he gets stomach upset add over-the-counter Pepcid (famotidine)  Take imipramine 25 mg nightly. If he tolerated well consider going up to 2 pills at bedtime  If no better in 2 weeks give us a call. If you note new symptoms also give us a call.
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
## 2017-08-13 NOTE — Telephone Encounter (Signed)
Ok to take

## 2017-08-13 NOTE — Telephone Encounter (Signed)
Yes

## 2017-08-13 NOTE — Telephone Encounter (Signed)
Pt father calling to inform that pt just came to him to inform that he is feeling worse now than ever before.  Pt father is asking to be called re: how pt is feeling

## 2017-08-13 NOTE — Telephone Encounter (Signed)
Patient notified
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
## 2017-08-18 ENCOUNTER — Ambulatory Visit (INDEPENDENT_AMBULATORY_CARE_PROVIDER_SITE_OTHER): Payer: Self-pay | Admitting: Sports Medicine

## 2017-08-18 ENCOUNTER — Encounter: Payer: Self-pay | Admitting: Sports Medicine

## 2017-08-18 DIAGNOSIS — F0781 Postconcussional syndrome: Secondary | ICD-10-CM

## 2017-08-18 NOTE — Assessment & Plan Note (Signed)
At this point we have tried multiple medications, with either noncompliance for very short term usage, he is not taking the imipramine or the indomethacin. Trazodone caused constipation, SSRIs were intolerable. I think at this point we need to start to push him to get back into daily life, back into physical activity. I think we need to give the simply more time, and I explained to him that at this point he should be doing what he wants to do. I will keep him out of work for another 2 weeks and then we can start back at 4 hours a day for the next 2 weeks and then 8 hours a day.   From sports medicine perspective he can return to see me on an as-needed basis. Today his physical exam, balance, neurologic exam, affect was all normal.
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
## 2017-09-01 ENCOUNTER — Ambulatory Visit (HOSPITAL_COMMUNITY): Payer: Self-pay | Admitting: Psychiatry

## 2017-11-12 ENCOUNTER — Ambulatory Visit: Payer: Self-pay | Admitting: Neurology

## 2017-11-13 ENCOUNTER — Encounter: Payer: Self-pay | Admitting: Neurology

## 2017-11-29 ENCOUNTER — Encounter (HOSPITAL_COMMUNITY): Payer: Self-pay

## 2017-11-29 ENCOUNTER — Other Ambulatory Visit: Payer: Self-pay

## 2017-11-29 ENCOUNTER — Emergency Department (HOSPITAL_COMMUNITY)
Admission: EM | Admit: 2017-11-29 | Discharge: 2017-11-29 | Disposition: A | Discharge status: Home / Self Care | Payer: Self-pay | Attending: Emergency Medicine | Admitting: Emergency Medicine

## 2017-11-29 DIAGNOSIS — R197 Diarrhea, unspecified: Secondary | ICD-10-CM | POA: Insufficient documentation

## 2017-11-29 DIAGNOSIS — Z79899 Other long term (current) drug therapy: Secondary | ICD-10-CM | POA: Insufficient documentation

## 2017-11-29 DIAGNOSIS — M7918 Myalgia, other site: Secondary | ICD-10-CM | POA: Insufficient documentation

## 2017-11-29 DIAGNOSIS — R112 Nausea with vomiting, unspecified: Secondary | ICD-10-CM | POA: Insufficient documentation

## 2017-11-29 LAB — URINALYSIS, ROUTINE W REFLEX MICROSCOPIC
BACTERIA UA: NONE SEEN
Glucose, UA: NEGATIVE mg/dL
HGB URINE DIPSTICK: NEGATIVE
Ketones, ur: 20 mg/dL — AB
LEUKOCYTES UA: NEGATIVE
Nitrite: NEGATIVE
PROTEIN: 30 mg/dL — AB
RBC / HPF: NONE SEEN RBC/hpf (ref 0–5)
SQUAMOUS EPITHELIAL / LPF: NONE SEEN
Specific Gravity, Urine: 1.032 — ABNORMAL HIGH (ref 1.005–1.030)
pH: 5 (ref 5.0–8.0)

## 2017-11-29 MED ORDER — PROMETHAZINE HCL 25 MG PO TABS: 25.0000 mg | ORAL_TABLET | Freq: Four times a day (QID) | ORAL | 0 refills | Status: DC | PRN

## 2017-11-29 MED ORDER — HYDROMORPHONE HCL 1 MG/ML IJ SOLN
0.5000 mg | Freq: Once | INTRAMUSCULAR | Status: AC
  Administered 2017-11-29: 0.5 mg via INTRAMUSCULAR
  Filled 2017-11-29: qty 1

## 2017-11-29 MED ORDER — METHOCARBAMOL 500 MG PO TABS: 500.0000 mg | ORAL_TABLET | Freq: Two times a day (BID) | ORAL | 0 refills | Status: DC

## 2017-11-29 MED ORDER — ONDANSETRON 4 MG PO TBDP
4.0000 mg | ORAL_TABLET | Freq: Once | ORAL | Status: AC
  Administered 2017-11-29: 4 mg via ORAL
  Filled 2017-11-29: qty 1

## 2017-11-29 MED ORDER — ONDANSETRON 4 MG PO TBDP
4.0000 mg | ORAL_TABLET | Freq: Once | ORAL | Status: AC | PRN
  Administered 2017-11-29: 4 mg via ORAL
  Filled 2017-11-29: qty 1

## 2017-11-29 NOTE — ED Triage Notes (Signed)
Patient complains of lower back pain with nausea and vomiting since last night. States that the pain is worse with any movement, denies trauma. Denies dysuria
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
## 2017-11-29 NOTE — Discharge Instructions (Signed)
Push fluids: take small frequent sips of water or Gatorade, do not drink any soda, juice or caffeinated beverages.    Slowly resume solid diet as desired. Avoid food that are spicy, contain dairy and/or have high fat content.  Aviod NSAIDs (aspirin, motrin, ibuprofen, naproxen, Aleve et Karie Sodacetera) for pain control because they will irritate your stomach.  Please follow with your primary care doctor in the next 2 days for a check-up. They must obtain records for further management.   Do not hesitate to return to the Emergency Department for any new, worsening or concerning symptoms.   For pain control  you can take  tylenol (acetaminophen) 975mg  (this is 3 over the counter pills) four times a day. Do not drink alcohol or combine with other medications that have acetaminophen as an ingredient (Read the labels!).    For breakthrough pain you may take Robaxin. Do not drink alcohol, drive or operate heavy machinery when taking Robaxin.

## 2017-11-29 NOTE — ED Notes (Signed)
Patient verbalizes understanding of discharge instructions. Opportunity for questioning and answers were provided. Armband removed by staff, pt discharged from ED ambulatory.   

## 2017-11-29 NOTE — ED Notes (Signed)
0.5 mg dilaudid wasted, witnessed by Tammy SoursGreg, RN in [place]

## 2018-10-13 DIAGNOSIS — B2710 Cytomegaloviral mononucleosis without complications: Secondary | ICD-10-CM

## 2018-10-13 HISTORY — DX: Cytomegaloviral mononucleosis without complications: B27.10

## 2018-10-17 ENCOUNTER — Emergency Department (HOSPITAL_COMMUNITY)
Admission: EM | Admit: 2018-10-17 | Discharge: 2018-10-17 | Discharge status: Against Medical Advice | Payer: Medicaid Other | Attending: Emergency Medicine | Admitting: Emergency Medicine

## 2018-10-17 ENCOUNTER — Encounter (HOSPITAL_COMMUNITY): Payer: Self-pay | Admitting: *Deleted

## 2018-10-17 ENCOUNTER — Other Ambulatory Visit: Payer: Self-pay

## 2018-10-17 DIAGNOSIS — Z5321 Procedure and treatment not carried out due to patient leaving prior to being seen by health care provider: Secondary | ICD-10-CM | POA: Diagnosis not present

## 2018-10-17 DIAGNOSIS — R51 Headache: Secondary | ICD-10-CM | POA: Insufficient documentation

## 2018-10-17 DIAGNOSIS — M7918 Myalgia, other site: Secondary | ICD-10-CM | POA: Insufficient documentation

## 2018-10-17 NOTE — ED Triage Notes (Signed)
The pt has generalized body aches for 4 days he has a severe headache and cannot sleep  unknwn temp  He went to a pharmacy tonight and was told to come here

## 2018-10-18 ENCOUNTER — Other Ambulatory Visit: Payer: Self-pay

## 2018-10-18 ENCOUNTER — Emergency Department (HOSPITAL_COMMUNITY): Payer: Medicaid Other

## 2018-10-18 ENCOUNTER — Encounter (HOSPITAL_COMMUNITY): Payer: Self-pay | Admitting: Emergency Medicine

## 2018-10-18 ENCOUNTER — Encounter (HOSPITAL_COMMUNITY): Payer: Self-pay

## 2018-10-18 ENCOUNTER — Ambulatory Visit (HOSPITAL_COMMUNITY)
Admission: EM | Admit: 2018-10-18 | Discharge: 2018-10-18 | Disposition: A | Discharge status: Home / Self Care | Payer: Medicaid Other | Attending: Internal Medicine | Admitting: Internal Medicine

## 2018-10-18 ENCOUNTER — Inpatient Hospital Stay (HOSPITAL_COMMUNITY)
Admission: EM | Admit: 2018-10-18 | Discharge: 2018-10-26 | DRG: 866 | Disposition: A | Discharge status: Home / Self Care | Payer: Medicaid Other | Attending: Internal Medicine | Admitting: Internal Medicine

## 2018-10-18 DIAGNOSIS — B179 Acute viral hepatitis, unspecified: Secondary | ICD-10-CM

## 2018-10-18 DIAGNOSIS — T380X5A Adverse effect of glucocorticoids and synthetic analogues, initial encounter: Secondary | ICD-10-CM | POA: Diagnosis not present

## 2018-10-18 DIAGNOSIS — B259 Cytomegaloviral disease, unspecified: Secondary | ICD-10-CM

## 2018-10-18 DIAGNOSIS — M791 Myalgia, unspecified site: Secondary | ICD-10-CM | POA: Diagnosis not present

## 2018-10-18 DIAGNOSIS — K589 Irritable bowel syndrome without diarrhea: Secondary | ICD-10-CM | POA: Diagnosis not present

## 2018-10-18 DIAGNOSIS — F411 Generalized anxiety disorder: Secondary | ICD-10-CM | POA: Diagnosis present

## 2018-10-18 DIAGNOSIS — N179 Acute kidney failure, unspecified: Secondary | ICD-10-CM | POA: Diagnosis present

## 2018-10-18 DIAGNOSIS — R7401 Elevation of levels of liver transaminase levels: Secondary | ICD-10-CM

## 2018-10-18 DIAGNOSIS — F0781 Postconcussional syndrome: Secondary | ICD-10-CM | POA: Diagnosis not present

## 2018-10-18 DIAGNOSIS — Z888 Allergy status to other drugs, medicaments and biological substances status: Secondary | ICD-10-CM | POA: Diagnosis not present

## 2018-10-18 DIAGNOSIS — F988 Other specified behavioral and emotional disorders with onset usually occurring in childhood and adolescence: Secondary | ICD-10-CM | POA: Diagnosis present

## 2018-10-18 DIAGNOSIS — B2710 Cytomegaloviral mononucleosis without complications: Secondary | ICD-10-CM | POA: Diagnosis not present

## 2018-10-18 DIAGNOSIS — R740 Nonspecific elevation of levels of transaminase and lactic acid dehydrogenase [LDH]: Secondary | ICD-10-CM

## 2018-10-18 DIAGNOSIS — R82998 Other abnormal findings in urine: Secondary | ICD-10-CM | POA: Insufficient documentation

## 2018-10-18 DIAGNOSIS — L500 Allergic urticaria: Secondary | ICD-10-CM | POA: Diagnosis not present

## 2018-10-18 DIAGNOSIS — D696 Thrombocytopenia, unspecified: Secondary | ICD-10-CM | POA: Diagnosis present

## 2018-10-18 DIAGNOSIS — H9203 Otalgia, bilateral: Secondary | ICD-10-CM | POA: Diagnosis present

## 2018-10-18 DIAGNOSIS — F419 Anxiety disorder, unspecified: Secondary | ICD-10-CM | POA: Diagnosis present

## 2018-10-18 DIAGNOSIS — R519 Headache, unspecified: Secondary | ICD-10-CM

## 2018-10-18 DIAGNOSIS — K920 Hematemesis: Secondary | ICD-10-CM | POA: Diagnosis not present

## 2018-10-18 DIAGNOSIS — Z885 Allergy status to narcotic agent status: Secondary | ICD-10-CM

## 2018-10-18 DIAGNOSIS — R51 Headache: Secondary | ICD-10-CM | POA: Diagnosis present

## 2018-10-18 DIAGNOSIS — K580 Irritable bowel syndrome with diarrhea: Secondary | ICD-10-CM | POA: Diagnosis present

## 2018-10-18 DIAGNOSIS — E876 Hypokalemia: Secondary | ICD-10-CM | POA: Diagnosis present

## 2018-10-18 DIAGNOSIS — J029 Acute pharyngitis, unspecified: Secondary | ICD-10-CM | POA: Diagnosis not present

## 2018-10-18 DIAGNOSIS — R7989 Other specified abnormal findings of blood chemistry: Secondary | ICD-10-CM | POA: Diagnosis not present

## 2018-10-18 DIAGNOSIS — R748 Abnormal levels of other serum enzymes: Secondary | ICD-10-CM | POA: Diagnosis not present

## 2018-10-18 DIAGNOSIS — T391X1A Poisoning by 4-Aminophenol derivatives, accidental (unintentional), initial encounter: Secondary | ICD-10-CM

## 2018-10-18 DIAGNOSIS — R1011 Right upper quadrant pain: Secondary | ICD-10-CM | POA: Diagnosis not present

## 2018-10-18 DIAGNOSIS — Z9049 Acquired absence of other specified parts of digestive tract: Secondary | ICD-10-CM | POA: Diagnosis not present

## 2018-10-18 DIAGNOSIS — B2719 Cytomegaloviral mononucleosis with other complication: Secondary | ICD-10-CM | POA: Diagnosis present

## 2018-10-18 DIAGNOSIS — R509 Fever, unspecified: Secondary | ICD-10-CM | POA: Diagnosis not present

## 2018-10-18 DIAGNOSIS — R112 Nausea with vomiting, unspecified: Secondary | ICD-10-CM | POA: Diagnosis not present

## 2018-10-18 DIAGNOSIS — Z79899 Other long term (current) drug therapy: Secondary | ICD-10-CM | POA: Diagnosis not present

## 2018-10-18 DIAGNOSIS — B349 Viral infection, unspecified: Secondary | ICD-10-CM | POA: Diagnosis not present

## 2018-10-18 DIAGNOSIS — R630 Anorexia: Secondary | ICD-10-CM | POA: Diagnosis not present

## 2018-10-18 DIAGNOSIS — F319 Bipolar disorder, unspecified: Secondary | ICD-10-CM | POA: Diagnosis present

## 2018-10-18 DIAGNOSIS — K2970 Gastritis, unspecified, without bleeding: Secondary | ICD-10-CM | POA: Diagnosis present

## 2018-10-18 DIAGNOSIS — R945 Abnormal results of liver function studies: Secondary | ICD-10-CM | POA: Diagnosis not present

## 2018-10-18 DIAGNOSIS — M549 Dorsalgia, unspecified: Secondary | ICD-10-CM | POA: Diagnosis not present

## 2018-10-18 HISTORY — DX: Nausea with vomiting, unspecified: R11.2

## 2018-10-18 HISTORY — DX: Dizziness and giddiness: R42

## 2018-10-18 HISTORY — DX: Patient's noncompliance with other medical treatment and regimen: Z91.19

## 2018-10-18 HISTORY — DX: Acute viral hepatitis, unspecified: B17.9

## 2018-10-18 HISTORY — DX: Diarrhea, unspecified: R19.7

## 2018-10-18 LAB — BASIC METABOLIC PANEL
Anion gap: 5 (ref 5–15)
BUN: 13 mg/dL (ref 6–20)
CO2: 26 mmol/L (ref 22–32)
CREATININE: 1.13 mg/dL (ref 0.61–1.24)
Calcium: 8.8 mg/dL — ABNORMAL LOW (ref 8.9–10.3)
Chloride: 107 mmol/L (ref 98–111)
GFR calc Af Amer: >60 mL/min (ref >60)
GFR calc non Af Amer: >60 mL/min (ref >60)
Glucose, Bld: 113 mg/dL — ABNORMAL HIGH (ref 70–99)
Potassium: 4.2 mmol/L (ref 3.5–5.1)
Sodium: 138 mmol/L (ref 135–145)

## 2018-10-18 LAB — HEPATIC FUNCTION PANEL
ALT: 198 U/L — ABNORMAL HIGH (ref 0–44)
AST: 161 U/L — ABNORMAL HIGH (ref 15–41)
Albumin: 4.4 g/dL (ref 3.5–5.0)
Alkaline Phosphatase: 254 U/L — ABNORMAL HIGH (ref 38–126)
Bilirubin, Direct: 1.2 mg/dL — ABNORMAL HIGH (ref 0.0–0.2)
Indirect Bilirubin: 1.3 mg/dL — ABNORMAL HIGH (ref 0.3–0.9)
Total Bilirubin: 2.5 mg/dL — ABNORMAL HIGH (ref 0.3–1.2)
Total Protein: 7.5 g/dL (ref 6.5–8.1)

## 2018-10-18 LAB — URINALYSIS, ROUTINE W REFLEX MICROSCOPIC
Glucose, UA: NEGATIVE mg/dL
HGB URINE DIPSTICK: NEGATIVE
Ketones, ur: NEGATIVE mg/dL
Leukocytes, UA: NEGATIVE
Nitrite: NEGATIVE
PROTEIN: NEGATIVE mg/dL
Specific Gravity, Urine: 1.026 (ref 1.005–1.030)
pH: 6 (ref 5.0–8.0)

## 2018-10-18 LAB — CBC
HCT: 44.8 % (ref 39.0–52.0)
Hemoglobin: 15.3 g/dL (ref 13.0–17.0)
MCH: 30.5 pg (ref 26.0–34.0)
MCHC: 34.2 g/dL (ref 30.0–36.0)
MCV: 89.4 fL (ref 80.0–100.0)
PLATELETS: 152 10*3/uL (ref 150–400)
RBC: 5.01 MIL/uL (ref 4.22–5.81)
RDW: 12.9 % (ref 11.5–15.5)
WBC: 6.8 10*3/uL (ref 4.0–10.5)
nRBC: 0 % (ref 0.0–0.2)

## 2018-10-18 LAB — LIPASE, BLOOD: Lipase: 46 U/L (ref 11–51)

## 2018-10-18 LAB — ACETAMINOPHEN LEVEL

## 2018-10-18 LAB — SALICYLATE LEVEL: Salicylate Lvl: <7 mg/dL (ref 2.8–30.0)

## 2018-10-18 MED ORDER — DIPHENHYDRAMINE HCL 50 MG/ML IJ SOLN
25.0000 mg | Freq: Once | INTRAMUSCULAR | Status: AC
  Administered 2018-10-18: 25 mg via INTRAVENOUS
  Filled 2018-10-18: qty 1

## 2018-10-18 MED ORDER — LACTATED RINGERS IV BOLUS
1000.0000 mL | Freq: Once | INTRAVENOUS | Status: AC
  Administered 2018-10-18: 1000 mL via INTRAVENOUS

## 2018-10-18 MED ORDER — ACETYLCYSTEINE LOAD VIA INFUSION: 150.0000 mg/kg | Freq: Once | INTRAVENOUS | Status: DC

## 2018-10-18 MED ORDER — METOCLOPRAMIDE HCL 5 MG/ML IJ SOLN
10.0000 mg | Freq: Once | INTRAMUSCULAR | Status: AC
  Administered 2018-10-18: 10 mg via INTRAVENOUS
  Filled 2018-10-18: qty 2

## 2018-10-18 MED ORDER — ACETYLCYSTEINE LOAD VIA INFUSION
150.0000 mg/kg | Freq: Once | INTRAVENOUS | Status: AC
  Administered 2018-10-18: 12240 mg via INTRAVENOUS
  Filled 2018-10-18: qty 306

## 2018-10-18 MED ORDER — DEXTROSE 5 % IV SOLN
15.0000 mg/kg/h | INTRAVENOUS | Status: DC
  Filled 2018-10-18: qty 200
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
## 2018-10-18 NOTE — ED Triage Notes (Addendum)
Headache and popping in ears.  Right ear is ringing if someone speaks in this ear.  patient has headache.    States he is taking 3-5 evry 4-6 hours and patient reports urine is dark.  Patient has back pain  Chest soreness with coughing up of congestion
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
## 2018-10-18 NOTE — ED Notes (Signed)
Pt not in room.

## 2018-10-18 NOTE — ED Notes (Signed)
Lab to add on lipase from previous blood collection

## 2018-10-18 NOTE — ED Triage Notes (Signed)
Pt arrives POV from UC for eval of headache, R ear fullness/ringing. Pt reports he was here 1/5 for HA complaint but waited 8 hours and LWBS. Pt also c/o severe blt flank pain w/ "nearly black" urine. Pt is calm, pleasant and cooperative in triage. Tearful stating he is in severe pain and wants to know whats going on.

## 2018-10-19 ENCOUNTER — Inpatient Hospital Stay (HOSPITAL_COMMUNITY): Payer: Medicaid Other

## 2018-10-19 ENCOUNTER — Encounter (HOSPITAL_COMMUNITY): Payer: Self-pay | Admitting: Internal Medicine

## 2018-10-19 LAB — RESPIRATORY PANEL BY PCR
Adenovirus: NOT DETECTED
Bordetella pertussis: NOT DETECTED
CORONAVIRUS HKU1-RVPPCR: NOT DETECTED
Chlamydophila pneumoniae: NOT DETECTED
Coronavirus 229E: NOT DETECTED
Coronavirus NL63: NOT DETECTED
Coronavirus OC43: NOT DETECTED
Influenza A: NOT DETECTED
Influenza B: NOT DETECTED
Metapneumovirus: NOT DETECTED
Mycoplasma pneumoniae: NOT DETECTED
Parainfluenza Virus 1: NOT DETECTED
Parainfluenza Virus 2: NOT DETECTED
Parainfluenza Virus 3: NOT DETECTED
Parainfluenza Virus 4: NOT DETECTED
Respiratory Syncytial Virus: NOT DETECTED
Rhinovirus / Enterovirus: NOT DETECTED

## 2018-10-19 LAB — HEPATIC FUNCTION PANEL
ALBUMIN: 2.8 g/dL — AB (ref 3.5–5.0)
ALT: 158 U/L — ABNORMAL HIGH (ref 0–44)
ALT: 156 U/L — ABNORMAL HIGH (ref 0–44)
AST: 118 U/L — ABNORMAL HIGH (ref 15–41)
AST: 144 U/L — ABNORMAL HIGH (ref 15–41)
Albumin: 3.1 g/dL — ABNORMAL LOW (ref 3.5–5.0)
Alkaline Phosphatase: 191 U/L — ABNORMAL HIGH (ref 38–126)
Alkaline Phosphatase: 186 U/L — ABNORMAL HIGH (ref 38–126)
Bilirubin, Direct: 1.4 mg/dL — ABNORMAL HIGH (ref 0.0–0.2)
Bilirubin, Direct: 1.3 mg/dL — ABNORMAL HIGH (ref 0.0–0.2)
Indirect Bilirubin: 1.4 mg/dL — ABNORMAL HIGH (ref 0.3–0.9)
Indirect Bilirubin: 1.5 mg/dL — ABNORMAL HIGH (ref 0.3–0.9)
TOTAL PROTEIN: 6.2 g/dL — AB (ref 6.5–8.1)
Total Bilirubin: 2.8 mg/dL — ABNORMAL HIGH (ref 0.3–1.2)
Total Bilirubin: 2.8 mg/dL — ABNORMAL HIGH (ref 0.3–1.2)
Total Protein: 5.2 g/dL — ABNORMAL LOW (ref 6.5–8.1)

## 2018-10-19 LAB — SEDIMENTATION RATE: SED RATE: 1 mm/h (ref 0–16)

## 2018-10-19 LAB — RAPID URINE DRUG SCREEN, HOSP PERFORMED
Amphetamines: NOT DETECTED
Barbiturates: NOT DETECTED
Benzodiazepines: NOT DETECTED
Cocaine: NOT DETECTED
Opiates: NOT DETECTED
TETRAHYDROCANNABINOL: NOT DETECTED

## 2018-10-19 LAB — CBC WITH DIFFERENTIAL/PLATELET
Abs Immature Granulocytes: 0 10*3/uL (ref 0.00–0.07)
Band Neutrophils: 21 %
Basophils Absolute: 0 10*3/uL (ref 0.0–0.1)
Basophils Relative: 0 %
Eosinophils Absolute: 0 10*3/uL (ref 0.0–0.5)
Eosinophils Relative: 0 %
HCT: 41 % (ref 39.0–52.0)
Hemoglobin: 13.7 g/dL (ref 13.0–17.0)
Lymphocytes Relative: 18 %
Lymphs Abs: 1.4 10*3/uL (ref 0.7–4.0)
MCH: 29.3 pg (ref 26.0–34.0)
MCHC: 33.4 g/dL (ref 30.0–36.0)
MCV: 87.6 fL (ref 80.0–100.0)
Monocytes Absolute: 0.8 10*3/uL (ref 0.1–1.0)
Monocytes Relative: 10 %
NEUTROS ABS: 5.4 10*3/uL (ref 1.7–7.7)
Neutrophils Relative %: 51 %
Platelets: 135 10*3/uL — ABNORMAL LOW (ref 150–400)
RBC: 4.68 MIL/uL (ref 4.22–5.81)
RDW: 12.9 % (ref 11.5–15.5)
WBC: 7.5 10*3/uL (ref 4.0–10.5)
nRBC: 0 % (ref 0.0–0.2)
nRBC: 0 /100 WBC

## 2018-10-19 LAB — BASIC METABOLIC PANEL
Anion gap: 11 (ref 5–15)
BUN: 13 mg/dL (ref 6–20)
CHLORIDE: 106 mmol/L (ref 98–111)
CO2: 21 mmol/L — ABNORMAL LOW (ref 22–32)
CREATININE: 1.34 mg/dL — AB (ref 0.61–1.24)
Calcium: 8.2 mg/dL — ABNORMAL LOW (ref 8.9–10.3)
GFR calc Af Amer: >60 mL/min (ref >60)
GFR calc non Af Amer: >60 mL/min (ref >60)
Glucose, Bld: 144 mg/dL — ABNORMAL HIGH (ref 70–99)
Potassium: 3.2 mmol/L — ABNORMAL LOW (ref 3.5–5.1)
Sodium: 138 mmol/L (ref 135–145)

## 2018-10-19 LAB — PROTIME-INR
INR: 1.2
Prothrombin Time: 15.1 seconds (ref 11.4–15.2)

## 2018-10-19 LAB — CK: Total CK: 168 U/L (ref 49–397)

## 2018-10-19 LAB — ACETAMINOPHEN LEVEL: Acetaminophen (Tylenol), Serum: <10 ug/mL — ABNORMAL LOW (ref 10–30)

## 2018-10-19 LAB — HIV ANTIBODY (ROUTINE TESTING W REFLEX): HIV Screen 4th Generation wRfx: NONREACTIVE

## 2018-10-19 MED ORDER — FAMOTIDINE IN NACL 20-0.9 MG/50ML-% IV SOLN
20.0000 mg | Freq: Once | INTRAVENOUS | Status: DC
  Filled 2018-10-19: qty 50

## 2018-10-19 MED ORDER — ACETYLCYSTEINE 20 % IN SOLN
6000.0000 mg | RESPIRATORY_TRACT | Status: DC
  Administered 2018-10-19: 6000 mg via ORAL
  Filled 2018-10-19 (×5): qty 30

## 2018-10-19 MED ORDER — ONDANSETRON HCL 4 MG/2ML IJ SOLN
4.0000 mg | Freq: Four times a day (QID) | INTRAMUSCULAR | Status: DC | PRN
  Administered 2018-10-19 – 2018-10-22 (×5): 4 mg via INTRAVENOUS
  Filled 2018-10-19 (×6): qty 2

## 2018-10-19 MED ORDER — DIPHENHYDRAMINE HCL 50 MG/ML IJ SOLN
25.0000 mg | Freq: Once | INTRAMUSCULAR | Status: AC
  Administered 2018-10-19: 25 mg via INTRAVENOUS
  Filled 2018-10-19: qty 1

## 2018-10-19 MED ORDER — DIPHENHYDRAMINE HCL 50 MG/ML IJ SOLN
25.0000 mg | Freq: Four times a day (QID) | INTRAMUSCULAR | Status: DC | PRN
  Administered 2018-10-24: 25 mg via INTRAVENOUS
  Filled 2018-10-19: qty 1

## 2018-10-19 MED ORDER — FAMOTIDINE IN NACL 20-0.9 MG/50ML-% IV SOLN
20.0000 mg | Freq: Two times a day (BID) | INTRAVENOUS | Status: DC
  Administered 2018-10-19 – 2018-10-20 (×4): 20 mg via INTRAVENOUS
  Filled 2018-10-19 (×4): qty 50

## 2018-10-19 MED ORDER — ONDANSETRON HCL 4 MG/2ML IJ SOLN
4.0000 mg | Freq: Once | INTRAMUSCULAR | Status: AC
  Administered 2018-10-19: 4 mg via INTRAVENOUS
  Filled 2018-10-19: qty 2

## 2018-10-19 MED ORDER — FAMOTIDINE IN NACL 20-0.9 MG/50ML-% IV SOLN
20.0000 mg | Freq: Once | INTRAVENOUS | Status: AC
  Administered 2018-10-19: 20 mg via INTRAVENOUS

## 2018-10-19 MED ORDER — KETOROLAC TROMETHAMINE 30 MG/ML IJ SOLN
30.0000 mg | Freq: Once | INTRAMUSCULAR | Status: AC
  Administered 2018-10-19: 30 mg via INTRAVENOUS
  Filled 2018-10-19: qty 1

## 2018-10-19 MED ORDER — LACTATED RINGERS IV BOLUS
1000.0000 mL | Freq: Once | INTRAVENOUS | Status: AC
  Administered 2018-10-19: 1000 mL via INTRAVENOUS

## 2018-10-19 MED ORDER — FENTANYL CITRATE (PF) 100 MCG/2ML IJ SOLN
25.0000 ug | INTRAMUSCULAR | Status: DC | PRN
  Administered 2018-10-19 – 2018-10-21 (×7): 25 ug via INTRAVENOUS
  Administered 2018-10-21: 50 ug via INTRAVENOUS
  Filled 2018-10-19 (×9): qty 2

## 2018-10-19 MED ORDER — POTASSIUM CHLORIDE CRYS ER 20 MEQ PO TBCR
40.0000 meq | EXTENDED_RELEASE_TABLET | Freq: Once | ORAL | Status: AC
  Administered 2018-10-19: 40 meq via ORAL
  Filled 2018-10-19: qty 2

## 2018-10-19 MED ORDER — TRAMADOL HCL 50 MG PO TABS
50.0000 mg | ORAL_TABLET | Freq: Four times a day (QID) | ORAL | Status: DC | PRN
  Administered 2018-10-19 – 2018-10-20 (×2): 50 mg via ORAL
  Filled 2018-10-19 (×3): qty 1

## 2018-10-19 MED ORDER — ONDANSETRON HCL 4 MG PO TABS: 4.0000 mg | ORAL_TABLET | Freq: Four times a day (QID) | ORAL | Status: DC | PRN

## 2018-10-19 MED ORDER — PROCHLORPERAZINE EDISYLATE 10 MG/2ML IJ SOLN: 10.0000 mg | Freq: Once | INTRAMUSCULAR | Status: DC

## 2018-10-19 MED ORDER — SODIUM CHLORIDE 0.9 % IV SOLN
INTRAVENOUS | Status: DC
  Administered 2018-10-19 (×2): via INTRAVENOUS

## 2018-10-19 NOTE — Progress Notes (Signed)
Dr. Lowell Guitar will call the nurse regarding acetylcysteine is to be given or not.

## 2018-10-19 NOTE — Progress Notes (Signed)
Called Dr Lowell Guitar d/t patient on droplet precaution, but with negative respiratory panel. New order received to d/c droplet precautions.

## 2018-10-19 NOTE — Progress Notes (Signed)
Received call from [place] from [employer] for update on patient's symptoms. Call back number received for Poison Control is [phone].

## 2018-10-19 NOTE — Progress Notes (Addendum)
Mr Blake Bradley admitted from ED to [phone] with c/o headache and accidental overdose on ibuprofen. Patient stable on arrival, denies c/o, except bad headache. Patient alert and oriented, MAE. Assessment in progress.

## 2018-10-19 NOTE — Progress Notes (Addendum)
PROGRESS NOTE    Blake Bradley  ZOX:[id number] DOB: 02-18-1991 DOA: 10/18/2018 PCP: Patient, No Pcp Per  Brief Narrative:  Blake Bradley is a 28 y.o. male with history of postconcussion syndrome previously presently not on any medication prior to recent symptoms particularly of occipital headache flank pain subjective feeling of fever chills which has been ongoing for last about a week.  Patient also has been having some epigastric discomfort.  Patient states he has been taking ibuprofen almost thousand milligrams every 2 hours for last 2 days and also NyQuil.  Not sure exactly how much of NyQuil he took.  NyQuil contains acetaminophen.  Patient denies drinking alcohol or using any drugs or tobacco abuse.  ED Course: In the ER patient's LFTs were elevated with alkaline phosphatase of 254 AST of 161 ALT 198 total bilirubin 2.5 direct was 1.2 and sonogram of the right upper nose unremarkable.  UA was unremarkable.  Lipase was negative.  Patient had a temperature of 99 F.  ER physician had contacted poison control and since patient has elevated LFTs and had taken some NyQuil which initially was reported that he was taking multiple doses Poison control advised to start patient on acetylcysteine.  Shortly after giving Reglan patient developed some hives.  For which patient was given Benadryl IV.  Following which hives resolved.  Then patient was placed on IV acetylcysteine and few hours later patient had some nausea for which Zofran was given along with Toradol.  Half an hour later patient started having hives again which at this time was diffuse involving the whole body but no involvement of the oral mucosa or no wheezing.  Another dose of Benadryl was given along with IV Pepcid.  Patient was allergic to steroid which Leaves him with hives so was not given.  Poison control was contacted and at this time poison control advised to change IV acetylcysteine to p.o. acetylcysteine which has less potential for  allergies.  CT head without contrast did not show anything acute.  Patient appears nonfocal.  Patient admitted for further work-up.  Respiratory viral panel also has been sent chest x-ray is pending.   Assessment & Plan:   Principal Problem:   Acute hepatitis Active Problems:   Headache, unspecified headache type   Headache  Myalgias  Cough  Flank Pain:  Pt with constellation of sx that sound c/w viral illness.  He continues to c/o HA at the time of my evaluation.  Afebrile, but with mildly elevated temps. - With elevated liver enzymes, follow acute hepatitis panel, HIV, hepatitis panel, EBV, CMV  - negative RVP - follow blood cultures - If he continues to have HA, would consider LP (consider discussing with neurology).  Head CT without acute intracranial abnormality. - consider abdominal imaging  - Pt with several allergies, try tramadol for pain  Elevated Liver Enzymes  Elevated bilirubin  Concern for APAP and Ibuprofen overdose: pt was taking 5-6 tablets ibuprofen every 4-6 hours over the past 4-6 days after discussing this with pharmacist.  There was concern earlier that he may have also been overusing dayquil and nyquil, but on further questioning this morning, he tells me he was only taking nyquil (2 tablets) nightly.  He denies any other tylenol containing products or dayquil (discussed this with him multiple times).  Low suspicion for chronic APAP overdose with this history, will d/c NAC.  Discussed with poison control. Elevated liver enzymes may be related to viral illness above Follow acute hepatitis panel, CMV, EBV Continue  to follow Negative APAP/salicylates RUQ US negative  Acute Kidney Injury: creatinine elevated to 1.34 from 1.13 at presentation.  Bolus, continue IVF UA is bland Follow renal US if not improving tomorrow  Allergic Reaction: see note from Dr. Preston FleetingGlick.  After receiving reglan, benadryl, toradol, and zofran.  He developed erythroderma and pruritis.   Improved with sx treatment.  Toradol added to allergy list  DVT prophylaxis: SCD Code Status: full  Family Communication: father at bedside Disposition Plan: pending imrpovement   Consultants:   none  Procedures:   none  Antimicrobials:  Anti-infectives (From admission, onward)   None         Subjective: Notes HA, cough, nausea x1 week He was initially taking 2-3 ibuprfen, then after speaking to pharmacy increased to 5-6 He notes he was taking 2 nyquil nightly.  Denies other APAP or dayquil or any other meds. Notes pain in R ear.  No travel. No sick contacts. No bug/tick bites.  Objective: Vitals:   10/19/18 1330 10/19/18 1400 10/19/18 1430 10/19/18 1520  BP: 130/72 116/62 134/83 128/74  Pulse: 80 72 90 72  Resp: 18 (!) 22 (!) 33 20  Temp:    98.9 F (37.2 C)  TempSrc:    Oral  SpO2: 100% 100% 100% 100%  Weight:    83.3 kg  Height:    6\' 2"  (1.88 m)    Intake/Output Summary (Last 24 hours) at 10/19/2018 1836 Last data filed at 10/19/2018 1526 Gross per 24 hour  Intake 240 ml  Output 0 ml  Net 240 ml   Filed Weights   10/18/18 2115 10/19/18 1520  Weight: 81.6 kg 83.3 kg    Examination:  General exam: Appears calm and comfortable  HEENT: no sinus ttp.  TM's bilaterally appear normal. Respiratory system: Clear to auscultation. Respiratory effort normal. Cardiovascular system: S1 & S2 heard, RRR Gastrointestinal system: Abdomen is nondistended, soft and nontender Central nervous system: Alert and oriented. No focal neurological deficits. Extremities: no LEE Skin: No rashes, lesions or ulcers Psychiatry: Judgement and insight appear normal. Mood & affect appropriate.     Data Reviewed: I have personally reviewed following labs and imaging studies  CBC: Recent Labs  Lab 10/18/18 2110 10/19/18 0514  WBC 6.8 7.5  NEUTROABS  --  5.4  HGB 15.3 13.7  HCT 44.8 41.0  MCV 89.4 87.6  PLT 152 135*   Basic Metabolic Panel: Recent Labs  Lab  10/18/18 2110 10/19/18 0514  NA 138 138  K 4.2 3.2*  CL 107 106  CO2 26 21*  GLUCOSE 113* 144*  BUN 13 13  CREATININE 1.13 1.34*  CALCIUM 8.8* 8.2*   GFR: Estimated Creatinine Clearance: 98 mL/min (A) (by C-G formula based on SCr of 1.34 mg/dL (H)). Liver Function Tests: Recent Labs  Lab 10/18/18 2120 10/19/18 0310 10/19/18 1500  AST 161* 118* 144*  ALT 198* 158* 156*  ALKPHOS 254* 191* 186*  BILITOT 2.5* 2.8* 2.8*  PROT 7.5 6.2* 5.2*  ALBUMIN 4.4 3.1* 2.8*   Recent Labs  Lab 10/18/18 2110  LIPASE 46   No results for input(s): AMMONIA in the last 168 hours. Coagulation Profile: Recent Labs  Lab 10/19/18 0310  INR 1.20   Cardiac Enzymes: Recent Labs  Lab 10/19/18 0514  CKTOTAL 168   BNP (last 3 results) No results for input(s): PROBNP in the last 8760 hours. HbA1C: No results for input(s): HGBA1C in the last 72 hours. CBG: No results for input(s): GLUCAP in the last  168 hours. Lipid Profile: No results for input(s): CHOL, HDL, LDLCALC, TRIG, CHOLHDL, LDLDIRECT in the last 72 hours. Thyroid Function Tests: No results for input(s): TSH, T4TOTAL, FREET4, T3FREE, THYROIDAB in the last 72 hours. Anemia Panel: No results for input(s): VITAMINB12, FOLATE, FERRITIN, TIBC, IRON, RETICCTPCT in the last 72 hours. Sepsis Labs: No results for input(s): PROCALCITON, LATICACIDVEN in the last 168 hours.  Recent Results (from the past 240 hour(s))  Respiratory Panel by PCR     Status: None   Collection Time: 10/19/18  4:44 AM  Result Value Ref Range Status   Adenovirus NOT DETECTED NOT DETECTED Final   Coronavirus 229E NOT DETECTED NOT DETECTED Final   Coronavirus HKU1 NOT DETECTED NOT DETECTED Final   Coronavirus NL63 NOT DETECTED NOT DETECTED Final   Coronavirus OC43 NOT DETECTED NOT DETECTED Final   Metapneumovirus NOT DETECTED NOT DETECTED Final   Rhinovirus / Enterovirus NOT DETECTED NOT DETECTED Final   Influenza A NOT DETECTED NOT DETECTED Final    Influenza B NOT DETECTED NOT DETECTED Final   Parainfluenza Virus 1 NOT DETECTED NOT DETECTED Final   Parainfluenza Virus 2 NOT DETECTED NOT DETECTED Final   Parainfluenza Virus 3 NOT DETECTED NOT DETECTED Final   Parainfluenza Virus 4 NOT DETECTED NOT DETECTED Final   Respiratory Syncytial Virus NOT DETECTED NOT DETECTED Final   Bordetella pertussis NOT DETECTED NOT DETECTED Final   Chlamydophila pneumoniae NOT DETECTED NOT DETECTED Final   Mycoplasma pneumoniae NOT DETECTED NOT DETECTED Final    Comment: Performed at [facility], [address], [place], [place] [address]         Radiology Studies: Dg Chest 2 View  Result Date: 10/19/2018 CLINICAL DATA:  Fever, headache EXAM: CHEST - 2 VIEW COMPARISON:  06/07/2017 FINDINGS: Heart and mediastinal contours are within normal limits. No focal opacities or effusions. No acute bony abnormality. IMPRESSION: No active cardiopulmonary disease. Electronically Signed   By: Charlett Nose M.D.   On: 10/19/2018 10:43   Ct Head Wo Contrast  Result Date: 10/19/2018 CLINICAL DATA:  Headache, acute, severe, worst HA of life EXAM: CT HEAD WITHOUT CONTRAST TECHNIQUE: Contiguous axial images were obtained from the base of the skull through the vertex without intravenous contrast. COMPARISON:  Brain MRI 06/29/2017 FINDINGS: Brain: No intracranial hemorrhage, mass effect, or midline shift. No hydrocephalus. The basilar cisterns are patent. No evidence of territorial infarct or acute ischemia. No extra-axial or intracranial fluid collection. Vascular: No hyperdense vessel. Skull: No fracture or focal lesion. Sinuses/Orbits: Mucosal thickening of the ethmoid air cells and right side of sphenoid sinus. Small fluid level in right maxillary sinus. Mucous retention cyst on prior MRI is not included in the field of view. Other: None. IMPRESSION: 1. No acute intracranial abnormality. 2. Mild mucosal thickening of the ethmoid air cells and small fluid level in right  maxillary sinus. Electronically Signed   By: Narda Rutherford M.D.   On: 10/19/2018 02:44   US Abdomen Limited  Result Date: 10/19/2018 CLINICAL DATA:  Elevated LFT EXAM: ULTRASOUND ABDOMEN LIMITED RIGHT UPPER QUADRANT COMPARISON:  CT 10/27/2016 FINDINGS: Gallbladder: No gallstones or wall thickening visualized. No sonographic Murphy sign noted by sonographer. Common bile duct: Diameter: 2.7 mm Liver: No focal lesion identified. Within normal limits in parenchymal echogenicity. Portal vein is patent on color Doppler imaging with normal direction of blood flow towards the liver. IMPRESSION: Negative right upper quadrant abdominal ultrasound Electronically Signed   By: Jasmine Pang M.D.   On: 10/19/2018  00:00        Scheduled Meds: . prochlorperazine  10 mg Intravenous Once   Continuous Infusions: . sodium chloride 125 mL/hr at 10/19/18 1004  . famotidine (PEPCID) IV Stopped (10/19/18 0149)  . famotidine (PEPCID) IV Stopped (10/19/18 1050)     LOS: 1 day    Time spent: over 30 min    Lacretia Nicks, MD [facility] Pager [phone]  If 7PM-7AM, please contact night-coverage [email or web address] Password [facility] 10/19/2018, 6:36 PM

## 2018-10-19 NOTE — ED Provider Notes (Addendum)
Patient developed generalized erythema and itching following administration of metoclopramide, diphenhydramine, ketorolac, ondansetron.  On exam, he does appear uncomfortable with rather intense erythroderma but no obvious urticarial lesions.  He is tolerating secretions well and phonation is normal.  There is no wheezing and no stridor.  He is given additional diphenhydramine as well as famotidine.  Suspect allergic reaction to ketorolac.  He had complete resolution of symptoms following above-noted treatment.   Dione Booze, MD 10/19/18 0131    Dione Booze, MD 10/19/18 [id number]

## 2018-10-19 NOTE — ED Notes (Signed)
Hospitalist Dr. Lowell Guitar bedside.
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
## 2018-10-19 NOTE — ED Notes (Signed)
Attempted to get pt to drink Mucomyst multiple times. States it "tastes too bad" and is making him nauseous. Educated pt on importance of taking

## 2018-10-19 NOTE — ED Notes (Signed)
bfast tray ordered 

## 2018-10-20 ENCOUNTER — Encounter (HOSPITAL_COMMUNITY): Payer: Self-pay | Admitting: Internal Medicine

## 2018-10-20 DIAGNOSIS — Z888 Allergy status to other drugs, medicaments and biological substances status: Secondary | ICD-10-CM

## 2018-10-20 DIAGNOSIS — R51 Headache: Secondary | ICD-10-CM

## 2018-10-20 DIAGNOSIS — B349 Viral infection, unspecified: Secondary | ICD-10-CM

## 2018-10-20 DIAGNOSIS — Z885 Allergy status to narcotic agent status: Secondary | ICD-10-CM

## 2018-10-20 DIAGNOSIS — R7989 Other specified abnormal findings of blood chemistry: Secondary | ICD-10-CM

## 2018-10-20 DIAGNOSIS — K589 Irritable bowel syndrome without diarrhea: Secondary | ICD-10-CM

## 2018-10-20 DIAGNOSIS — R509 Fever, unspecified: Secondary | ICD-10-CM

## 2018-10-20 DIAGNOSIS — R748 Abnormal levels of other serum enzymes: Secondary | ICD-10-CM

## 2018-10-20 DIAGNOSIS — E876 Hypokalemia: Secondary | ICD-10-CM

## 2018-10-20 DIAGNOSIS — F319 Bipolar disorder, unspecified: Secondary | ICD-10-CM

## 2018-10-20 DIAGNOSIS — R1011 Right upper quadrant pain: Secondary | ICD-10-CM

## 2018-10-20 DIAGNOSIS — D696 Thrombocytopenia, unspecified: Secondary | ICD-10-CM

## 2018-10-20 DIAGNOSIS — F988 Other specified behavioral and emotional disorders with onset usually occurring in childhood and adolescence: Secondary | ICD-10-CM

## 2018-10-20 LAB — COMPREHENSIVE METABOLIC PANEL
ALT: 206 U/L — ABNORMAL HIGH (ref 0–44)
ANION GAP: 7 (ref 5–15)
AST: 192 U/L — ABNORMAL HIGH (ref 15–41)
Albumin: 2.8 g/dL — ABNORMAL LOW (ref 3.5–5.0)
Alkaline Phosphatase: 196 U/L — ABNORMAL HIGH (ref 38–126)
BUN: 7 mg/dL (ref 6–20)
CO2: 22 mmol/L (ref 22–32)
Calcium: 7.8 mg/dL — ABNORMAL LOW (ref 8.9–10.3)
Chloride: 107 mmol/L (ref 98–111)
Creatinine, Ser: 0.98 mg/dL (ref 0.61–1.24)
GFR calc Af Amer: >60 mL/min (ref >60)
GFR calc non Af Amer: >60 mL/min (ref >60)
Glucose, Bld: 125 mg/dL — ABNORMAL HIGH (ref 70–99)
Potassium: 3.4 mmol/L — ABNORMAL LOW (ref 3.5–5.1)
Sodium: 136 mmol/L (ref 135–145)
Total Bilirubin: 2.8 mg/dL — ABNORMAL HIGH (ref 0.3–1.2)
Total Protein: 5.4 g/dL — ABNORMAL LOW (ref 6.5–8.1)

## 2018-10-20 LAB — CMV DNA, QUANTITATIVE, PCR
CMV DNA Quant: POSITIVE IU/mL
Log10 CMV Qn DNA Pl: UNDETERMINED log10 IU/mL

## 2018-10-20 LAB — CBC
HCT: 38 % — ABNORMAL LOW (ref 39.0–52.0)
Hemoglobin: 13.1 g/dL (ref 13.0–17.0)
MCH: 30.5 pg (ref 26.0–34.0)
MCHC: 34.5 g/dL (ref 30.0–36.0)
MCV: 88.4 fL (ref 80.0–100.0)
PLATELETS: 141 10*3/uL — AB (ref 150–400)
RBC: 4.3 MIL/uL (ref 4.22–5.81)
RDW: 12.8 % (ref 11.5–15.5)
WBC: 7.2 10*3/uL (ref 4.0–10.5)
nRBC: 0 % (ref 0.0–0.2)

## 2018-10-20 LAB — PROTIME-INR
INR: 1.16
PROTHROMBIN TIME: 14.7 s (ref 11.4–15.2)

## 2018-10-20 LAB — HEPATITIS PANEL, ACUTE
HCV Ab: <0.1 s/co ratio (ref 0.0–0.9)
Hep A IgM: NEGATIVE
Hep B C IgM: NEGATIVE
Hepatitis B Surface Ag: NEGATIVE

## 2018-10-20 MED ORDER — BUTALBITAL-APAP-CAFFEINE 50-325-40 MG PO TABS: 1.0000 | ORAL_TABLET | Freq: Once | ORAL | Status: DC

## 2018-10-20 MED ORDER — POTASSIUM CHLORIDE CRYS ER 20 MEQ PO TBCR
40.0000 meq | EXTENDED_RELEASE_TABLET | Freq: Once | ORAL | Status: DC
  Filled 2018-10-20: qty 2

## 2018-10-20 MED ORDER — FENTANYL CITRATE (PF) 100 MCG/2ML IJ SOLN
25.0000 ug | Freq: Once | INTRAMUSCULAR | Status: AC
  Administered 2018-10-20: 25 ug via INTRAVENOUS
  Filled 2018-10-20: qty 2

## 2018-10-20 MED ORDER — SODIUM CHLORIDE 0.9 % IV SOLN
INTRAVENOUS | Status: DC | PRN
  Administered 2018-10-20: 1000 mL via INTRAVENOUS
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
## 2018-10-20 NOTE — Progress Notes (Signed)
PROGRESS NOTE   Blake Bradley  RJJ:[id number]    DOB: [birth year]    DOA: 10/18/2018  PCP: Patient, No Pcp Per   I have briefly reviewed patients previous medical records in [facility].  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  Suspect acute viral illness.  Due to ongoing headaches, concern for aseptic meningitis, ID consulted on 1/8.   Assessment & Plan:   Principal Problem:   Acute hepatitis Active Problems:   Headache   Fever   Suspected acute viral syndrome/headache Infectious work-up thus far not revealing for cause.  Does not appear septic or toxic.  Supportive treatment.   Blood cultures x2 and RSV panel negative. Chest x-ray, CT head and right upper quadrant ultrasound without acute findings. ID input appreciated, low index of suspicion for meningitis and hence no LP at this time.  Abnormal LFTs May be part of the acute viral syndrome.  Initially concern for unintentional acetaminophen overdose but that has been ruled out by history. Hepatis work up has been negative thus far (negative Hep A/BC, HIV; Normal Lipase, ASA, APAP). CMV and EBV tests are pending. LFTs have not changed much in the last 3 days.  Thrombocytopenia Mild, again may be related to acute viral syndrome.  Hypokalemia Replace and follow.  Acute kidney injury Resolved after IV fluids.  Avoid nephrotoxic's.  Allergic reaction Noted by EDP.  After receiving Reglan, Benadryl, Toradol and Zofran he developed erythroderma and pruritus.  Resolved.  Toradol was added to allergy list.  DVT prophylaxis: SCD Code Status: Full Family  Communication: None at bedside Disposition: DC home pending clinical improvement   Consultants:  ID  Procedures:  None  Antimicrobials:  None   Subjective: Reports ongoing headache, at times severe, radiating to neck and mid back.  No photophobia.  This is unlike his usual migraine headaches.  No nausea or vomiting or visual symptoms.  Also reports generalized aches.  ROS: As above, otherwise negative.  Objective:  Vitals:   10/19/18 2115 10/19/18 2251 10/20/18 0442 10/20/18 1002  BP: 140/78 125/66 133/72 138/83  Pulse: 80 72 (!) 104 86  Resp: 20  20 18   Temp: 99.9 F (37.7 C) 99 F (37.2 C) (!) 100.5 F (38.1 C)   TempSrc: Oral Oral Oral   SpO2: 100%  98% 99%  Weight: 83.1 kg     Height:        Examination:  General exam: Pleasant young male, well built and nourished lying comfortably supine in bed. Respiratory system: Clear to auscultation. Respiratory effort normal. Cardiovascular system: S1 & S2 heard, RRR. No JVD, murmurs, rubs, gallops or clicks. No pedal edema.  Telemetry personally reviewed: Sinus rhythm. Gastrointestinal system: Abdomen is nondistended, soft and nontender. No organomegaly or masses felt. Normal bowel sounds heard. Central nervous system: Alert and oriented. No focal neurological deficits.  Neck is supple without stiffness. Extremities: Symmetric 5 x 5 power. Skin: No rashes, lesions or ulcers Psychiatry: Judgement and insight appear normal. Mood & affect appropriate.     Data Reviewed: I have personally reviewed following labs and imaging studies  CBC: Recent Labs  Lab 10/18/18 2110 10/19/18 0514 10/20/18 0646  WBC 6.8 7.5 7.2  NEUTROABS  --  5.4  --   HGB 15.3 13.7 13.1  HCT 44.8 41.0 38.0*  MCV 89.4 87.6 88.4  PLT 152 135* 141*   Basic Metabolic Panel: Recent Labs  Lab 10/18/18 2110 10/19/18 0514 10/20/18 0646  NA 138 138 136  K 4.2 3.2* 3.4*  CL 107 106 107  CO2 26 21* 22  GLUCOSE 113* 144* 125*  BUN 13 13 7     CREATININE 1.13 1.34* 0.98  CALCIUM 8.8* 8.2* 7.8*   Liver Function Tests: Recent Labs  Lab 10/18/18 2120 10/19/18 0310 10/19/18 1500 10/20/18 0646  AST 161* 118* 144* 192*  ALT 198* 158* 156* 206*  ALKPHOS 254* 191* 186* 196*  BILITOT 2.5* 2.8* 2.8* 2.8*  PROT 7.5 6.2* 5.2* 5.4*  ALBUMIN 4.4 3.1* 2.8* 2.8*   Coagulation Profile: Recent Labs  Lab 10/19/18 0310 10/20/18 0646  INR 1.20 1.16   Cardiac Enzymes: Recent Labs  Lab 10/19/18 0514  CKTOTAL 168     Recent Results (from the past 240 hour(s))  Culture, blood (routine x 2)     Status: None (Preliminary result)   Collection Time: 10/19/18  3:10 AM  Result Value Ref Range Status   Specimen Description BLOOD LEFT FOREARM  Final   Special Requests   Final    BOTTLES DRAWN AEROBIC AND ANAEROBIC Blood Culture results may not be optimal due to an inadequate volume of blood received in culture bottles   Culture   Final    NO GROWTH 1 DAY Performed at [facility], [address] [place], [place] [address]    Report Status PENDING  Incomplete  Culture, blood (routine x 2)     Status: None (Preliminary result)   Collection Time: 10/19/18  3:15 AM  Result Value Ref Range Status   Specimen Description BLOOD LEFT FOREARM  Final   Special Requests   Final    BOTTLES DRAWN AEROBIC AND ANAEROBIC Blood Culture results may not be optimal due to an inadequate volume of blood received in culture bottles   Culture   Final    NO GROWTH 1 DAY Performed at [facility], [address] [place], [place] [address]    Report Status PENDING  Incomplete  Respiratory Panel by PCR     Status: None   Collection Time: 10/19/18  4:44 AM  Result Value Ref Range Status   Adenovirus NOT DETECTED NOT DETECTED Final   Coronavirus 229E NOT DETECTED NOT DETECTED Final   Coronavirus HKU1 NOT DETECTED NOT DETECTED Final   Coronavirus NL63 NOT DETECTED NOT DETECTED Final   Coronavirus OC43 NOT DETECTED NOT DETECTED Final    Metapneumovirus NOT DETECTED NOT DETECTED Final   Rhinovirus / Enterovirus NOT DETECTED NOT DETECTED Final   Influenza A NOT DETECTED NOT DETECTED Final   Influenza B NOT DETECTED NOT DETECTED Final   Parainfluenza Virus 1 NOT DETECTED NOT DETECTED Final   Parainfluenza Virus 2 NOT DETECTED NOT DETECTED Final   Parainfluenza Virus 3 NOT DETECTED NOT DETECTED Final   Parainfluenza Virus 4 NOT DETECTED NOT DETECTED Final   Respiratory Syncytial Virus NOT DETECTED NOT DETECTED Final   Bordetella pertussis NOT DETECTED NOT DETECTED Final   Chlamydophila pneumoniae NOT DETECTED NOT DETECTED Final   Mycoplasma pneumoniae NOT DETECTED NOT DETECTED Final    Comment: Performed at [facility], [address] [place], [place] [address]         Radiology Studies: Dg Chest 2 View  Result Date: 10/19/2018 CLINICAL DATA:  Fever, headache EXAM: CHEST - 2 VIEW COMPARISON:  06/07/2017 FINDINGS: Heart and mediastinal contours are within normal limits. No focal opacities or effusions. No acute bony abnormality. IMPRESSION: No active cardiopulmonary disease. Electronically Signed   By: Charlett Nose M.D.   On: 10/19/2018 10:43   Ct Head Wo Contrast  Result Date: 10/19/2018 CLINICAL DATA:  Headache, acute, severe, worst HA of life EXAM: CT HEAD WITHOUT CONTRAST TECHNIQUE: Contiguous axial images were obtained from the base of the skull through the vertex without intravenous contrast. COMPARISON:  Brain MRI 06/29/2017 FINDINGS: Brain: No intracranial hemorrhage, mass effect, or midline shift. No hydrocephalus. The basilar cisterns are patent. No evidence of territorial infarct or acute ischemia. No extra-axial or intracranial fluid collection. Vascular: No hyperdense vessel. Skull: No fracture or focal lesion. Sinuses/Orbits: Mucosal thickening of the ethmoid air cells and right side of sphenoid sinus. Small fluid level in right maxillary sinus. Mucous retention cyst on prior MRI is not included in the field  of view. Other: None. IMPRESSION: 1. No acute intracranial abnormality. 2. Mild mucosal thickening of the ethmoid air cells and small fluid level in right maxillary sinus. Electronically Signed   By: Narda Rutherford M.D.   On: 10/19/2018 02:44   US Abdomen Limited  Result Date: 10/19/2018 CLINICAL DATA:  Elevated LFT EXAM: ULTRASOUND ABDOMEN LIMITED RIGHT UPPER QUADRANT COMPARISON:  CT 10/27/2016 FINDINGS: Gallbladder: No gallstones or wall thickening visualized. No sonographic Murphy sign noted by sonographer. Common bile duct: Diameter: 2.7 mm Liver: No focal lesion identified. Within normal limits in parenchymal echogenicity. Portal vein is patent on color Doppler imaging with normal direction of blood flow towards the liver. IMPRESSION: Negative right upper quadrant abdominal ultrasound Electronically Signed   By: Jasmine Pang M.D.   On: 10/19/2018 00:00        Scheduled Meds: . butalbital-acetaminophen-caffeine  1 tablet Oral Once  . prochlorperazine  10 mg Intravenous Once   Continuous Infusions: . sodium chloride 1,000 mL (10/20/18 1021)  . famotidine (PEPCID) IV Stopped (10/19/18 0149)  . famotidine (PEPCID) IV 20 mg (10/20/18 1022)     LOS: 2 days     Marcellus Scott, MD, FACP, [facility]. [facility] Pager [phone]  If 7PM-7AM, please contact night-coverage [email or web address] Password [email or web address] 10/20/2018, 6:04 PM

## 2018-10-21 ENCOUNTER — Encounter (HOSPITAL_COMMUNITY): Payer: Self-pay | Admitting: Anesthesiology

## 2018-10-21 DIAGNOSIS — R112 Nausea with vomiting, unspecified: Secondary | ICD-10-CM

## 2018-10-21 DIAGNOSIS — M791 Myalgia, unspecified site: Secondary | ICD-10-CM

## 2018-10-21 DIAGNOSIS — M549 Dorsalgia, unspecified: Secondary | ICD-10-CM

## 2018-10-21 DIAGNOSIS — R630 Anorexia: Secondary | ICD-10-CM

## 2018-10-21 DIAGNOSIS — Z9049 Acquired absence of other specified parts of digestive tract: Secondary | ICD-10-CM

## 2018-10-21 DIAGNOSIS — B179 Acute viral hepatitis, unspecified: Secondary | ICD-10-CM

## 2018-10-21 DIAGNOSIS — K920 Hematemesis: Secondary | ICD-10-CM

## 2018-10-21 DIAGNOSIS — F419 Anxiety disorder, unspecified: Secondary | ICD-10-CM

## 2018-10-21 LAB — CBC
HCT: 42.2 % (ref 39.0–52.0)
Hemoglobin: 14 g/dL (ref 13.0–17.0)
MCH: 29.2 pg (ref 26.0–34.0)
MCHC: 33.2 g/dL (ref 30.0–36.0)
MCV: 87.9 fL (ref 80.0–100.0)
Platelets: 109 10*3/uL — ABNORMAL LOW (ref 150–400)
RBC: 4.8 MIL/uL (ref 4.22–5.81)
RDW: 12.7 % (ref 11.5–15.5)
WBC: 6.2 10*3/uL (ref 4.0–10.5)
nRBC: 0 % (ref 0.0–0.2)

## 2018-10-21 LAB — COMPREHENSIVE METABOLIC PANEL
ALT: 325 U/L — ABNORMAL HIGH (ref 0–44)
AST: 333 U/L — ABNORMAL HIGH (ref 15–41)
Albumin: 2.8 g/dL — ABNORMAL LOW (ref 3.5–5.0)
Alkaline Phosphatase: 230 U/L — ABNORMAL HIGH (ref 38–126)
Anion gap: 8 (ref 5–15)
BUN: 9 mg/dL (ref 6–20)
CO2: 22 mmol/L (ref 22–32)
Calcium: 8.3 mg/dL — ABNORMAL LOW (ref 8.9–10.3)
Chloride: 106 mmol/L (ref 98–111)
Creatinine, Ser: 0.93 mg/dL (ref 0.61–1.24)
GFR calc Af Amer: >60 mL/min (ref >60)
GFR calc non Af Amer: >60 mL/min (ref >60)
Glucose, Bld: 84 mg/dL (ref 70–99)
Potassium: 3.8 mmol/L (ref 3.5–5.1)
Sodium: 136 mmol/L (ref 135–145)
Total Bilirubin: 3.2 mg/dL — ABNORMAL HIGH (ref 0.3–1.2)
Total Protein: 5.4 g/dL — ABNORMAL LOW (ref 6.5–8.1)

## 2018-10-21 MED ORDER — PHENOL 1.4 % MT LIQD
1.0000 | OROMUCOSAL | Status: DC | PRN
  Administered 2018-10-21 – 2018-10-22 (×4): 1 via OROMUCOSAL
  Filled 2018-10-21: qty 177

## 2018-10-21 MED ORDER — OXYCODONE HCL 5 MG PO TABS: 5.0000 mg | ORAL_TABLET | Freq: Four times a day (QID) | ORAL | Status: DC | PRN

## 2018-10-21 MED ORDER — FENTANYL CITRATE (PF) 100 MCG/2ML IJ SOLN
50.0000 ug | INTRAMUSCULAR | Status: DC | PRN
  Administered 2018-10-21 – 2018-10-24 (×17): 50 ug via INTRAVENOUS
  Filled 2018-10-21 (×17): qty 2

## 2018-10-21 MED ORDER — PANTOPRAZOLE SODIUM 40 MG PO TBEC
40.0000 mg | DELAYED_RELEASE_TABLET | Freq: Two times a day (BID) | ORAL | Status: DC
  Administered 2018-10-21 – 2018-10-26 (×10): 40 mg via ORAL
  Filled 2018-10-21 (×10): qty 1

## 2018-10-21 MED ORDER — SENNA 8.6 MG PO TABS
1.0000 | ORAL_TABLET | Freq: Every day | ORAL | Status: DC | PRN
  Administered 2018-10-25: 8.6 mg via ORAL
  Filled 2018-10-21: qty 1

## 2018-10-21 MED ORDER — PANTOPRAZOLE SODIUM 40 MG IV SOLR
40.0000 mg | Freq: Two times a day (BID) | INTRAVENOUS | Status: DC
  Administered 2018-10-21: 40 mg via INTRAVENOUS
  Filled 2018-10-21: qty 40

## 2018-10-21 MED ORDER — SENNOSIDES-DOCUSATE SODIUM 8.6-50 MG PO TABS
1.0000 | ORAL_TABLET | Freq: Once | ORAL | Status: AC
  Administered 2018-10-22: 1 via ORAL
  Filled 2018-10-21: qty 1

## 2018-10-21 MED ORDER — HYDROMORPHONE HCL 1 MG/ML IJ SOLN
0.5000 mg | INTRAMUSCULAR | Status: DC | PRN
  Filled 2018-10-21: qty 1

## 2018-10-21 NOTE — Progress Notes (Addendum)
Patient was taking PRN fentanyl q2 and states his pain was improving. At approximately 2pm when his pain was readdressed, he states he did not want another dose of pain medication. When Mr.Kuzel woke up, he called for pain medication and states his pain was unbearable, PRN dose was given. Discussed with him that taking pain medication at regular intervals before pain became unbearable would help [place] to better control his headaches. At 6:40, his father called stating Neilan wanted to be taken to [facility] with Lottie Rater who feels his pain is uncontrolled and that is why he wanted to leave. Paged MD.  MD called and discussed pain control at length, Mr.Coste stated, to both this RN and MD, that he has taken dilaudid and oxycodone in the past, and has had better pain control with these medications. Received verbal orders for P.O. oxycodone and IV dilaudid. Patient confirmed he has tolerated these well despite listed allergies.

## 2018-10-21 NOTE — Progress Notes (Signed)
PROGRESS NOTE   Blake Bradley  MMH:[id number]    DOB: May 14, 1991    DOA: 10/18/2018  PCP: Patient, No Pcp Per   I have briefly reviewed patients previous medical records in [facility].  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  Suspect acute viral illness.  Due to ongoing headaches, concern for aseptic meningitis, ID consulted on 1/8.  Patient had an episode of coffee-ground emesis overnight 1/8.  [facility] consulted.   Assessment & Plan:   Principal Problem:   Acute hepatitis Active Problems:   Headache   Fever   Suspected acute viral syndrome/headache/acute hepatitis Infectious work-up thus far not revealing for cause.  Does not appear septic or toxic.  Supportive treatment.   Blood cultures x2 and RSV panel negative. Chest x-ray, CT head and right upper quadrant ultrasound without acute findings. ID input appreciated, low index of suspicion for meningitis and hence no LP at this time. CMV PCR weakly positive but does not mean it is the cause of his acute illness. EBV PCR, HIV viral load, CMV and EBV serologies pending. LFTs worse today with AST and ALT in the 300s.  Acute hepatitis May be part of the acute viral syndrome.  Initially concern for unintentional acetaminophen overdose but that has been ruled out by history. Hepatis work up has been negative thus far (negative Hep A/BC, HIV; Normal Lipase, ASA, APAP). CMV and EBV tests are pending. LFTs worse today than yesterday. [facility] consulted and input appreciated.  PT/INR normal.  No significant alcohol use history. Follow CMP  closely.  Thrombocytopenia Mild but counts gradually dropping, down to 109 today.  Monitor daily CBCs.  Hypokalemia Replaced.  Acute kidney injury Resolved after IV fluids.  Avoid nephrotoxic's.  Allergic reaction Noted by EDP.  After receiving Reglan, Benadryl, Toradol and Zofran he developed erythroderma and pruritus.  Resolved.  Toradol was added to allergy list.  Suspected coffee-ground emesis Concern for upper GI bleed due to history of recent NSAID use, prior history of PUD.  Change diet to clear liquids, started IV PPI, avoid NSAIDs. [facility] consulted, suspect gastritis from NSAIDs and recommend switching to oral Protonix twice daily and full liquid diet.  Monitor closely.  DVT prophylaxis: SCD Code Status: Full Family Communication: None at bedside Disposition: DC home pending clinical improvement   Consultants:  ID [facility].  Procedures:  None  Antimicrobials:  None   Subjective: Reports one episode of "dark" emesis without red blood overnight.  Suprapubic area intermittent mild pain.  Had some nausea last night which is improved.  Last BM 2 to 3 days ago was normal in color.  Ongoing headache but better last night after increased dose of fentanyl and was able to sleep for some time.  ROS: As above, otherwise negative.  Objective:  Vitals:   10/20/18 2213 10/21/18 0532 10/21/18 0952 10/21/18 1707  BP: 131/76 126/80 (!) 154/67 136/79  Pulse: 66 (!) 51 (!) 57 71  Resp: 16 16 18 18   Temp: 99.2 F (37.3 C) 98.7 F (37.1 C) 98.8 F (37.1 C) 99.1 F (37.3 C)  TempSrc: Oral Oral Oral Oral  SpO2: 100% 100% 100% 100%  Weight:  Height:        Examination:  General exam: Pleasant young male, well built and nourished lying comfortably supine in bed. Respiratory system: Clear to auscultation. Respiratory effort normal. Cardiovascular system: S1 & S2 heard, RRR. No JVD, murmurs, rubs, gallops or clicks. No pedal edema.  Gastrointestinal system:  Abdomen is nondistended, soft and nontender. No organomegaly or masses felt. Normal bowel sounds heard. Central nervous system: Alert and oriented. No focal neurological deficits.  Neck remains supple without stiffness. Extremities: Symmetric 5 x 5 power. Skin: No rashes, lesions or ulcers Psychiatry: Judgement and insight appear normal. Mood & affect appropriate.     Data Reviewed: I have personally reviewed following labs and imaging studies  CBC: Recent Labs  Lab 10/18/18 2110 10/19/18 0514 10/20/18 0646 10/21/18 0737  WBC 6.8 7.5 7.2 6.2  NEUTROABS  --  5.4  --   --   HGB 15.3 13.7 13.1 14.0  HCT 44.8 41.0 38.0* 42.2  MCV 89.4 87.6 88.4 87.9  PLT 152 135* 141* 109*   Basic Metabolic Panel: Recent Labs  Lab 10/18/18 2110 10/19/18 0514 10/20/18 0646 10/21/18 0737  NA 138 138 136 136  K 4.2 3.2* 3.4* 3.8  CL 107 106 107 106  CO2 26 21* 22 22  GLUCOSE 113* 144* 125* 84  BUN 13 13 7 9   CREATININE 1.13 1.34* 0.98 0.93  CALCIUM 8.8* 8.2* 7.8* 8.3*   Liver Function Tests: Recent Labs  Lab 10/18/18 2120 10/19/18 0310 10/19/18 1500 10/20/18 0646 10/21/18 0737  AST 161* 118* 144* 192* 333*  ALT 198* 158* 156* 206* 325*  ALKPHOS 254* 191* 186* 196* 230*  BILITOT 2.5* 2.8* 2.8* 2.8* 3.2*  PROT 7.5 6.2* 5.2* 5.4* 5.4*  ALBUMIN 4.4 3.1* 2.8* 2.8* 2.8*   Coagulation Profile: Recent Labs  Lab 10/19/18 0310 10/20/18 0646  INR 1.20 1.16   Cardiac Enzymes: Recent Labs  Lab 10/19/18 0514  CKTOTAL 168     Recent Results (from the past 240 hour(s))  Culture, blood (routine x 2)     Status: None (Preliminary result)   Collection Time: 10/19/18  3:10 AM  Result Value Ref Range Status   Specimen Description BLOOD LEFT FOREARM  Final   Special Requests   Final    BOTTLES DRAWN AEROBIC AND ANAEROBIC Blood Culture results may not be optimal due to an inadequate volume of blood received in culture bottles   Culture   Final    NO GROWTH 2 DAYS Performed at [facility], [address] [place], [place] [address]    Report Status PENDING  Incomplete  Culture, blood (routine x 2)     Status: None (Preliminary result)   Collection Time: 10/19/18  3:15 AM  Result Value Ref Range Status   Specimen Description BLOOD LEFT FOREARM  Final   Special Requests   Final    BOTTLES DRAWN AEROBIC AND ANAEROBIC Blood Culture results may not be optimal due to an inadequate volume of blood received in culture bottles   Culture   Final    NO GROWTH 2 DAYS Performed at [facility], [address] [place], [place] [address]    Report Status PENDING  Incomplete  Respiratory Panel by PCR     Status: None   Collection Time: 10/19/18  4:44 AM  Result Value Ref Range Status   Adenovirus NOT DETECTED NOT DETECTED Final   Coronavirus 229E NOT DETECTED NOT DETECTED Final   Coronavirus HKU1 NOT DETECTED  NOT DETECTED Final   Coronavirus NL63 NOT DETECTED NOT DETECTED Final   Coronavirus OC43 NOT DETECTED NOT DETECTED Final   Metapneumovirus NOT DETECTED NOT DETECTED Final   Rhinovirus / Enterovirus NOT DETECTED NOT DETECTED Final   Influenza A NOT DETECTED NOT DETECTED Final   Influenza B NOT DETECTED NOT DETECTED Final   Parainfluenza Virus 1 NOT DETECTED NOT DETECTED Final   Parainfluenza Virus 2 NOT DETECTED NOT DETECTED Final   Parainfluenza Virus 3 NOT DETECTED NOT DETECTED Final   Parainfluenza Virus 4 NOT DETECTED NOT DETECTED Final   Respiratory Syncytial Virus NOT DETECTED NOT DETECTED Final   Bordetella pertussis NOT DETECTED NOT DETECTED Final   Chlamydophila pneumoniae NOT DETECTED NOT DETECTED Final   Mycoplasma pneumoniae NOT DETECTED NOT DETECTED Final    Comment: Performed at [facility], [address] [place], [place] [address]         Radiology Studies: No results found.      Scheduled Meds: . pantoprazole  40 mg Oral BID  . potassium chloride  40 mEq Oral Once  . senna-docusate  1 tablet Oral Once   Continuous  Infusions: . sodium chloride 1,000 mL (10/20/18 1021)     LOS: 3 days     Marcellus ScottAnand Sari Cogan, MD, FACP, [facility]. [facility] Pager [phone]  If 7PM-7AM, please contact night-coverage [email or web address] Password [email or web address] 10/21/2018, 5:14 PM
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
## 2018-10-21 NOTE — Progress Notes (Signed)
Patient c/o a sore throat, chloraseptic spray ordered and night shift nurse made aware.  Also states he had one episode of coughing up bloody sputum, if this happens again he will save sputum for the nurse to see.  Night shift nurse will page night coverage

## 2018-10-21 NOTE — Progress Notes (Addendum)
[facility]  Date of Admission:  10/18/2018                         Total days of antibiotics 0                                                                                                                                                  Assessment: Blake Bradley is a 28 year old male with a history of post concussive syndrome, anxiety and appendicitis (s/p appendectomy in 2015). Who presented with two weeks of body aches, worsening headache, and subjective fevers and chills. Noted to have elevated LFTs and low grade fever while admitted. Initial work up negative for Hep A/B/C, HIV Ab, Pancreatitis, drug induced liver injury, or alcoholic liver disease. Blood cultures with NGTD. Low suspicion for meningitis at this time given chronic headaches, lack of neck stiffness, and hepatitis as likely alternate source for systemic symptoms. Has remain afebrile except single temp of 100.5 on 1/8. No leukocytosis. Platelets downtrending, currently 109. LFTs trending up today AST 333, ALT 325, ALP 230. CMV weakly positive by PCR, this may represent inactive prior infection, will need to check IgM.  Plan:   1. Elevated Liver Function Tests - Negative Hepatitis A/B/C, HIV Ab - CMV PCR weekly positive, possibly inactive infection - EBV PCR, HIV Viral load pending - CMV and EBV serologies added - Monitor LFTs, Platlets  2. Headache - Continue to monitor - Do not reccomend LP, low suspicion for meningitis  Principal Problem:   Acute hepatitis Active Problems:   Headache   Fever   Scheduled Meds: . potassium chloride  40 mEq Oral Once   Continuous Infusions: . sodium chloride 1,000 mL (10/20/18 1021)  . famotidine (PEPCID) IV 20 mg (10/20/18 2136)   PRN Meds:.sodium chloride, diphenhydrAMINE, fentaNYL (SUBLIMAZE) injection, ondansetron **OR** ondansetron (ZOFRAN) IV, traMADol   SUBJECTIVE: Blake Bradley states that he continue to have headaches as well as neck  and back pain. The pain was relieved for at least several hours last night with increased pain medication and he was able to sleep, but since returned.   Review of Systems: Review of Systems  Constitutional: Negative for chills and fever.  HENT: Negative for hearing loss.   Eyes: Negative for blurred vision.  Respiratory: Negative for shortness of breath.   Cardiovascular: Negative for chest pain.  Gastrointestinal: Positive for nausea and vomiting.  Musculoskeletal: Positive for back pain, myalgias and neck pain.  Neurological: Positive for headaches.    Allergies  Allergen Reactions  . Codeine Anaphylaxis  . Ketorolac Rash    Possible rash, itching. Occurred with other meds. Tolerates ibuprofen  . Morphine And Related Hives  . Prednisone Hives    OBJECTIVE: Vitals:   10/20/18 1002  10/20/18 1800 10/20/18 2213 10/21/18 0532  BP: 138/83 117/63 131/76 126/80  Pulse: 86 89 66 (!) 51  Resp: 18 19 16 16   Temp:  98.1 F (36.7 C) 99.2 F (37.3 C) 98.7 F (37.1 C)  TempSrc:  Oral Oral Oral  SpO2: 99% 98% 100% 100%  Weight:      Height:       Body mass index is 23.52 kg/m.  Physical Exam Constitutional:      General: He is not in acute distress.    Appearance: He is well-developed.  Eyes:     General: No scleral icterus.    Extraocular Movements: Extraocular movements intact.  Neck:     Musculoskeletal: Normal range of motion. No neck rigidity.  Cardiovascular:     Rate and Rhythm: Normal rate and regular rhythm.     Pulses: Normal pulses.     Heart sounds: Normal heart sounds.  Pulmonary:     Effort: Pulmonary effort is normal. No respiratory distress.     Breath sounds: Normal breath sounds.  Abdominal:     General: Bowel sounds are normal. There is no distension.     Palpations: Abdomen is soft.     Tenderness: There is no abdominal tenderness.  Musculoskeletal:        General: No swelling.     Comments: Tenderness of cervical and lumbar paraspinal musculature   Skin:    General: Skin is warm and dry.  Neurological:     General: No focal deficit present.     Mental Status: He is alert. Mental status is at baseline.    Lab Results Lab Results  Component Value Date   WBC 7.2 10/20/2018   HGB 13.1 10/20/2018   HCT 38.0 (L) 10/20/2018   MCV 88.4 10/20/2018   PLT 141 (L) 10/20/2018    Lab Results  Component Value Date   CREATININE 0.98 10/20/2018   BUN 7 10/20/2018   NA 136 10/20/2018   K 3.4 (L) 10/20/2018   CL 107 10/20/2018   CO2 22 10/20/2018    Lab Results  Component Value Date   ALT 206 (H) 10/20/2018   AST 192 (H) 10/20/2018   ALKPHOS 196 (H) 10/20/2018   BILITOT 2.8 (H) 10/20/2018     Microbiology: Recent Results (from the past 240 hour(s))  Culture, blood (routine x 2)     Status: None (Preliminary result)   Collection Time: 10/19/18  3:10 AM  Result Value Ref Range Status   Specimen Description BLOOD LEFT FOREARM  Final   Special Requests   Final    BOTTLES DRAWN AEROBIC AND ANAEROBIC Blood Culture results may not be optimal due to an inadequate volume of blood received in culture bottles   Culture   Final    NO GROWTH 2 DAYS Performed at [facility], [address] [place], [place] [address]    Report Status PENDING  Incomplete  Culture, blood (routine x 2)     Status: None (Preliminary result)   Collection Time: 10/19/18  3:15 AM  Result Value Ref Range Status   Specimen Description BLOOD LEFT FOREARM  Final   Special Requests   Final    BOTTLES DRAWN AEROBIC AND ANAEROBIC Blood Culture results may not be optimal due to an inadequate volume of blood received in culture bottles   Culture   Final    NO GROWTH 2 DAYS Performed at [facility], [address] [place], [place] [address]  Report Status PENDING  Incomplete  Respiratory Panel by PCR     Status: None   Collection Time: 10/19/18  4:44 AM  Result Value Ref Range Status   Adenovirus NOT DETECTED NOT DETECTED Final   Coronavirus 229E  NOT DETECTED NOT DETECTED Final   Coronavirus HKU1 NOT DETECTED NOT DETECTED Final   Coronavirus NL63 NOT DETECTED NOT DETECTED Final   Coronavirus OC43 NOT DETECTED NOT DETECTED Final   Metapneumovirus NOT DETECTED NOT DETECTED Final   Rhinovirus / Enterovirus NOT DETECTED NOT DETECTED Final   Influenza A NOT DETECTED NOT DETECTED Final   Influenza B NOT DETECTED NOT DETECTED Final   Parainfluenza Virus 1 NOT DETECTED NOT DETECTED Final   Parainfluenza Virus 2 NOT DETECTED NOT DETECTED Final   Parainfluenza Virus 3 NOT DETECTED NOT DETECTED Final   Parainfluenza Virus 4 NOT DETECTED NOT DETECTED Final   Respiratory Syncytial Virus NOT DETECTED NOT DETECTED Final   Bordetella pertussis NOT DETECTED NOT DETECTED Final   Chlamydophila pneumoniae NOT DETECTED NOT DETECTED Final   Mycoplasma pneumoniae NOT DETECTED NOT DETECTED Final    Comment: Performed at [facility], [address] [place], [place] [address]    Beola Cord, MD IM PGY-2 [phone] 10/21/2018, 8:21 AM

## 2018-10-22 ENCOUNTER — Encounter (HOSPITAL_COMMUNITY)
Admission: EM | Disposition: A | Discharge status: Home / Self Care | Payer: Self-pay | Source: Home / Self Care | Attending: Internal Medicine

## 2018-10-22 DIAGNOSIS — J029 Acute pharyngitis, unspecified: Secondary | ICD-10-CM

## 2018-10-22 DIAGNOSIS — B259 Cytomegaloviral disease, unspecified: Secondary | ICD-10-CM

## 2018-10-22 DIAGNOSIS — B2710 Cytomegaloviral mononucleosis without complications: Secondary | ICD-10-CM

## 2018-10-22 DIAGNOSIS — R945 Abnormal results of liver function studies: Secondary | ICD-10-CM

## 2018-10-22 DIAGNOSIS — F0781 Postconcussional syndrome: Secondary | ICD-10-CM

## 2018-10-22 HISTORY — DX: Cytomegaloviral disease, unspecified: B25.9

## 2018-10-22 LAB — EBV AB TO VIRAL CAPSID AG PNL, IGG+IGM
EBV VCA IgG: 24.8 U/mL — ABNORMAL HIGH (ref 0.0–17.9)
EBV VCA IgM: 60.3 U/mL — ABNORMAL HIGH (ref 0.0–35.9)

## 2018-10-22 LAB — CBC WITH DIFFERENTIAL/PLATELET
Abs Immature Granulocytes: 0 10*3/uL (ref 0.00–0.07)
BASOS ABS: 0.1 10*3/uL (ref 0.0–0.1)
Band Neutrophils: 11 %
Basophils Relative: 1 %
EOS PCT: 2 %
Eosinophils Absolute: 0.2 10*3/uL (ref 0.0–0.5)
HCT: 42.7 % (ref 39.0–52.0)
Hemoglobin: 14.6 g/dL (ref 13.0–17.0)
Lymphocytes Relative: 54 %
Lymphs Abs: 4.6 10*3/uL — ABNORMAL HIGH (ref 0.7–4.0)
MCH: 30.3 pg (ref 26.0–34.0)
MCHC: 34.2 g/dL (ref 30.0–36.0)
MCV: 88.6 fL (ref 80.0–100.0)
Monocytes Absolute: 0.7 10*3/uL (ref 0.1–1.0)
Monocytes Relative: 8 %
NEUTROS PCT: 24 %
Neutro Abs: 3 10*3/uL (ref 1.7–7.7)
Platelets: 147 10*3/uL — ABNORMAL LOW (ref 150–400)
RBC: 4.82 MIL/uL (ref 4.22–5.81)
RDW: 13.1 % (ref 11.5–15.5)
WBC: 8.6 10*3/uL (ref 4.0–10.5)
nRBC: 0 % (ref 0.0–0.2)

## 2018-10-22 LAB — HIV-1 RNA QUANT-NO REFLEX-BLD
HIV 1 RNA Quant: <20 copies/mL
LOG10 HIV-1 RNA: UNDETERMINED log10copy/mL

## 2018-10-22 LAB — HEPATIC FUNCTION PANEL
ALT: 466 U/L — ABNORMAL HIGH (ref 0–44)
AST: 366 U/L — ABNORMAL HIGH (ref 15–41)
Albumin: 2.9 g/dL — ABNORMAL LOW (ref 3.5–5.0)
Alkaline Phosphatase: 284 U/L — ABNORMAL HIGH (ref 38–126)
Bilirubin, Direct: 2.1 mg/dL — ABNORMAL HIGH (ref 0.0–0.2)
Indirect Bilirubin: 1.5 mg/dL — ABNORMAL HIGH (ref 0.3–0.9)
Total Bilirubin: 3.6 mg/dL — ABNORMAL HIGH (ref 0.3–1.2)
Total Protein: 5.8 g/dL — ABNORMAL LOW (ref 6.5–8.1)

## 2018-10-22 LAB — CMV IGM: CMV IgM: >240 AU/mL — ABNORMAL HIGH (ref 0.0–29.9)

## 2018-10-22 LAB — EPSTEIN-BARR VIRUS EARLY D ANTIGEN ANTIBODY, IGG: EBV Early Antigen Ab, IgG: 19.1 U/mL — ABNORMAL HIGH (ref 0.0–8.9)

## 2018-10-22 LAB — EPSTEIN-BARR VIRUS NUCLEAR ANTIGEN ANTIBODY, IGG: EBV NA IgG: <18 U/mL (ref 0.0–17.9)

## 2018-10-22 SURGERY — ESOPHAGOGASTRODUODENOSCOPY (EGD) WITH PROPOFOL
Anesthesia: Monitor Anesthesia Care

## 2018-10-22 MED ORDER — OXYCODONE HCL 5 MG PO TABS
5.0000 mg | ORAL_TABLET | Freq: Three times a day (TID) | ORAL | Status: DC
  Administered 2018-10-22 – 2018-10-26 (×13): 5 mg via ORAL
  Filled 2018-10-22 (×13): qty 1

## 2018-10-22 MED ORDER — MENTHOL 3 MG MT LOZG
1.0000 | LOZENGE | OROMUCOSAL | Status: DC | PRN
  Filled 2018-10-22: qty 9

## 2018-10-22 NOTE — Progress Notes (Signed)
PROGRESS NOTE   Blake Bradley  MOQ:[id number]    DOB: [birth year]    DOA: 10/18/2018  PCP: Patient, No Pcp Per   I have briefly reviewed patients previous medical records in [facility].  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  Suspect acute viral illness.  Due to ongoing headaches, concern for aseptic meningitis, ID consulted on 1/8.  Patient had an episode of coffee-ground emesis overnight 1/8.  [facility] consulted.  Now confirmed to have acute cytomegalovirus mononucleosis with associated fevers, sore throat, headaches and hepatitis.  Ongoing supportive care.   Assessment & Plan:   Principal Problem:   CMV infection, acute (HCC) Active Problems:   Headache   Acute hepatitis   Acute CMV mononucleosis with hepatitis and pharyngitis Extensive infectious work-up initially not revealing for cause.   Blood cultures x2 and RSV panel negative. Chest x-ray, CT head and right upper quadrant ultrasound without acute findings. ID input appreciated, low index of suspicion for meningitis and hence no LP at this time. CMV PCR weakly positive.  CMV IgM elevated and PCR positive EBV PCR, HIV viral load,and EBV serologies pending. LFTs slightly more worse today than yesterday.  ID was consulted and assisted with evaluation and management. As discussed with Dr. Orvan Falconer at patient's bedside, patient has acute CMV mononucleosis which would explain his recent fevers, sore throat, headache and hepatitis.  Patient was advised that this will be treated supportively and may take several days or even weeks to completely  resolve. After discussion with pharmacy and careful consideration, started oxycodone 5 mg 3 times daily along with IV fentanyl that he is already been getting for headache and pains.  [facility] was consulted for an episode of coffee-ground emesis and hepatitis. Hepatis work up has been negative thus far (negative Hep A/BC, HIV; Normal Lipase, ASA, APAP) As discussed with Dr. Myrtie Neither, his LFTs may continue to rise but hopefully should plateau and start improving.  Follow LFTs daily and check INR in a.m.  Thrombocytopenia Better today.  Continue to follow CBCs.  Hypokalemia Replaced.  Acute kidney injury Resolved after IV fluids.  Avoid nephrotoxic's.  Allergic reaction Noted by EDP.  After receiving Reglan, Benadryl, Toradol and Zofran he developed erythroderma and pruritus.  Resolved.  Toradol was added to allergy list.  Suspected coffee-ground emesis Patient had one episode of coffee-ground emesis couple of nights ago.  [facility] was consulted.  Suspected due to NSAID induced gastritis.  Seems to have resolved.  Continue PPI.  No work-up planned.  DVT prophylaxis: SCD Code Status: Full Family Communication: None at bedside Disposition: DC home pending clinical improvement   Consultants:  ID  GI.  Procedures:  None  Antimicrobials:  None   Subjective: Patient developed new onset of sore throat since yesterday afternoon.  Ongoing severe headaches.  No photophobia.  No further nausea or vomiting.  ROS: As above, otherwise negative.  Objective:  Vitals:   10/21/18 2132 10/22/18 0427 10/22/18 0924 10/22/18 1721  BP: 120/63 126/69 125/69 132/67  Pulse: (!) 57 92 61 93  Resp: 20 20 18 18   Temp: 99.6 F (37.6 C) 99 F (37.2 C) 98.8 F (37.1 C) 99.1 F (37.3  C)  TempSrc: Oral Oral Oral Oral  SpO2: 100% 99% 100% 100%  Weight: 81.3 kg     Height:        Examination:  General exam: Pleasant young male, well built and nourished lying uncomfortably in bed  this morning due to headache. Respiratory system: Clear to auscultation. Respiratory effort normal.  Stable. Cardiovascular system: S1 & S2 heard, RRR. No JVD, murmurs, rubs, gallops or clicks. No pedal edema.  Stable. Gastrointestinal system: Abdomen is nondistended, soft and nontender. No organomegaly or masses felt. Normal bowel sounds heard. Central nervous system: Alert and oriented. No focal neurological deficits.  Neck remains supple without stiffness. Extremities: Symmetric 5 x 5 power. Skin: No rashes, lesions or ulcers Psychiatry: Judgement and insight appear normal. Mood & affect appropriate. ENT: Posterior pharynx with mild inflammation but no exudates.    Data Reviewed: I have personally reviewed following labs and imaging studies  CBC: Recent Labs  Lab 10/18/18 2110 10/19/18 0514 10/20/18 0646 10/21/18 0737 10/22/18 1018  WBC 6.8 7.5 7.2 6.2 8.6  NEUTROABS  --  5.4  --   --  3.0  HGB 15.3 13.7 13.1 14.0 14.6  HCT 44.8 41.0 38.0* 42.2 42.7  MCV 89.4 87.6 88.4 87.9 88.6  PLT 152 135* 141* 109* 147*   Basic Metabolic Panel: Recent Labs  Lab 10/18/18 2110 10/19/18 0514 10/20/18 0646 10/21/18 0737  NA 138 138 136 136  K 4.2 3.2* 3.4* 3.8  CL 107 106 107 106  CO2 26 21* 22 22  GLUCOSE 113* 144* 125* 84  BUN 13 13 7 9   CREATININE 1.13 1.34* 0.98 0.93  CALCIUM 8.8* 8.2* 7.8* 8.3*   Liver Function Tests: Recent Labs  Lab 10/19/18 0310 10/19/18 1500 10/20/18 0646 10/21/18 0737 10/22/18 0711  AST 118* 144* 192* 333* 366*  ALT 158* 156* 206* 325* 466*  ALKPHOS 191* 186* 196* 230* 284*  BILITOT 2.8* 2.8* 2.8* 3.2* 3.6*  PROT 6.2* 5.2* 5.4* 5.4* 5.8*  ALBUMIN 3.1* 2.8* 2.8* 2.8* 2.9*   Coagulation Profile: Recent Labs  Lab 10/19/18 0310 10/20/18 0646  INR 1.20 1.16   Cardiac Enzymes: Recent Labs  Lab 10/19/18 0514  CKTOTAL 168     Recent Results (from the past 240 hour(s))  Culture, blood (routine x 2)     Status: None (Preliminary result)    Collection Time: 10/19/18  3:10 AM  Result Value Ref Range Status   Specimen Description BLOOD LEFT FOREARM  Final   Special Requests   Final    BOTTLES DRAWN AEROBIC AND ANAEROBIC Blood Culture results may not be optimal due to an inadequate volume of blood received in culture bottles   Culture   Final    NO GROWTH 3 DAYS Performed at [facility], [address] [place], [place] [address]    Report Status PENDING  Incomplete  Culture, blood (routine x 2)     Status: None (Preliminary result)   Collection Time: 10/19/18  3:15 AM  Result Value Ref Range Status   Specimen Description BLOOD LEFT FOREARM  Final   Special Requests   Final    BOTTLES DRAWN AEROBIC AND ANAEROBIC Blood Culture results may not be optimal due to an inadequate volume of blood received in culture bottles   Culture   Final    NO GROWTH 3 DAYS Performed at [facility], [address] [place], [place] [address]    Report Status PENDING  Incomplete  Respiratory Panel by  PCR     Status: None   Collection Time: 10/19/18  4:44 AM  Result Value Ref Range Status   Adenovirus NOT DETECTED NOT DETECTED Final   Coronavirus 229E NOT DETECTED NOT DETECTED Final   Coronavirus HKU1 NOT DETECTED NOT DETECTED Final   Coronavirus NL63 NOT DETECTED NOT DETECTED Final   Coronavirus OC43 NOT DETECTED NOT DETECTED Final   Metapneumovirus NOT DETECTED NOT DETECTED Final   Rhinovirus / Enterovirus NOT DETECTED NOT DETECTED Final   Influenza A NOT DETECTED NOT DETECTED Final   Influenza B NOT DETECTED NOT DETECTED Final   Parainfluenza Virus 1 NOT DETECTED NOT DETECTED Final   Parainfluenza Virus 2 NOT DETECTED NOT DETECTED Final   Parainfluenza Virus 3 NOT DETECTED NOT DETECTED Final   Parainfluenza Virus 4 NOT DETECTED NOT DETECTED Final   Respiratory Syncytial Virus NOT DETECTED NOT DETECTED Final   Bordetella pertussis NOT DETECTED NOT DETECTED Final   Chlamydophila pneumoniae NOT DETECTED NOT DETECTED Final    Mycoplasma pneumoniae NOT DETECTED NOT DETECTED Final    Comment: Performed at [facility], [address] [place], [place] [address]         Radiology Studies: No results found.      Scheduled Meds: . oxyCODONE  5 mg Oral TID  . pantoprazole  40 mg Oral BID   Continuous Infusions: . sodium chloride 1,000 mL (10/20/18 1021)     LOS: 4 days     Marcellus Scott, MD, FACP, [facility]. [facility] Pager [phone]  If 7PM-7AM, please contact night-coverage [email or web address] Password [facility] 10/22/2018, 5:54 PM

## 2018-10-22 NOTE — Progress Notes (Signed)
          Daily Rounding Note  10/22/2018, 9:56 AM  LOS: 4 days   SUBJECTIVE:   Chief complaint: acute hepatitis     Feels awful.  Ongoing headaches, malaise,   OBJECTIVE:         Vital signs in last 24 hours:    Temp:  [98.8 F (37.1 C)-99.6 F (37.6 C)] 98.8 F (37.1 C) (01/10 0924) Pulse Rate:  [57-92] 61 (01/10 0924) Resp:  [18-20] 18 (01/10 0924) BP: (120-136)/(63-79) 125/69 (01/10 0924) SpO2:  [99 %-100 %] 100 % (01/10 0924) Weight:  [81.3 kg] 81.3 kg (01/09 2132) Last BM Date: 10/21/18 Filed Weights   10/19/18 1520 10/19/18 2115 10/21/18 2132  Weight: 83.3 kg 83.1 kg 81.3 kg   General: looks ill  Not toxic.  No jaundice or icterus  Heart: RRR Chest: clear bil.  No Dyspnea Abdomen: soft, NT, no organomegaly.    Extremities: no CCE Neuro/Psych:  oriented x 3.  No tremor or weakness.    Intake/Output from previous day: 01/09 0701 - 01/10 0700 In: 340 [P.O.:340] Out: 0   Intake/Output this shift: No intake/output data recorded.  Lab Results: Recent Labs    10/20/18 0646 10/21/18 0737  WBC 7.2 6.2  HGB 13.1 14.0  HCT 38.0* 42.2  PLT 141* 109*   BMET Recent Labs    10/20/18 0646 10/21/18 0737  NA 136 136  K 3.4* 3.8  CL 107 106  CO2 22 22  GLUCOSE 125* 84  BUN 7 9  CREATININE 0.98 0.93  CALCIUM 7.8* 8.3*   LFT Recent Labs    10/19/18 1500 10/20/18 0646 10/21/18 0737 10/22/18 0711  PROT 5.2* 5.4* 5.4* 5.8*  ALBUMIN 2.8* 2.8* 2.8* 2.9*  AST 144* 192* 333* 366*  ALT 156* 206* 325* 466*  ALKPHOS 186* 196* 230* 284*  BILITOT 2.8* 2.8* 3.2* 3.6*  BILIDIR 1.3*  --   --  2.1*  IBILI 1.5*  --   --  1.5*   PT/INR Recent Labs    10/20/18 0646  LABPROT 14.7  INR 1.16   Scheduled Meds: . pantoprazole  40 mg Oral BID  . potassium chloride  40 mEq Oral Once  . senna-docusate  1 tablet Oral Once   Continuous Infusions: . sodium chloride 1,000 mL (10/20/18 1021)   PRN Meds:.sodium  chloride, diphenhydrAMINE, fentaNYL (SUBLIMAZE) injection, ondansetron **OR** ondansetron (ZOFRAN) IV, phenol, senna-docusate **FOLLOWED BY** senna   ASSESMENT:   *   Acute CMV hepatitis. Strongly positive CMV IgM of > 240 (rr 0 - 29).  LFTs rising, normal PT/INR reassuring.  *    CGE, strongly suspect ibuprofen induced gastritis.  On OProtonix 40 PO BID.   *  IBS quiescent.      PLAN   *  Follow CMET, coags in AM.  Supportive care.  ID following.      Blake Bradley  10/22/2018, 9:56 AM Phone [phone]

## 2018-10-22 NOTE — Progress Notes (Signed)
[facility]  Date of Admission:  10/18/2018                         Total days of antibiotics 0                                                                                                                                                  Assessment: Blake Bradley is a 28 year old male with a history of post concussive syndrome, anxiety and appendicitis (s/p appendectomy in 2015). Who presented with two weeks of body aches, worsening headache, and subjective fevers and chills. LFTs elevated and uptrending since admission. He has been seen by GI for bloody emesis, likely NSAID induced, and started on PPI. CMV IgM strongly positive and now has now developed pharyngitis. EBV serology and HIV Viral load pending.  Plan:   1. Acute CMV Mononucleosis with Hepatitis - Monitor LFTs, INR - Supportive Care   Principal Problem:   CMV infection, acute (HCC) Active Problems:   Headache   Acute hepatitis   Scheduled Meds: . pantoprazole  40 mg Oral BID  . potassium chloride  40 mEq Oral Once  . senna-docusate  1 tablet Oral Once   Continuous Infusions: . sodium chloride 1,000 mL (10/20/18 1021)   PRN Meds:.sodium chloride, diphenhydrAMINE, fentaNYL (SUBLIMAZE) injection, ondansetron **OR** ondansetron (ZOFRAN) IV, phenol, senna-docusate **FOLLOWED BY** senna   SUBJECTIVE: Blake Bradley has had intermittently controlled headache/pain, better controlled when taking medication more consistently. He has developed a sore throat for which he received chloraseptic spray overnight. Throat remain sore this morning. He has not eaten as he was initially scheduled for EGD, which has been cancelled. No other complaints.   Review of Systems: Review of Systems  Constitutional: Negative for chills and fever.  HENT: Positive for sore throat.   Respiratory: Negative for shortness of breath.   Cardiovascular: Negative for chest pain.  Gastrointestinal: Negative for nausea  and vomiting.  Neurological: Positive for headaches.    Allergies  Allergen Reactions  . Codeine Anaphylaxis  . Ketorolac Rash    Possible rash, itching. Occurred with other meds. Tolerates ibuprofen  . Morphine And Related Hives  . Prednisone Hives    OBJECTIVE: Vitals:   10/21/18 1707 10/21/18 2132 10/22/18 0427 10/22/18 0924  BP: 136/79 120/63 126/69 125/69  Pulse: 71 (!) 57 92 61  Resp: 18 20 20 18   Temp: 99.1 F (37.3 C) 99.6 F (37.6 C) 99 F (37.2 C) 98.8 F (37.1 C)  TempSrc: Oral Oral Oral Oral  SpO2: 100% 100% 99% 100%  Weight:  81.3 kg    Height:       Body mass index is 23.01 kg/m.  Physical Exam Constitutional:      General: He is not  in acute distress.    Appearance: He is well-developed.  HENT:     Mouth/Throat:     Pharynx: Pharyngeal swelling and posterior oropharyngeal erythema present.     Tonsils: Tonsillar exudate present.  Eyes:     General: No scleral icterus.    Extraocular Movements: Extraocular movements intact.  Neck:     Musculoskeletal: Normal range of motion. No neck rigidity.  Cardiovascular:     Rate and Rhythm: Normal rate and regular rhythm.     Pulses: Normal pulses.     Heart sounds: Normal heart sounds.  Pulmonary:     Effort: Pulmonary effort is normal. No respiratory distress.     Breath sounds: Normal breath sounds.  Abdominal:     General: Bowel sounds are normal. There is no distension.     Palpations: Abdomen is soft.     Tenderness: There is no abdominal tenderness.  Musculoskeletal:        General: No swelling or deformity.  Skin:    General: Skin is warm and dry.  Neurological:     General: No focal deficit present.     Mental Status: He is alert. Mental status is at baseline.     Lab Results Lab Results  Component Value Date   WBC 6.2 10/21/2018   HGB 14.0 10/21/2018   HCT 42.2 10/21/2018   MCV 87.9 10/21/2018   PLT 109 (L) 10/21/2018    Lab Results  Component Value Date   CREATININE 0.93  10/21/2018   BUN 9 10/21/2018   NA 136 10/21/2018   K 3.8 10/21/2018   CL 106 10/21/2018   CO2 22 10/21/2018    Lab Results  Component Value Date   ALT 466 (H) 10/22/2018   AST 366 (H) 10/22/2018   ALKPHOS 284 (H) 10/22/2018   BILITOT 3.6 (H) 10/22/2018     Microbiology: Recent Results (from the past 240 hour(s))  Culture, blood (routine x 2)     Status: None (Preliminary result)   Collection Time: 10/19/18  3:10 AM  Result Value Ref Range Status   Specimen Description BLOOD LEFT FOREARM  Final   Special Requests   Final    BOTTLES DRAWN AEROBIC AND ANAEROBIC Blood Culture results may not be optimal due to an inadequate volume of blood received in culture bottles   Culture   Final    NO GROWTH 3 DAYS Performed at [facility], [address] [place], [place] [address]    Report Status PENDING  Incomplete  Culture, blood (routine x 2)     Status: None (Preliminary result)   Collection Time: 10/19/18  3:15 AM  Result Value Ref Range Status   Specimen Description BLOOD LEFT FOREARM  Final   Special Requests   Final    BOTTLES DRAWN AEROBIC AND ANAEROBIC Blood Culture results may not be optimal due to an inadequate volume of blood received in culture bottles   Culture   Final    NO GROWTH 3 DAYS Performed at [facility], [address] [place], [place] [address]    Report Status PENDING  Incomplete  Respiratory Panel by PCR     Status: None   Collection Time: 10/19/18  4:44 AM  Result Value Ref Range Status   Adenovirus NOT DETECTED NOT DETECTED Final   Coronavirus 229E NOT DETECTED NOT DETECTED Final   Coronavirus HKU1 NOT DETECTED NOT DETECTED Final   Coronavirus NL63 NOT DETECTED NOT DETECTED Final   Coronavirus OC43  NOT DETECTED NOT DETECTED Final   Metapneumovirus NOT DETECTED NOT DETECTED Final   Rhinovirus / Enterovirus NOT DETECTED NOT DETECTED Final   Influenza A NOT DETECTED NOT DETECTED Final   Influenza B NOT DETECTED NOT DETECTED Final    Parainfluenza Virus 1 NOT DETECTED NOT DETECTED Final   Parainfluenza Virus 2 NOT DETECTED NOT DETECTED Final   Parainfluenza Virus 3 NOT DETECTED NOT DETECTED Final   Parainfluenza Virus 4 NOT DETECTED NOT DETECTED Final   Respiratory Syncytial Virus NOT DETECTED NOT DETECTED Final   Bordetella pertussis NOT DETECTED NOT DETECTED Final   Chlamydophila pneumoniae NOT DETECTED NOT DETECTED Final   Mycoplasma pneumoniae NOT DETECTED NOT DETECTED Final    Comment: Performed at [facility], [address], [place], [place] [address]    Beola Cord, MD IM PGY-2 [phone] 10/22/2018, 9:44 AM

## 2018-10-22 NOTE — Care Management Note (Signed)
Case Management Note Hortencia Conradi, RN MSN CCM Transitions of Care 13[facility] [place] [phone]  Patient Details  Name: Blake Bradley MRN: [id number] Date of Birth: 01/07/1991  Subjective/Objective:          Acute hepatitis          Action/Plan: PTA home with family. Independent. No HH/DME needs. No transportation needs. No insurance. No PCP. Scheduled appointment at [facility] for 11/17/2018 8:50 am. Patient expressed appreciation. Will continue to follow for transition of care needs.   Expected Discharge Date:                  Expected Discharge Plan:  Home/[place] [facility]  In-House Referral:  Financial Counselor  Discharge planning Services  CM Consult, Follow-up appt scheduled  Post Acute Care Choice:  NA Choice offered to:  NA  DME Arranged:  N/A DME Agency:  NA  HH Arranged:  NA HH Agency:  NA  Status of Service:  In process, will continue to follow  If discussed at Long Length of Stay Meetings, dates discussed:    Additional Comments:  Bess Kinds, RN 10/22/2018, 1:21 PM

## 2018-10-23 LAB — HEPATIC FUNCTION PANEL
ALT: 479 U/L — ABNORMAL HIGH (ref 0–44)
AST: 346 U/L — ABNORMAL HIGH (ref 15–41)
Albumin: 3 g/dL — ABNORMAL LOW (ref 3.5–5.0)
Alkaline Phosphatase: 355 U/L — ABNORMAL HIGH (ref 38–126)
Bilirubin, Direct: 1.9 mg/dL — ABNORMAL HIGH (ref 0.0–0.2)
Indirect Bilirubin: 1.3 mg/dL — ABNORMAL HIGH (ref 0.3–0.9)
Total Bilirubin: 3.2 mg/dL — ABNORMAL HIGH (ref 0.3–1.2)
Total Protein: 6.1 g/dL — ABNORMAL LOW (ref 6.5–8.1)

## 2018-10-23 LAB — CBC
HCT: 45.7 % (ref 39.0–52.0)
Hemoglobin: 15 g/dL (ref 13.0–17.0)
MCH: 29 pg (ref 26.0–34.0)
MCHC: 32.8 g/dL (ref 30.0–36.0)
MCV: 88.4 fL (ref 80.0–100.0)
Platelets: 147 10*3/uL — ABNORMAL LOW (ref 150–400)
RBC: 5.17 MIL/uL (ref 4.22–5.81)
RDW: 13 % (ref 11.5–15.5)
WBC: 9.5 10*3/uL (ref 4.0–10.5)
nRBC: 0 % (ref 0.0–0.2)

## 2018-10-23 LAB — PROTIME-INR
INR: 0.96
Prothrombin Time: 12.7 seconds (ref 11.4–15.2)

## 2018-10-23 LAB — GLUCOSE, CAPILLARY: GLUCOSE-CAPILLARY: 120 mg/dL — AB (ref 70–99)

## 2018-10-23 NOTE — Progress Notes (Signed)
I was asked to assess Blake Bradley d/t tongue and facial numbess on the right side of his face.  Sensation is equal in his arms and legs. Normal strength and weakness of all 4 extremities. Tongue midline, face symmetrical, alert and oriented x4. No ataxia.  No notable weakness or deficit.  Please call again if further assistance needed.

## 2018-10-23 NOTE — Progress Notes (Signed)
PROGRESS NOTE   Blake Bradley  NOI:[id number]    DOB: 1991-07-07    DOA: 10/18/2018  PCP: Patient, No Pcp Per   I have briefly reviewed patients previous medical records in [facility].  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  Suspect acute viral illness.  Due to ongoing headaches, concern for aseptic meningitis, ID consulted on 1/8.  Patient had an episode of coffee-ground emesis overnight 1/8.  [facility] consulted.  Now confirmed to have acute cytomegalovirus mononucleosis with associated fevers, sore throat, headaches and hepatitis.  Ongoing supportive care.   Assessment & Plan:   Principal Problem:   CMV infection, acute (HCC) Active Problems:   Headache   Acute hepatitis   Acute CMV mononucleosis with hepatitis and pharyngitis Extensive infectious work-up initially not revealing for cause.   Blood cultures x2 and RSV panel negative. Chest x-ray, CT head and right upper quadrant ultrasound without acute findings. ID input appreciated, low index of suspicion for meningitis and hence no LP at this time. CMV PCR weakly positive.  CMV IgM elevated (>240) and PCR positive EBV VCA IgG, IgM positive.  Early antigen Ab, IgG: Positive. EBV NA IgG negative. HIV viral load negative.  ID was consulted and assisted with evaluation and management. As discussed with Dr. Orvan Falconer, ID, patient has acute CMV mononucleosis which would explain his recent fevers, sore throat, headache and hepatitis.  Patient was advised that this will be treated supportively and may take several days or even weeks to completely  resolve.  [facility] was consulted for an episode of coffee-ground emesis and hepatitis. Hepatis work up has been negative thus far (negative Hep A/BC, HIV; Normal Lipase, ASA, APAP) As discussed with Dr. Myrtie Neither, his LFTs may continue to rise but hopefully should plateau and start improving.  Follow LFTs daily  Sore throat seems worse today.  Headache slightly better but still getting round-the-clock IV fentanyl along with oxycodone, LFTs seem to be plateauing off.  Continue supportive care and hopefully in the next day or 2 with improvement in symptoms i.e. headache and sore throat, stable/improving LFTs, symptom management on oral medications, could consider discharge home with close outpatient follow-up  Thrombocytopenia Better today.  Continue to follow CBCs.  Hypokalemia Replaced.  Acute kidney injury Resolved after IV fluids.  Avoid nephrotoxic's.  Allergic reaction Noted by EDP.  After receiving Reglan, Benadryl, Toradol and Zofran he developed erythroderma and pruritus.  Resolved.  Toradol was added to allergy list.  Suspected coffee-ground emesis Patient had one episode of coffee-ground emesis couple of nights ago.  [facility] was consulted.  Suspected due to NSAID induced gastritis.  Seems to have resolved.  Continue PPI.  No work-up planned.  DVT prophylaxis: SCD Code Status: Full Family Communication: None at bedside Disposition: DC home pending clinical improvement   Consultants:  [facility].  Procedures:  None  Antimicrobials:  None   Subjective: Sore throat worse today.  Headache is better controlled.  Still using round-the-clock IV fentanyl and scheduled oxycodone.  No vomiting.  ROS: As above, otherwise negative.  Objective:  Vitals:   10/22/18 1721 10/22/18 2240 10/23/18 0100 10/23/18 0511  BP: 132/67 119/65  106/63  Pulse: 93 91  78  Resp: 18 18  20   Temp: 99.1 F (37.3 C) 99.9 F (37.7 C)  98.3 F (36.8 C)  TempSrc: Oral Oral  Oral   SpO2: 100% 100%  99%  Weight:   81.6 kg   Height:        Examination:  General exam: Pleasant young male, well built and nourished lying comfortably in bed.  Does not appear to be in any distress. Respiratory system: Clear to auscultation. Respiratory effort normal.  Stable. Cardiovascular system: S1 & S2 heard, RRR. No JVD, murmurs, rubs, gallops or clicks. No pedal edema.  Stable. Gastrointestinal system: Abdomen is nondistended, soft and nontender. No organomegaly or masses felt. Normal bowel sounds heard. Central nervous system: Alert and oriented. No focal neurological deficits.  Neck remains supple without stiffness. Extremities: Symmetric 5 x 5 power. Skin: No rashes, lesions or ulcers Psychiatry: Judgement and insight appear normal. Mood & affect appropriate. ENT: Posterior pharynx looks slightly more congested than yesterday but still no exudates.  No stridor.    Data Reviewed: I have personally reviewed following labs and imaging studies  CBC: Recent Labs  Lab 10/19/18 0514 10/20/18 0646 10/21/18 0737 10/22/18 1018 10/23/18 0548  WBC 7.5 7.2 6.2 8.6 9.5  NEUTROABS 5.4  --   --  3.0  --   HGB 13.7 13.1 14.0 14.6 15.0  HCT 41.0 38.0* 42.2 42.7 45.7  MCV 87.6 88.4 87.9 88.6 88.4  PLT 135* 141* 109* 147* 147*   Basic Metabolic Panel: Recent Labs  Lab 10/18/18 2110 10/19/18 0514 10/20/18 0646 10/21/18 0737  NA 138 138 136 136  K 4.2 3.2* 3.4* 3.8  CL 107 106 107 106  CO2 26 21* 22 22  GLUCOSE 113* 144* 125* 84  BUN 13 13 7 9   CREATININE 1.13 1.34* 0.98 0.93  CALCIUM 8.8* 8.2* 7.8* 8.3*   Liver Function Tests: Recent Labs  Lab 10/19/18 1500 10/20/18 0646 10/21/18 0737 10/22/18 0711 10/23/18 0548  AST 144* 192* 333* 366* 346*  ALT 156* 206* 325* 466* 479*  ALKPHOS 186* 196* 230* 284* 355*  BILITOT 2.8* 2.8* 3.2* 3.6* 3.2*  PROT 5.2* 5.4* 5.4* 5.8* 6.1*  ALBUMIN 2.8* 2.8* 2.8* 2.9* 3.0*   Coagulation Profile: Recent Labs  Lab 10/19/18 0310  10/20/18 0646 10/23/18 0548  INR 1.20 1.16 0.96   Cardiac Enzymes: Recent Labs  Lab 10/19/18 0514  CKTOTAL 168     Recent Results (from the past 240 hour(s))  Culture, blood (routine x 2)     Status: None (Preliminary result)   Collection Time: 10/19/18  3:10 AM  Result Value Ref Range Status   Specimen Description BLOOD LEFT FOREARM  Final   Special Requests   Final    BOTTLES DRAWN AEROBIC AND ANAEROBIC Blood Culture results may not be optimal due to an inadequate volume of blood received in culture bottles   Culture   Final    NO GROWTH 4 DAYS Performed at [facility], [address] [place], [place] [address]    Report Status PENDING  Incomplete  Culture, blood (routine x 2)     Status: None (Preliminary result)   Collection Time: 10/19/18  3:15 AM  Result Value Ref Range Status   Specimen Description BLOOD LEFT FOREARM  Final   Special Requests   Final    BOTTLES DRAWN AEROBIC AND ANAEROBIC Blood Culture results may not be optimal due to an inadequate volume of blood received in culture  bottles   Culture   Final    NO GROWTH 4 DAYS Performed at [facility], [address] [place], [place] [address]    Report Status PENDING  Incomplete  Respiratory Panel by PCR     Status: None   Collection Time: 10/19/18  4:44 AM  Result Value Ref Range Status   Adenovirus NOT DETECTED NOT DETECTED Final   Coronavirus 229E NOT DETECTED NOT DETECTED Final   Coronavirus HKU1 NOT DETECTED NOT DETECTED Final   Coronavirus NL63 NOT DETECTED NOT DETECTED Final   Coronavirus OC43 NOT DETECTED NOT DETECTED Final   Metapneumovirus NOT DETECTED NOT DETECTED Final   Rhinovirus / Enterovirus NOT DETECTED NOT DETECTED Final   Influenza A NOT DETECTED NOT DETECTED Final   Influenza B NOT DETECTED NOT DETECTED Final   Parainfluenza Virus 1 NOT DETECTED NOT DETECTED Final   Parainfluenza Virus 2 NOT DETECTED NOT DETECTED Final   Parainfluenza Virus 3 NOT DETECTED NOT DETECTED  Final   Parainfluenza Virus 4 NOT DETECTED NOT DETECTED Final   Respiratory Syncytial Virus NOT DETECTED NOT DETECTED Final   Bordetella pertussis NOT DETECTED NOT DETECTED Final   Chlamydophila pneumoniae NOT DETECTED NOT DETECTED Final   Mycoplasma pneumoniae NOT DETECTED NOT DETECTED Final    Comment: Performed at [facility], [address] [place], [place] [address]         Radiology Studies: No results found.      Scheduled Meds: . oxyCODONE  5 mg Oral TID  . pantoprazole  40 mg Oral BID   Continuous Infusions: . sodium chloride 1,000 mL (10/20/18 1021)     LOS: 5 days     Marcellus ScottAnand Aryn Kops, MD, FACP, [facility]. [facility] Pager [phone]  If 7PM-7AM, please contact night-coverage [email or web address] Password [email or web address] 10/23/2018, 3:16 PM

## 2018-10-24 LAB — HEPATIC FUNCTION PANEL
ALT: 488 U/L — AB (ref 0–44)
AST: 311 U/L — ABNORMAL HIGH (ref 15–41)
Albumin: 2.9 g/dL — ABNORMAL LOW (ref 3.5–5.0)
Alkaline Phosphatase: 390 U/L — ABNORMAL HIGH (ref 38–126)
BILIRUBIN INDIRECT: 1.3 mg/dL — AB (ref 0.3–0.9)
Bilirubin, Direct: 2 mg/dL — ABNORMAL HIGH (ref 0.0–0.2)
Total Bilirubin: 3.3 mg/dL — ABNORMAL HIGH (ref 0.3–1.2)
Total Protein: 6.1 g/dL — ABNORMAL LOW (ref 6.5–8.1)

## 2018-10-24 LAB — CULTURE, BLOOD (ROUTINE X 2)
CULTURE: NO GROWTH
Culture: NO GROWTH

## 2018-10-24 LAB — EPSTEIN BARR VRS(EBV DNA BY PCR)
EBV DNA QN by PCR: 3612 copies/mL
log10 EBV DNA Qn PCR: 3.558 log10 copy/mL

## 2018-10-24 MED ORDER — MAGIC MOUTHWASH W/LIDOCAINE
10.0000 mL | Freq: Four times a day (QID) | ORAL | Status: DC | PRN
  Administered 2018-10-24 – 2018-10-25 (×2): 10 mL via ORAL
  Filled 2018-10-24 (×4): qty 10

## 2018-10-24 MED ORDER — FENTANYL CITRATE (PF) 100 MCG/2ML IJ SOLN
50.0000 ug | Freq: Four times a day (QID) | INTRAMUSCULAR | Status: DC | PRN
  Administered 2018-10-24 – 2018-10-26 (×8): 50 ug via INTRAVENOUS
  Filled 2018-10-24 (×8): qty 2

## 2018-10-24 MED ORDER — DIPHENHYDRAMINE HCL 25 MG PO CAPS
25.0000 mg | ORAL_CAPSULE | Freq: Four times a day (QID) | ORAL | Status: DC | PRN
  Administered 2018-10-24 – 2018-10-25 (×5): 25 mg via ORAL
  Filled 2018-10-24 (×6): qty 1

## 2018-10-24 NOTE — Progress Notes (Signed)
   10/24/18 0239  Provider Notification  Provider Name/Title Craige Cotta NP  Date Provider Notified 10/24/18  Time Provider Notified [phone]  Notification Type Page  Notification Reason Change in status (c/o severe itching and rash)   Patient continues to complain of mouth and jaw numbness.  His speech is clear and no difficulty swallowing.    At approximately 0230, patient called RN to room.  He is complaining of severe itching and rash.  Upon assessment to the skin there is redness to portions of back, bilateral arms, and bilateral legs.  Obvious marks where patient had been scratching.  Benadry IV 25 mg given at 0253.  He feels that the oxycodone is causing the itching.  [facility] made aware but no additional orders received.  Patient experienced some relief of the itching.  During the night, the patient continues to call for IV Fentanyl frequently.  Sometimes he is calling up to 30 minutes early.  He continues to complain of a severe headache.  No obvious signs of distress.  VS remain stable.  Will continue to monitor patient.  Bernie Covey RN

## 2018-10-24 NOTE — Progress Notes (Addendum)
PROGRESS NOTE   Blake Bradley  ONG:[id number]    DOB: 04/02/1991    DOA: 10/18/2018  PCP: Patient, No Pcp Per   I have briefly reviewed patients [facility].  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  Suspect acute viral illness.  Due to ongoing headaches, concern for aseptic meningitis, ID consulted on 1/8.  Patient had an episode of coffee-ground emesis overnight 1/8.  [facility] consulted.  Now confirmed to have acute cytomegalovirus mononucleosis with associated fevers, sore throat, headaches and hepatitis.  Ongoing supportive care.   Assessment & Plan:   Principal Problem:   CMV infection, acute (HCC) Active Problems:   Headache   Acute hepatitis   Acute CMV mononucleosis with hepatitis and pharyngitis Extensive infectious work-up initially not revealing for cause.   Blood cultures x2 and RSV panel negative. Chest x-ray, CT head and right upper quadrant ultrasound without acute findings. ID input appreciated, low index of suspicion for meningitis and hence no LP at this time. CMV PCR weakly positive.  CMV IgM elevated (>240) and PCR positive EBV VCA IgG, IgM positive.  Early antigen Ab, IgG: Positive. EBV NA IgG negative. HIV viral load negative.  ID was consulted and assisted with evaluation and management. As discussed with Dr. Orvan Falconerampbell, ID, patient has acute CMV mononucleosis which would explain his recent fevers, sore throat, headache and hepatitis.  Patient was advised that this will be treated supportively and may take several days or even weeks to completely  resolve.  [facility] was consulted for an episode of coffee-ground emesis and hepatitis. Hepatis work up has been negative thus far (negative Hep A/BC, HIV; Normal Lipase, ASA, APAP) As discussed with Dr. Myrtie Neitheranis, his LFTs may continue to rise but hopefully should plateau and start improving.  Follow LFTs daily  I discussed with Dr. Orvan Falconerampbell regarding abnormal EBV serology results on 1/11.  He indicated that it is less likely for patient to have both CMV and EBV and suspects patient primarily has CMV infection which has triggered secondary changes in EBV serology.  Persisting sore throat/throat pain with some intermittent left earache but has not complained of headache in the last 2 days.  Magic mouthwash with lidocaine added.  Continue Cepacol lozenges.  As discussed with patient, weaning off of IV fentanyl.  Reported mild hives with oxycodone but tolerated after Benadryl, continue.  LFTs seem to be plateauing off.  INR normal.  Continue supportive care and hopefully in the next day or 2 with improvement in symptoms i.e. sore throat, stable/improving LFTs, symptom management on oral medications, could consider discharge home with close outpatient follow-up  Thrombocytopenia Better today.  Continue to follow CBCs.  Hypokalemia Replaced.  Acute kidney injury Resolved after IV fluids.  Avoid nephrotoxic's.  Allergic reaction Noted by EDP.  After receiving Reglan, Benadryl, Toradol and Zofran he developed erythroderma and pruritus.  Resolved.  Toradol was added to allergy list.  Suspected coffee-ground emesis Patient had one episode of coffee-ground emesis couple of nights ago.  [facility] was consulted.  Suspected due to NSAID induced gastritis.  Seems to have resolved.  Continue PPI.  No work-up planned.  DVT prophylaxis: SCD Code  Status: Full Family Communication: After patient consented, I discussed in detail with patient's father via phone, updated care and answered all  questions. Disposition: DC home pending clinical improvement, hopefully in the next 24-48 hours   Consultants:  ID [facility].  Procedures:  None  Antimicrobials:  None   Subjective: Overnight events noted, reported tongue and right facial numbness, evaluated by rapid response and no deficits noted.  No recurrence of symptoms. Interviewed and examined with RN.  Ongoing throat pain, intermittent left earache.  Not able to eat much due to throat pain.  No vomiting reported.  Has not complained of headache in the last 2 days to me.  ROS: As above, otherwise negative.  Objective:  Vitals:   10/23/18 1637 10/23/18 2039 10/24/18 0240 10/24/18 0632  BP: 127/70 130/74 140/76 135/65  Pulse: 78 92 74 89  Resp: 18 18  18   Temp: 99.6 F (37.6 C) 99.1 F (37.3 C)  98.5 F (36.9 C)  TempSrc: Oral Oral  Oral  SpO2: 100% 100% 100% 100%  Weight:  81.7 kg    Height:        Examination:  General exam: Pleasant young male, well built and nourished lying in bed.  Appears comfortable and in no distress. Respiratory system: Clear to auscultation.  No increased work of breathing. Cardiovascular system: S1 & S2 heard, RRR. No JVD, murmurs, rubs, gallops or clicks. No pedal edema.  Stable. Gastrointestinal system: Abdomen is nondistended, soft and nontender. No organomegaly or masses felt. Normal bowel sounds heard.  Stable. Central nervous system: Alert and oriented. No focal neurological deficits.  Neck remains supple without stiffness. Extremities: Symmetric 5 x 5 power. Skin: No rashes, lesions or ulcers Psychiatry: Judgement and insight appear normal. Mood & affect mostly flat. ENT: Posterior pharyngeal wall looks erythematous without exudates.  No stridor.    Data Reviewed: I have personally reviewed following labs and imaging studies  CBC: Recent Labs  Lab 10/19/18 0514 10/20/18 0646 10/21/18 0737 10/22/18 1018 10/23/18 0548  WBC 7.5 7.2 6.2 8.6 9.5  NEUTROABS 5.4  --   --   3.0  --   HGB 13.7 13.1 14.0 14.6 15.0  HCT 41.0 38.0* 42.2 42.7 45.7  MCV 87.6 88.4 87.9 88.6 88.4  PLT 135* 141* 109* 147* 147*   Basic Metabolic Panel: Recent Labs  Lab 10/18/18 2110 10/19/18 0514 10/20/18 0646 10/21/18 0737  NA 138 138 136 136  K 4.2 3.2* 3.4* 3.8  CL 107 106 107 106  CO2 26 21* 22 22  GLUCOSE 113* 144* 125* 84  BUN 13 13 7 9   CREATININE 1.13 1.34* 0.98 0.93  CALCIUM 8.8* 8.2* 7.8* 8.3*   Liver Function Tests: Recent Labs  Lab 10/20/18 0646 10/21/18 0737 10/22/18 0711 10/23/18 0548 10/24/18 0543  AST 192* 333* 366* 346* 311*  ALT 206* 325* 466* 479* 488*  ALKPHOS 196* 230* 284* 355* 390*  BILITOT 2.8* 3.2* 3.6* 3.2* 3.3*  PROT 5.4* 5.4* 5.8* 6.1* 6.1*  ALBUMIN 2.8* 2.8* 2.9* 3.0* 2.9*   Coagulation Profile: Recent Labs  Lab 10/19/18 0310 10/20/18 0646 10/23/18 0548  INR 1.20 1.16 0.96   Cardiac Enzymes: Recent Labs  Lab 10/19/18 0514  CKTOTAL 168     Recent Results (from the past 240 hour(s))  Culture, blood (routine x 2)     Status: None   Collection Time: 10/19/18  3:10 AM  Result Value Ref Range Status   Specimen Description BLOOD LEFT FOREARM  Final  Special Requests   Final    BOTTLES DRAWN AEROBIC AND ANAEROBIC Blood Culture results may not be optimal due to an inadequate volume of blood received in culture bottles   Culture   Final    NO GROWTH 5 DAYS Performed at [facility], [address] [place], [place] [address]    Report Status 10/24/2018 FINAL  Final  Culture, blood (routine x 2)     Status: None   Collection Time: 10/19/18  3:15 AM  Result Value Ref Range Status   Specimen Description BLOOD LEFT FOREARM  Final   Special Requests   Final    BOTTLES DRAWN AEROBIC AND ANAEROBIC Blood Culture results may not be optimal due to an inadequate volume of blood received in culture bottles   Culture   Final    NO GROWTH 5 DAYS Performed at [facility], [address] [place], [place] [address]     Report Status 10/24/2018 FINAL  Final  Respiratory Panel by PCR     Status: None   Collection Time: 10/19/18  4:44 AM  Result Value Ref Range Status   Adenovirus NOT DETECTED NOT DETECTED Final   Coronavirus 229E NOT DETECTED NOT DETECTED Final   Coronavirus HKU1 NOT DETECTED NOT DETECTED Final   Coronavirus NL63 NOT DETECTED NOT DETECTED Final   Coronavirus OC43 NOT DETECTED NOT DETECTED Final   Metapneumovirus NOT DETECTED NOT DETECTED Final   Rhinovirus / Enterovirus NOT DETECTED NOT DETECTED Final   Influenza A NOT DETECTED NOT DETECTED Final   Influenza B NOT DETECTED NOT DETECTED Final   Parainfluenza Virus 1 NOT DETECTED NOT DETECTED Final   Parainfluenza Virus 2 NOT DETECTED NOT DETECTED Final   Parainfluenza Virus 3 NOT DETECTED NOT DETECTED Final   Parainfluenza Virus 4 NOT DETECTED NOT DETECTED Final   Respiratory Syncytial Virus NOT DETECTED NOT DETECTED Final   Bordetella pertussis NOT DETECTED NOT DETECTED Final   Chlamydophila pneumoniae NOT DETECTED NOT DETECTED Final   Mycoplasma pneumoniae NOT DETECTED NOT DETECTED Final    Comment: Performed at [facility], [address] [place], [place] [address]         Radiology Studies: No results found.      Scheduled Meds: . oxyCODONE  5 mg Oral TID  . pantoprazole  40 mg Oral BID   Continuous Infusions: . sodium chloride 1,000 mL (10/20/18 1021)     LOS: 6 days     Marcellus Scott, MD, FACP, [facility]. [facility] Pager [phone]  If 7PM-7AM, please contact night-coverage [email or web address] Password [facility] 10/24/2018, 12:53 PM

## 2018-10-25 LAB — CBC
HCT: 45.3 % (ref 39.0–52.0)
Hemoglobin: 14.9 g/dL (ref 13.0–17.0)
MCH: 29.1 pg (ref 26.0–34.0)
MCHC: 32.9 g/dL (ref 30.0–36.0)
MCV: 88.5 fL (ref 80.0–100.0)
Platelets: 151 10*3/uL (ref 150–400)
RBC: 5.12 MIL/uL (ref 4.22–5.81)
RDW: 13.1 % (ref 11.5–15.5)
WBC: 10.8 10*3/uL — AB (ref 4.0–10.5)
nRBC: 0 % (ref 0.0–0.2)

## 2018-10-25 LAB — HEPATIC FUNCTION PANEL
ALT: 427 U/L — ABNORMAL HIGH (ref 0–44)
AST: 244 U/L — ABNORMAL HIGH (ref 15–41)
Albumin: 3 g/dL — ABNORMAL LOW (ref 3.5–5.0)
Alkaline Phosphatase: 439 U/L — ABNORMAL HIGH (ref 38–126)
Bilirubin, Direct: 2.4 mg/dL — ABNORMAL HIGH (ref 0.0–0.2)
Indirect Bilirubin: 1.7 mg/dL — ABNORMAL HIGH (ref 0.3–0.9)
Total Bilirubin: 4.1 mg/dL — ABNORMAL HIGH (ref 0.3–1.2)
Total Protein: 6.4 g/dL — ABNORMAL LOW (ref 6.5–8.1)

## 2018-10-25 LAB — BASIC METABOLIC PANEL
Anion gap: 9 (ref 5–15)
BUN: 10 mg/dL (ref 6–20)
CO2: 26 mmol/L (ref 22–32)
Calcium: 8.2 mg/dL — ABNORMAL LOW (ref 8.9–10.3)
Chloride: 99 mmol/L (ref 98–111)
Creatinine, Ser: 1.04 mg/dL (ref 0.61–1.24)
Glucose, Bld: 128 mg/dL — ABNORMAL HIGH (ref 70–99)
Potassium: 3.8 mmol/L (ref 3.5–5.1)
Sodium: 134 mmol/L — ABNORMAL LOW (ref 135–145)

## 2018-10-25 LAB — PATHOLOGIST SMEAR REVIEW: PATH REVIEW: REACTIVE

## 2018-10-25 NOTE — Progress Notes (Signed)
Patient's father called this RN at approximately [phone].  The patient had called his dad and told him that he wanted to go to another hospital because he felt he was not receiving the care he needed.  His dad stated his pain was not being controlled since the Fentanyl had been increased from Q 3 hours to Q 6 hours.  Also, the patient stated he felt he was feeling worse.  I addressed the patient's concerns with the patient and father separately.  I explained that the patient needed to talk to his caregivers directly with his concerns.  I also explained that the choice to seek care elsewhere was his.  I explained that the mono was viral and we could treat the symptoms.  I explained that the recovery period could take greater than a few days.  I explained that we would address his concerns with Dr. Waymon Amato when he rounded on the floor.  Will continue to monitor patient.  Bernie Covey RN

## 2018-10-25 NOTE — Progress Notes (Signed)
PROGRESS NOTE   Blake RaterBrantley Bradley  BJY:[id number]    DOB: 03/20/1991    DOA: 10/18/2018  PCP: Patient, No Pcp Per   I have briefly reviewed patients [facility].  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  Suspect acute viral illness.  Due to ongoing headaches, concern for aseptic meningitis, ID consulted on 1/8.  Patient had an episode of coffee-ground emesis overnight 1/8.  [facility] consulted.  Now confirmed to have acute cytomegalovirus mononucleosis with associated fevers, sore throat, headaches and hepatitis.  Ongoing supportive care.   Assessment & Plan:   Principal Problem:   CMV infection, acute (HCC) Active Problems:   Headache   Acute hepatitis   Acute CMV mononucleosis with hepatitis and pharyngitis Extensive infectious work-up initially not revealing for cause.   Blood cultures x2 and RSV panel negative. Chest x-ray, CT head and right upper quadrant ultrasound without acute findings. ID input appreciated, low index of suspicion for meningitis and hence no LP at this time. CMV PCR weakly positive.  CMV IgM elevated (>240) and PCR positive EBV VCA IgG, IgM positive.  Early antigen Ab, IgG: Positive. EBV NA IgG negative. HIV viral load negative.  ID was consulted and assisted with evaluation and management. As discussed with Dr. Orvan Falconerampbell, ID, patient has acute CMV mononucleosis which would explain his recent fevers, sore throat, headache and hepatitis.  Patient was advised that this will be treated supportively and may take several days or even weeks to completely  resolve.  [facility] was consulted for an episode of coffee-ground emesis and hepatitis. Hepatis work up has been negative thus far (negative Hep A/BC, HIV; Normal Lipase, ASA, APAP) As discussed with Dr. Myrtie Neitheranis, his LFTs may continue to rise but hopefully should plateau and start improving.  Follow LFTs daily  I discussed with Dr. Orvan Falconerampbell regarding abnormal EBV serology results on 1/11.  He indicated that it is less likely for patient to have both CMV and EBV and suspects patient primarily has CMV infection which has triggered secondary changes in EBV serology.  Persisting sore throat/throat pain with some intermittent left earache but has not complained of headache in the last 3 days.  Magic mouthwash with lidocaine added.  Continue Cepacol lozenges.  As discussed with patient, weaning off of IV fentanyl.  Reported mild hives with oxycodone but tolerated after Benadryl, continue.  LFTs seem to be plateauing off.  INR normal.  Patient reports ongoing pain issues but has not looked in pain for discomfort for the last several days taking care of him.  I encouraged him to get out of bed and mobilize, minimize using IV pain medicines, hopefully by tomorrow if his symptoms are controlled on oral medications, LFTs remain stable, then can discharge home.  I had a long discussion with patient's father over the phone who advised that given patient's history of bipolar disorder and ADHD, there is history of difficult to control pain and other symptoms in the past.  I requested there help to motivate patient to mobilize and discuss discharge plans.  He verbalized understanding.  Thrombocytopenia Resolved.  Hypokalemia Replaced.  Acute kidney injury Resolved after IV fluids.  Avoid nephrotoxic's.  Allergic reaction Noted by EDP.  After receiving Reglan, Benadryl, Toradol and Zofran he developed erythroderma and pruritus.  Resolved.  Toradol was added to allergy list.  Suspected coffee-ground  emesis Patient had one episode of coffee-ground emesis couple of nights ago.  [facility] was consulted.  Suspected due to NSAID induced gastritis.  Seems to have resolved.  Continue PPI.  No work-up planned.  DVT prophylaxis: SCD Code Status: Full Family Communication: I discussed in detail with patient's father, updated care and answered questions. Disposition: DC home pending clinical improvement, hopefully tomorrow.   Consultants:  [facility].  Procedures:  None  Antimicrobials:  None   Subjective: Interviewed and examined with RN.  Overnight events noted.  Ongoing pain issues although has not looked in painful distress.  ROS: As above, otherwise negative.  Objective:  Vitals:   10/24/18 1640 10/24/18 2046 10/25/18 0532 10/25/18 0900  BP: 122/81 137/80 131/81 128/80  Pulse: 89 (!) 105 (!) 101 92  Resp: 18 18 19 18   Temp: 99.6 F (37.6 C) 100 F (37.8 C) 98 F (36.7 C) 98.4 F (36.9 C)  TempSrc: Oral Oral  Oral  SpO2: 100% 100% 98% 98%  Weight:  81.8 kg    Height:        Examination:  General exam: Pleasant young male, well built and nourished lying in bed.  Appears comfortable and in no distress. Respiratory system: Clear to auscultation.  No increased work of breathing.  Stable. Cardiovascular system: S1 & S2 heard, RRR. No JVD, murmurs, rubs, gallops or clicks. No pedal edema.  Stable. Gastrointestinal system: Abdomen is nondistended, soft and nontender. No organomegaly or masses felt. Normal bowel sounds heard.  Stable. Central nervous system: Alert and oriented. No focal neurological deficits.  Neck remains supple without stiffness. Extremities: Symmetric 5 x 5 power. Skin: No rashes, lesions or ulcers Psychiatry: Judgement and insight appear normal. Mood & affect mostly flat. ENT: Posterior pharyngeal wall looks erythematous without exudates.  No stridor.  Posterior pharyngeal wall is no worse and may be slightly better compared to yesterday.  Tongue  coated white.    Data Reviewed: I have personally reviewed following labs and imaging studies  CBC: Recent Labs  Lab 10/19/18 0514 10/20/18 0646 10/21/18 0737 10/22/18 1018 10/23/18 0548 10/25/18 0607  WBC 7.5 7.2 6.2 8.6 9.5 10.8*  NEUTROABS 5.4  --   --  3.0  --   --   HGB 13.7 13.1 14.0 14.6 15.0 14.9  HCT 41.0 38.0* 42.2 42.7 45.7 45.3  MCV 87.6 88.4 87.9 88.6 88.4 88.5  PLT 135* 141* 109* 147* 147* 151   Basic Metabolic Panel: Recent Labs  Lab 10/18/18 2110 10/19/18 0514 10/20/18 0646 10/21/18 0737 10/25/18 0607  NA 138 138 136 136 134*  K 4.2 3.2* 3.4* 3.8 3.8  CL 107 106 107 106 99  CO2 26 21* 22 22 26   GLUCOSE 113* 144* 125* 84 128*  BUN 13 13 7 9 10   CREATININE 1.13 1.34* 0.98 0.93 1.04  CALCIUM 8.8* 8.2* 7.8* 8.3* 8.2*   Liver Function Tests: Recent Labs  Lab 10/21/18 0737 10/22/18 0711 10/23/18 0548 10/24/18 0543 10/25/18 0607  AST 333* 366* 346* 311* 244*  ALT 325* 466* 479* 488* 427*  ALKPHOS 230* 284* 355* 390* 439*  BILITOT 3.2* 3.6* 3.2* 3.3* 4.1*  PROT 5.4* 5.8* 6.1* 6.1* 6.4*  ALBUMIN 2.8* 2.9* 3.0* 2.9* 3.0*   Coagulation Profile: Recent Labs  Lab 10/19/18 0310 10/20/18 0998 10/23/18 0548  INR 1.20 1.16 0.96   Cardiac Enzymes: Recent Labs  Lab 10/19/18 0514  CKTOTAL 168     Recent Results (from the past 240 hour(s))  Culture, blood (routine x 2)     Status: None   Collection Time: 10/19/18  3:10 AM  Result Value Ref Range Status   Specimen Description BLOOD LEFT FOREARM  Final   Special Requests   Final    BOTTLES DRAWN AEROBIC AND ANAEROBIC Blood Culture results may not be optimal due to an inadequate volume of blood received in culture bottles   Culture   Final    NO GROWTH 5 DAYS Performed at [facility], [address] [place], [place] [address]    Report Status 10/24/2018 FINAL  Final  Culture, blood (routine x 2)     Status: None   Collection Time: 10/19/18  3:15 AM  Result Value Ref Range Status    Specimen Description BLOOD LEFT FOREARM  Final   Special Requests   Final    BOTTLES DRAWN AEROBIC AND ANAEROBIC Blood Culture results may not be optimal due to an inadequate volume of blood received in culture bottles   Culture   Final    NO GROWTH 5 DAYS Performed at [facility], [address], [place], [place] [address]    Report Status 10/24/2018 FINAL  Final  Respiratory Panel by PCR     Status: None   Collection Time: 10/19/18  4:44 AM  Result Value Ref Range Status   Adenovirus NOT DETECTED NOT DETECTED Final   Coronavirus 229E NOT DETECTED NOT DETECTED Final   Coronavirus HKU1 NOT DETECTED NOT DETECTED Final   Coronavirus NL63 NOT DETECTED NOT DETECTED Final   Coronavirus OC43 NOT DETECTED NOT DETECTED Final   Metapneumovirus NOT DETECTED NOT DETECTED Final   Rhinovirus / Enterovirus NOT DETECTED NOT DETECTED Final   Influenza A NOT DETECTED NOT DETECTED Final   Influenza B NOT DETECTED NOT DETECTED Final   Parainfluenza Virus 1 NOT DETECTED NOT DETECTED Final   Parainfluenza Virus 2 NOT DETECTED NOT DETECTED Final   Parainfluenza Virus 3 NOT DETECTED NOT DETECTED Final   Parainfluenza Virus 4 NOT DETECTED NOT DETECTED Final   Respiratory Syncytial Virus NOT DETECTED NOT DETECTED Final   Bordetella pertussis NOT DETECTED NOT DETECTED Final   Chlamydophila pneumoniae NOT DETECTED NOT DETECTED Final   Mycoplasma pneumoniae NOT DETECTED NOT DETECTED Final    Comment: Performed at [facility], [address] [place], [place] [address]         Radiology Studies: No results found.      Scheduled Meds: . oxyCODONE  5 mg Oral TID  . pantoprazole  40 mg Oral BID   Continuous Infusions: . sodium chloride 1,000 mL (10/20/18 1021)     LOS: 7 days     Marcellus Scott, MD, FACP, [facility]. [facility] Pager [phone]  If 7PM-7AM, please contact night-coverage [email or web address] Password [facility] 10/25/2018, 11:28 AM

## 2018-10-26 LAB — HEPATIC FUNCTION PANEL
ALT: 365 U/L — ABNORMAL HIGH (ref 0–44)
AST: 197 U/L — ABNORMAL HIGH (ref 15–41)
Albumin: 3 g/dL — ABNORMAL LOW (ref 3.5–5.0)
Alkaline Phosphatase: 404 U/L — ABNORMAL HIGH (ref 38–126)
Bilirubin, Direct: 2.4 mg/dL — ABNORMAL HIGH (ref 0.0–0.2)
Indirect Bilirubin: 1.9 mg/dL — ABNORMAL HIGH (ref 0.3–0.9)
Total Bilirubin: 4.3 mg/dL — ABNORMAL HIGH (ref 0.3–1.2)
Total Protein: 6.5 g/dL (ref 6.5–8.1)

## 2018-10-26 MED ORDER — UNABLE TO FIND: 0 refills | Status: DC

## 2018-10-26 MED ORDER — PANTOPRAZOLE SODIUM 40 MG PO TBEC: 40.0000 mg | DELAYED_RELEASE_TABLET | Freq: Two times a day (BID) | ORAL | 0 refills | Status: DC

## 2018-10-26 MED ORDER — OXYCODONE HCL 5 MG PO TABS: 5.0000 mg | ORAL_TABLET | Freq: Three times a day (TID) | ORAL | 0 refills | Status: DC | PRN

## 2018-10-26 MED ORDER — MAGIC MOUTHWASH W/LIDOCAINE: 10.0000 mL | Freq: Four times a day (QID) | ORAL | 0 refills | Status: DC | PRN

## 2018-10-26 NOTE — Progress Notes (Signed)
Blake Bradley to be D/C'd Home per MD order.  Discussed prescriptions and follow up appointments with the patient. Prescriptions given to patient, medication list explained in detail. Pt verbalized understanding.  Allergies as of 10/26/2018      Reactions   Codeine Anaphylaxis   Ketorolac Rash   Possible rash, itching. Occurred with other meds. Tolerates ibuprofen   Morphine And Related Hives   Prednisone Hives      Medication List    STOP taking these medications   dicyclomine 20 MG tablet Commonly known as:  BENTYL   ibuprofen 400 MG tablet Commonly known as:  ADVIL,MOTRIN   imipramine 25 MG tablet Commonly known as:  TOFRANIL   indomethacin 25 MG capsule Commonly known as:  INDOCIN   methocarbamol 500 MG tablet Commonly known as:  ROBAXIN   promethazine 25 MG tablet Commonly known as:  PHENERGAN     TAKE these medications   magic mouthwash w/lidocaine Soln Take 10 mLs by mouth 4 (four) times daily as needed for mouth pain (Throat pain.).   oxyCODONE 5 MG immediate release tablet Commonly known as:  Oxy IR/ROXICODONE Take 1 tablet (5 mg total) by mouth every 8 (eight) hours as needed for moderate pain or severe pain.   pantoprazole 40 MG tablet Commonly known as:  PROTONIX Take 1 tablet (40 mg total) by mouth 2 (two) times daily.   UNABLE TO FIND Patient to have repeat labs (CBC, BMP & LFTs) in 1 week from hospital discharge.       Vitals:   10/26/18 0437 10/26/18 0824  BP: 130/82 129/85  Pulse: 100 (!) 119  Resp: 20   Temp: 100 F (37.8 C) 98.7 F (37.1 C)  SpO2: 99% 94%    Skin clean, dry and intact without evidence of skin break down, no evidence of skin tears noted. IV catheter discontinued intact. Site without signs and symptoms of complications. Dressing and pressure applied. Pt denies pain at this time. No complaints noted.  An After Visit Summary was printed and given to the patient. Patient escorted via WC, and D/C home via private auto.

## 2018-10-26 NOTE — Care Management Note (Signed)
Case Management Note Hortencia Conradi, RN MSN CCM Transitions of Care [facility] [place] [phone]  Patient Details  Name: Blake Bradley MRN: [id number] Date of Birth: 17-Dec-1990  Subjective/Objective:          Acute hepatitis          Action/Plan: PTA home with family. Independent. No HH/DME needs. No transportation needs. No insurance. No PCP. Scheduled appointment at [facility] for 11/17/2018 8:50 am. Patient expressed appreciation. Will continue to follow for transition of care needs.   Expected Discharge Date:                  Expected Discharge Plan:  Home/[facility] Care  In-House Referral:  Financial Counselor  Discharge planning Services  CM Consult, Follow-up appt scheduled  Post Acute Care Choice:  NA Choice offered to:  NA  DME Arranged:  N/A DME Agency:  NA  HH Arranged:  NA HH Agency:  NA  Status of Service:  Completed, signed off  If discussed at Long Length of Stay Meetings, dates discussed:    Additional Comments: 10/16/2018-1406-Anticipated transition home today. Received call from Dr. Waymon Amato that patient needed a follow up call for next week. Confirmed patient no longer a patient of Dr. Vaughan Basta. Able to get patient a hospital follow up with [facility] at [facility] for Thursday, 11/04/2018 at 10:50. Pt will need scripts for blood work (CBC, BMP, and LFTs) for transition home. Discussed change of appointment with patient and follow up blood work. Patient verbalized understanding.   Bess Kinds, RN 10/26/2018, 2:06 PM

## 2018-10-26 NOTE — Discharge Summary (Signed)
Physician Discharge Summary  Blake Bradley ZOX:[id number] DOB: 01/17/1991  PCP: Patient, No Pcp Per  Admit date: 10/18/2018 Discharge date: 10/26/2018  Recommendations for Outpatient Follow-up:  1. [facility] on 11/04/2018 at 10:50 AM.  To be seen with repeat labs (CBC, BMP & LFTs). 2. Dr. 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    Confirmed to have acute CMV infection.  ID consulted.  Single episode of mild coffee-ground emesis.  [facility] consulted.  Assessment & Plan:  Acute CMV mononucleosis with hepatitis and pharyngitis Extensive infectious work-up initially not revealing for cause.   Blood cultures x2 and RSV panel negative. Chest x-ray, CT head and right upper quadrant ultrasound without acute findings. ID input appreciated, low index of suspicion for meningitis and hence no LP done. CMV PCR weakly positive.  CMV IgM elevated (>240) and PCR positive EBV VCA IgG, IgM positive.  Early antigen Ab, IgG: Positive. EBV NA IgG negative. HIV viral load negative.  ID was consulted and assisted with evaluation and management. As discussed with Dr. Orvan Falconerampbell, ID, patient has acute CMV mononucleosis which would explain his recent fevers, sore throat, headache and hepatitis.  Patient was  advised that this will be treated supportively and may take several days or even weeks to completely resolve.  [facility] was consulted for an episode of coffee-ground emesis and hepatitis. Hepatis work up has been negative thus far (negative Hep A/BC, HIV; Normal Lipase, ASA, APAP)  I discussed with Dr. Orvan Falconerampbell regarding abnormal EBV serology results on 1/11.  He indicated that it is less likely for patient to have both CMV and EBV and suspects patient primarily has CMV infection which has triggered secondary changes in EBV serology.  Over the next several days, patient was treated supportively with soft diet, Magic mouthwash with lidocaine and Cepacol lozenges for throat pain, pain management which was difficult-patient had several allergies, briefly received IV fentanyl and scheduled oxycodone, continued to complain of pain despite not appearing in painful discomfort.  Despite multiple recommendations to mobilize, mostly stayed in bed.  Overall appears improved, not in any distress, stable vital signs, LFTs trending down and may take a few more days to normalize.  He has been advised to rest, gradually increase activity, orally hydrate adequately, soft diet and then advance as tolerated, avoid NSAIDs and acetaminophen, as needed short supply of oxycodone given and close outpatient follow-up with PCP with repeat labs.  I reviewed the [place] controlled substance database and patient has recently not used any controlled substances.  Thrombocytopenia Resolved.  Hypokalemia Replaced.  Acute kidney injury Resolved after IV fluids.  Avoid nephrotoxic's.  Allergic reaction Noted by EDP.  After receiving Reglan, Benadryl, Toradol and Zofran he developed erythroderma and pruritus.  Resolved.  Toradol was added to allergy list.  Suspected coffee-ground emesis Patient had one episode of coffee-ground emesis couple of nights ago.  [facility]  was consulted.  Suspected due to NSAID  induced gastritis.  Seems to have resolved.  Continue PPI.  No work-up planned.    Consultants:  [facility].  Procedures:  None   Discharge Instructions  Discharge Instructions    Call MD for:  difficulty breathing, headache or visual disturbances   Complete by:  As directed    Call MD for:  extreme fatigue   Complete by:  As directed    Call MD for:  hives   Complete by:  As directed    Call MD for:  persistant dizziness or light-headedness   Complete by:  As directed    Call MD for:  persistant nausea and vomiting   Complete by:  As directed    Call MD for:  severe uncontrolled pain   Complete by:  As directed    Call MD for:  temperature >100.4   Complete by:  As directed    Diet general   Complete by:  As directed    Increase activity slowly   Complete by:  As directed        Medication List    STOP taking these medications   dicyclomine 20 MG tablet Commonly known as:  BENTYL   ibuprofen 400 MG tablet Commonly known as:  ADVIL,MOTRIN   imipramine 25 MG tablet Commonly known as:  TOFRANIL   indomethacin 25 MG capsule Commonly known as:  INDOCIN   methocarbamol 500 MG tablet Commonly known as:  ROBAXIN   promethazine 25 MG tablet Commonly known as:  PHENERGAN     TAKE these medications   magic mouthwash w/lidocaine Soln Take 10 mLs by mouth 4 (four) times daily as needed for mouth pain (Throat pain.).   oxyCODONE 5 MG immediate release tablet Commonly known as:  Oxy IR/ROXICODONE Take 1 tablet (5 mg total) by mouth every 8 (eight) hours as needed for moderate pain or severe pain.   pantoprazole 40 MG tablet Commonly known as:  PROTONIX Take 1 tablet (40 mg total) by mouth 2 (two) times daily.   UNABLE TO FIND Patient to have repeat labs (CBC, BMP & LFTs) in 1 week from hospital discharge.      Follow-up Information    [facility]. Go on 11/04/2018.   Why:  10:50 am-please arrive 10-15 early for appointment take  scripts for labs (BMP, CBC, LFT) to be drawn Contact information: [address], [address] [place] [address]       Iva Boop, MD. Schedule an appointment as soon as possible for a visit.   Specialty:  Gastroenterology Contact information: [address] [place] [address] [phone]          Allergies  Allergen Reactions  . Codeine Anaphylaxis  . Ketorolac Rash    Possible rash, itching. Occurred with other meds. Tolerates ibuprofen  . Morphine And Related Hives  . Prednisone Hives      Procedures/Studies: Dg Chest 2 View  Result Date: 10/19/2018 CLINICAL DATA:  Fever, headache EXAM: CHEST - 2 VIEW COMPARISON:  06/07/2017 FINDINGS: Heart and mediastinal contours are within normal limits. No focal opacities or effusions. No acute bony abnormality. IMPRESSION: No active cardiopulmonary disease. Electronically Signed   By: Charlett Nose M.D.   On: 10/19/2018 10:43   Ct Head Wo Contrast  Result Date: 10/19/2018 CLINICAL DATA:  Headache, acute, severe, worst HA of life EXAM: CT HEAD WITHOUT CONTRAST TECHNIQUE: Contiguous axial images were obtained from the base  of the skull through the vertex without intravenous contrast. COMPARISON:  Brain MRI 06/29/2017 FINDINGS: Brain: No intracranial hemorrhage, mass effect, or midline shift. No hydrocephalus. The basilar cisterns are patent. No evidence of territorial infarct or acute ischemia. No extra-axial or intracranial fluid collection. Vascular: No hyperdense vessel. Skull: No fracture or focal lesion. Sinuses/Orbits: Mucosal thickening of the ethmoid air cells and right side of sphenoid sinus. Small fluid level in right maxillary sinus. Mucous retention cyst on prior MRI is not included in the field of view. Other: None. IMPRESSION: 1. No acute intracranial abnormality. 2. Mild mucosal thickening of the ethmoid air cells and small fluid level in right maxillary sinus. Electronically Signed   By: Narda Rutherford M.D.   On: 10/19/2018 02:44   US Abdomen Limited  Result Date: 10/19/2018 CLINICAL DATA:  Elevated LFT EXAM: ULTRASOUND ABDOMEN LIMITED RIGHT UPPER QUADRANT COMPARISON:  CT 10/27/2016 FINDINGS: Gallbladder: No gallstones or wall thickening visualized. No sonographic Murphy sign noted by sonographer. Common bile duct: Diameter: 2.7 mm Liver: No focal lesion identified. Within normal limits in parenchymal echogenicity. Portal vein is patent on color Doppler imaging with normal direction of blood flow towards the liver. IMPRESSION: Negative right upper quadrant abdominal ultrasound Electronically Signed   By: Jasmine Pang M.D.   On: 10/19/2018 00:00      Subjective: Patient interviewed and examined along with nurse.  Throat pain present.  At times radiates to left ear.  Eating a little, limited due to throat pain.  No fevers.  No nausea or vomiting.  Ambulating in the room.  Did not complain of headache or body aches.  As per RN, no acute issues noted.  Discharge Exam:  Vitals:   10/25/18 1959 10/25/18 2034 10/26/18 0437 10/26/18 0824  BP: 135/82 129/82 130/82 129/85  Pulse: 95 99 100 (!) 119  Resp: (!) 45 20 20   Temp: 98.9 F (37.2 C)  100 F (37.8 C) 98.7 F (37.1 C)  TempSrc: Oral  Oral Oral  SpO2: (!) 69% 100% 99% 94%  Weight:      Height:        General exam: Pleasant young male, well built and nourished lying in bed.  Appears comfortable and in no distress. Respiratory system: Clear to auscultation.  No increased work of breathing.   Cardiovascular system: S1 & S2 heard, RRR. No JVD, murmurs, rubs, gallops or clicks. No pedal edema.   Gastrointestinal system: Abdomen is nondistended, soft and nontender. No organomegaly or masses felt. Normal bowel sounds heard.  . Central nervous system: Alert and oriented. No focal neurological deficits.  Neck remains supple without stiffness. Extremities: Symmetric 5 x 5 power. Skin: No rashes, lesions or ulcers Psychiatry: Judgement  and insight appear normal. Mood & affect mostly flat. ENT: Posterior pharyngeal wall erythema seems to be slightly better compared to yesterday.  No stridor.    The results of significant diagnostics from this hospitalization (including imaging, microbiology, ancillary and laboratory) are listed below for reference.     Microbiology: Recent Results (from the past 240 hour(s))  Culture, blood (routine x 2)     Status: None   Collection Time: 10/19/18  3:10 AM  Result Value Ref Range Status   Specimen Description BLOOD LEFT FOREARM  Final   Special Requests   Final    BOTTLES DRAWN AEROBIC AND ANAEROBIC Blood Culture results may not be optimal due to an inadequate volume of blood received in culture bottles   Culture   Final  NO GROWTH 5 DAYS Performed at [facility], [address] [place], [place] [address]    Report Status 10/24/2018 FINAL  Final  Culture, blood (routine x 2)     Status: None   Collection Time: 10/19/18  3:15 AM  Result Value Ref Range Status   Specimen Description BLOOD LEFT FOREARM  Final   Special Requests   Final    BOTTLES DRAWN AEROBIC AND ANAEROBIC Blood Culture results may not be optimal due to an inadequate volume of blood received in culture bottles   Culture   Final    NO GROWTH 5 DAYS Performed at [facility], [address] [place] [address], [place], [place] [address]    Report Status 10/24/2018 FINAL  Final  Respiratory Panel by PCR     Status: None   Collection Time: 10/19/18  4:44 AM  Result Value Ref Range Status   Adenovirus NOT DETECTED NOT DETECTED Final   Coronavirus 229E NOT DETECTED NOT DETECTED Final   Coronavirus HKU1 NOT DETECTED NOT DETECTED Final   Coronavirus NL63 NOT DETECTED NOT DETECTED Final   Coronavirus OC43 NOT DETECTED NOT DETECTED Final   Metapneumovirus NOT DETECTED NOT DETECTED Final   Rhinovirus / Enterovirus NOT DETECTED NOT DETECTED Final   Influenza A NOT DETECTED NOT DETECTED Final   Influenza B NOT DETECTED NOT  DETECTED Final   Parainfluenza Virus 1 NOT DETECTED NOT DETECTED Final   Parainfluenza Virus 2 NOT DETECTED NOT DETECTED Final   Parainfluenza Virus 3 NOT DETECTED NOT DETECTED Final   Parainfluenza Virus 4 NOT DETECTED NOT DETECTED Final   Respiratory Syncytial Virus NOT DETECTED NOT DETECTED Final   Bordetella pertussis NOT DETECTED NOT DETECTED Final   Chlamydophila pneumoniae NOT DETECTED NOT DETECTED Final   Mycoplasma pneumoniae NOT DETECTED NOT DETECTED Final    Comment: Performed at [facility], [address] [place], [place] [address]     Labs: CBC: Recent Labs  Lab 10/20/18 [id number] 10/21/18 0737 10/22/18 1018 10/23/18 0548 10/25/18 0607  WBC 7.2 6.2 8.6 9.5 10.8*  NEUTROABS  --   --  3.0  --   --   HGB 13.1 14.0 14.6 15.0 14.9  HCT 38.0* 42.2 42.7 45.7 45.3  MCV 88.4 87.9 88.6 88.4 88.5  PLT 141* 109* 147* 147* 151   Basic Metabolic Panel: Recent Labs  Lab 10/20/18 0646 10/21/18 0737 10/25/18 0607  NA 136 136 134*  K 3.4* 3.8 3.8  CL 107 106 99  CO2 22 22 26   GLUCOSE 125* 84 128*  BUN 7 9 10   CREATININE 0.98 0.93 1.04  CALCIUM 7.8* 8.3* 8.2*   Liver Function Tests: Recent Labs  Lab 10/22/18 0711 10/23/18 0548 10/24/18 0543 10/25/18 0607 10/26/18 0523  AST 366* 346* 311* 244* 197*  ALT 466* 479* 488* 427* 365*  ALKPHOS 284* 355* 390* 439* 404*  BILITOT 3.6* 3.2* 3.3* 4.1* 4.3*  PROT 5.8* 6.1* 6.1* 6.4* 6.5  ALBUMIN 2.9* 3.0* 2.9* 3.0* 3.0*   CBG: Recent Labs  Lab 10/23/18 2110  GLUCAP 120*   Urinalysis    Component Value Date/Time   COLORURINE AMBER (A) 10/18/2018 2105   APPEARANCEUR CLEAR 10/18/2018 2105   LABSPEC 1.026 10/18/2018 2105   PHURINE 6.0 10/18/2018 2105   GLUCOSEU NEGATIVE 10/18/2018 2105   HGBUR NEGATIVE 10/18/2018 2105   BILIRUBINUR SMALL (A) 10/18/2018 2105   KETONESUR NEGATIVE 10/18/2018 2105   PROTEINUR NEGATIVE 10/18/2018 2105   UROBILINOGEN 0.2 01/17/2015 2140  NITRITE NEGATIVE 10/18/2018 2105    LEUKOCYTESUR NEGATIVE 10/18/2018 2105    I had discussed in detail with patient's father on day prior to discharge, updated care and answered questions.  Time coordinating discharge: 40 minutes  SIGNED:  Marcellus Scott, MD, FACP, [facility]. [facility] Pager [phone]  If 7PM-7AM, please contact night-coverage [email or web address] Password [email or web address] 10/26/2018, 3:00 PM

## 2018-10-26 NOTE — Discharge Instructions (Addendum)

## 2018-10-31 ENCOUNTER — Encounter (HOSPITAL_COMMUNITY): Payer: Self-pay | Admitting: Emergency Medicine

## 2018-10-31 ENCOUNTER — Emergency Department (HOSPITAL_COMMUNITY)
Admission: EM | Admit: 2018-10-31 | Discharge: 2018-11-01 | Disposition: A | Discharge status: Home / Self Care | Payer: Medicaid Other | Attending: Emergency Medicine | Admitting: Emergency Medicine

## 2018-10-31 ENCOUNTER — Emergency Department (HOSPITAL_COMMUNITY): Payer: Medicaid Other

## 2018-10-31 DIAGNOSIS — R55 Syncope and collapse: Secondary | ICD-10-CM | POA: Diagnosis present

## 2018-10-31 DIAGNOSIS — E860 Dehydration: Secondary | ICD-10-CM | POA: Insufficient documentation

## 2018-10-31 DIAGNOSIS — I951 Orthostatic hypotension: Secondary | ICD-10-CM | POA: Diagnosis not present

## 2018-10-31 LAB — URINALYSIS, ROUTINE W REFLEX MICROSCOPIC
Bacteria, UA: NONE SEEN
Glucose, UA: NEGATIVE mg/dL
Hgb urine dipstick: NEGATIVE
Ketones, ur: 20 mg/dL — AB
Leukocytes, UA: NEGATIVE
Nitrite: NEGATIVE
Protein, ur: 30 mg/dL — AB
Specific Gravity, Urine: 1.025 (ref 1.005–1.030)
pH: 6 (ref 5.0–8.0)

## 2018-10-31 LAB — CBC WITH DIFFERENTIAL/PLATELET
Abs Immature Granulocytes: 0.06 10*3/uL (ref 0.00–0.07)
BASOS PCT: 1 %
Basophils Absolute: 0.1 10*3/uL (ref 0.0–0.1)
EOS ABS: 0.2 10*3/uL (ref 0.0–0.5)
Eosinophils Relative: 2 %
HCT: 48.4 % (ref 39.0–52.0)
Hemoglobin: 15.6 g/dL (ref 13.0–17.0)
Immature Granulocytes: 1 %
Lymphocytes Relative: 56 %
Lymphs Abs: 4.6 10*3/uL — ABNORMAL HIGH (ref 0.7–4.0)
MCH: 28.7 pg (ref 26.0–34.0)
MCHC: 32.2 g/dL (ref 30.0–36.0)
MCV: 89 fL (ref 80.0–100.0)
Monocytes Absolute: 0.7 10*3/uL (ref 0.1–1.0)
Monocytes Relative: 8 %
Neutro Abs: 2.7 10*3/uL (ref 1.7–7.7)
Neutrophils Relative %: 32 %
Platelets: 238 10*3/uL (ref 150–400)
RBC: 5.44 MIL/uL (ref 4.22–5.81)
RDW: 13 % (ref 11.5–15.5)
WBC: 8.3 10*3/uL (ref 4.0–10.5)
nRBC: 0 % (ref 0.0–0.2)

## 2018-10-31 LAB — COMPREHENSIVE METABOLIC PANEL
ALT: 305 U/L — ABNORMAL HIGH (ref 0–44)
AST: 221 U/L — ABNORMAL HIGH (ref 15–41)
Albumin: 3.4 g/dL — ABNORMAL LOW (ref 3.5–5.0)
Alkaline Phosphatase: 460 U/L — ABNORMAL HIGH (ref 38–126)
Anion gap: 13 (ref 5–15)
BUN: 10 mg/dL (ref 6–20)
CALCIUM: 8.8 mg/dL — AB (ref 8.9–10.3)
CO2: 24 mmol/L (ref 22–32)
Chloride: 99 mmol/L (ref 98–111)
Creatinine, Ser: 1.24 mg/dL (ref 0.61–1.24)
GFR calc Af Amer: >60 mL/min (ref >60)
GFR calc non Af Amer: >60 mL/min (ref >60)
Glucose, Bld: 101 mg/dL — ABNORMAL HIGH (ref 70–99)
Potassium: 3.9 mmol/L (ref 3.5–5.1)
Sodium: 136 mmol/L (ref 135–145)
TOTAL PROTEIN: 6.9 g/dL (ref 6.5–8.1)
Total Bilirubin: 2.9 mg/dL — ABNORMAL HIGH (ref 0.3–1.2)

## 2018-10-31 LAB — RAPID URINE DRUG SCREEN, HOSP PERFORMED
Amphetamines: NOT DETECTED
Barbiturates: NOT DETECTED
Benzodiazepines: NOT DETECTED
Cocaine: NOT DETECTED
Opiates: NOT DETECTED
Tetrahydrocannabinol: NOT DETECTED

## 2018-10-31 LAB — CK: Total CK: 99 U/L (ref 49–397)

## 2018-10-31 LAB — LIPASE, BLOOD: Lipase: 99 U/L — ABNORMAL HIGH (ref 11–51)

## 2018-10-31 MED ORDER — SODIUM CHLORIDE 0.9 % IV BOLUS
1000.0000 mL | Freq: Once | INTRAVENOUS | Status: AC
  Administered 2018-10-31: 1000 mL via INTRAVENOUS

## 2018-10-31 MED ORDER — ZOLPIDEM TARTRATE 5 MG PO TABS: 5.0000 mg | ORAL_TABLET | Freq: Every evening | ORAL | 0 refills | Status: DC | PRN

## 2018-10-31 MED ORDER — ARTIFICIAL TEARS OPHTHALMIC OINT: TOPICAL_OINTMENT | OPHTHALMIC | 1 refills | Status: DC | PRN

## 2018-10-31 NOTE — ED Triage Notes (Signed)
Pt reports syncope while in the shower.  Just discharged from last week.  Feels tired, weak and tearful in room.  Urine is dark according to pt.
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
## 2018-11-01 NOTE — Discharge Instructions (Signed)
Repeat your lab tests in 2-3 weeks (cbc/cmp/lipase). Return for worsening in terms including yellowing of your eyes, swelling of your abdomen, nausea, vomiting, inability to hold down foods or fluids. Sure to use your Lacri-Lube to keep your right eye moistened and be sure to patch your eye at night until your symptoms have resolved. Get help right away if you: Have chest pain. Have a fast or irregular heartbeat. Develop numbness in any part of your body. Cannot move your arms or your legs. Have trouble speaking. Become sweaty or feel light-headed. Faint. Feel short of breath. Have trouble staying awake. Feel confused.

## 2018-11-01 NOTE — ED Notes (Signed)
Pt and father understand discharge instructions and prescriptions.

## 2018-11-04 ENCOUNTER — Inpatient Hospital Stay: Payer: Self-pay | Admitting: Family Medicine

## 2018-11-17 ENCOUNTER — Inpatient Hospital Stay (INDEPENDENT_AMBULATORY_CARE_PROVIDER_SITE_OTHER): Payer: Self-pay | Admitting: Nurse Practitioner

## 2019-04-28 ENCOUNTER — Other Ambulatory Visit: Payer: Self-pay

## 2019-04-28 ENCOUNTER — Emergency Department (HOSPITAL_COMMUNITY)
Admission: EM | Admit: 2019-04-28 | Discharge: 2019-04-29 | Discharge status: Against Medical Advice | Payer: Medicaid Other | Attending: Emergency Medicine | Admitting: Emergency Medicine

## 2019-04-28 DIAGNOSIS — R5383 Other fatigue: Secondary | ICD-10-CM | POA: Insufficient documentation

## 2019-04-28 DIAGNOSIS — R21 Rash and other nonspecific skin eruption: Secondary | ICD-10-CM | POA: Diagnosis not present

## 2019-04-28 DIAGNOSIS — R51 Headache: Secondary | ICD-10-CM | POA: Diagnosis not present

## 2019-04-28 DIAGNOSIS — R1110 Vomiting, unspecified: Secondary | ICD-10-CM | POA: Diagnosis not present

## 2019-04-28 DIAGNOSIS — Z5329 Procedure and treatment not carried out because of patient's decision for other reasons: Secondary | ICD-10-CM | POA: Insufficient documentation

## 2019-04-28 DIAGNOSIS — Z79899 Other long term (current) drug therapy: Secondary | ICD-10-CM | POA: Diagnosis not present

## 2019-04-28 DIAGNOSIS — R509 Fever, unspecified: Secondary | ICD-10-CM

## 2019-04-28 DIAGNOSIS — M791 Myalgia, unspecified site: Secondary | ICD-10-CM | POA: Diagnosis not present

## 2019-04-28 DIAGNOSIS — Z20828 Contact with and (suspected) exposure to other viral communicable diseases: Secondary | ICD-10-CM | POA: Diagnosis not present

## 2019-04-28 DIAGNOSIS — R109 Unspecified abdominal pain: Secondary | ICD-10-CM | POA: Diagnosis not present

## 2019-04-28 DIAGNOSIS — R5381 Other malaise: Secondary | ICD-10-CM | POA: Insufficient documentation

## 2019-04-28 MED ORDER — SODIUM CHLORIDE 0.9 % IV BOLUS
1000.0000 mL | Freq: Once | INTRAVENOUS | Status: AC
  Administered 2019-04-28: 1000 mL via INTRAVENOUS
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
## 2019-04-29 ENCOUNTER — Emergency Department (HOSPITAL_COMMUNITY)
Admission: EM | Admit: 2019-04-29 | Discharge: 2019-04-30 | Disposition: A | Discharge status: Home / Self Care | Payer: Medicaid Other | Source: Home / Self Care | Attending: Emergency Medicine | Admitting: Emergency Medicine

## 2019-04-29 ENCOUNTER — Other Ambulatory Visit: Payer: Self-pay

## 2019-04-29 DIAGNOSIS — B349 Viral infection, unspecified: Secondary | ICD-10-CM | POA: Insufficient documentation

## 2019-04-29 DIAGNOSIS — F909 Attention-deficit hyperactivity disorder, unspecified type: Secondary | ICD-10-CM | POA: Insufficient documentation

## 2019-04-29 DIAGNOSIS — F319 Bipolar disorder, unspecified: Secondary | ICD-10-CM | POA: Insufficient documentation

## 2019-04-29 DIAGNOSIS — Z79899 Other long term (current) drug therapy: Secondary | ICD-10-CM | POA: Insufficient documentation

## 2019-04-29 LAB — COMPREHENSIVE METABOLIC PANEL
ALT: 102 U/L — ABNORMAL HIGH (ref 0–44)
AST: 85 U/L — ABNORMAL HIGH (ref 15–41)
Albumin: 3.9 g/dL (ref 3.5–5.0)
Alkaline Phosphatase: 208 U/L — ABNORMAL HIGH (ref 38–126)
Anion gap: 12 (ref 5–15)
BUN: 11 mg/dL (ref 6–20)
CO2: 22 mmol/L (ref 22–32)
Calcium: 8.7 mg/dL — ABNORMAL LOW (ref 8.9–10.3)
Chloride: 100 mmol/L (ref 98–111)
Creatinine, Ser: 1.16 mg/dL (ref 0.61–1.24)
GFR calc Af Amer: >60 mL/min (ref >60)
GFR calc non Af Amer: >60 mL/min (ref >60)
Glucose, Bld: 116 mg/dL — ABNORMAL HIGH (ref 70–99)
Potassium: 3.1 mmol/L — ABNORMAL LOW (ref 3.5–5.1)
Sodium: 134 mmol/L — ABNORMAL LOW (ref 135–145)
Total Bilirubin: 3.1 mg/dL — ABNORMAL HIGH (ref 0.3–1.2)
Total Protein: 6.7 g/dL (ref 6.5–8.1)

## 2019-04-29 LAB — CBC WITH DIFFERENTIAL/PLATELET
Abs Immature Granulocytes: 0.01 10*3/uL (ref 0.00–0.07)
Basophils Absolute: 0 10*3/uL (ref 0.0–0.1)
Basophils Relative: 1 %
Eosinophils Absolute: 0 10*3/uL (ref 0.0–0.5)
Eosinophils Relative: 0 %
HCT: 46.5 % (ref 39.0–52.0)
Hemoglobin: 16.1 g/dL (ref 13.0–17.0)
Immature Granulocytes: 0 %
Lymphocytes Relative: 36 %
Lymphs Abs: 1.5 10*3/uL (ref 0.7–4.0)
MCH: 29.8 pg (ref 26.0–34.0)
MCHC: 34.6 g/dL (ref 30.0–36.0)
MCV: 86.1 fL (ref 80.0–100.0)
Monocytes Absolute: 0.3 10*3/uL (ref 0.1–1.0)
Monocytes Relative: 7 %
Neutro Abs: 2.3 10*3/uL (ref 1.7–7.7)
Neutrophils Relative %: 56 %
Platelets: 120 10*3/uL — ABNORMAL LOW (ref 150–400)
RBC: 5.4 MIL/uL (ref 4.22–5.81)
RDW: 12.6 % (ref 11.5–15.5)
WBC: 4.1 10*3/uL (ref 4.0–10.5)
nRBC: 0 % (ref 0.0–0.2)

## 2019-04-29 LAB — LIPASE, BLOOD: Lipase: 27 U/L (ref 11–51)

## 2019-04-29 LAB — RAPID URINE DRUG SCREEN, HOSP PERFORMED
Amphetamines: NOT DETECTED
Barbiturates: NOT DETECTED
Benzodiazepines: POSITIVE — AB
Cocaine: NOT DETECTED
Opiates: POSITIVE — AB
Tetrahydrocannabinol: NOT DETECTED

## 2019-04-29 LAB — CK: Total CK: 74 U/L (ref 49–397)

## 2019-04-29 LAB — SARS CORONAVIRUS 2 BY RT PCR (HOSPITAL ORDER, PERFORMED IN ~~LOC~~ HOSPITAL LAB): SARS Coronavirus 2: NEGATIVE

## 2019-04-29 LAB — LACTIC ACID, PLASMA: Lactic Acid, Venous: 1.4 mmol/L (ref 0.5–1.9)

## 2019-04-29 MED ORDER — IBUPROFEN 800 MG PO TABS
800.0000 mg | ORAL_TABLET | Freq: Once | ORAL | Status: AC
  Administered 2019-04-29: 800 mg via ORAL
  Filled 2019-04-29: qty 1

## 2019-04-29 MED ORDER — IBUPROFEN 800 MG PO TABS
800.0000 mg | ORAL_TABLET | Freq: Once | ORAL | Status: AC
  Administered 2019-04-30: 800 mg via ORAL
  Filled 2019-04-29: qty 1

## 2019-04-29 MED ORDER — SODIUM CHLORIDE 0.9 % IV BOLUS
1000.0000 mL | Freq: Once | INTRAVENOUS | Status: AC
  Administered 2019-04-29: 1000 mL via INTRAVENOUS

## 2019-04-29 MED ORDER — DIPHENHYDRAMINE HCL 50 MG/ML IJ SOLN
25.0000 mg | Freq: Once | INTRAMUSCULAR | Status: AC
  Administered 2019-04-30: 25 mg via INTRAVENOUS
  Filled 2019-04-29: qty 1

## 2019-04-29 MED ORDER — SODIUM CHLORIDE 0.9 % IV BOLUS (SEPSIS)
1000.0000 mL | Freq: Once | INTRAVENOUS | Status: AC
  Administered 2019-04-30: 1000 mL via INTRAVENOUS

## 2019-04-29 MED ORDER — METOCLOPRAMIDE HCL 5 MG/ML IJ SOLN
10.0000 mg | Freq: Once | INTRAMUSCULAR | Status: AC
  Administered 2019-04-30: 10 mg via INTRAVENOUS
  Filled 2019-04-29: qty 2

## 2019-04-29 NOTE — ED Triage Notes (Signed)
Pt was here last night with same symptoms. Generalized body aches no appetite, vomiting, fevers. Was tested last night for covid and was negative. Nausea with abdominal pain. Had labs last night.

## 2019-04-29 NOTE — ED Notes (Signed)
Pt states he is not getting the treatment that he needs and he will leave AMA and go to be see on [facility]. Pt oriented about AMA process. Pt verbalized understanding.

## 2019-04-29 NOTE — ED Notes (Signed)
Family at bedside. 

## 2019-04-29 NOTE — ED Notes (Signed)
Blake Bradley [phone] pt father, please update on plan of care
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
## 2019-04-29 NOTE — ED Notes (Signed)
Hung fluids and gave motrin at 0131. Pt mother call at 0150 stating that noone had been in pts room to check on him. Entered pts room, after mother called, to check on pt. Pt asking "can I go home"

## 2019-04-30 ENCOUNTER — Emergency Department (HOSPITAL_COMMUNITY): Payer: Medicaid Other

## 2019-04-30 MED ORDER — ONDANSETRON 4 MG PO TBDP: 4.0000 mg | ORAL_TABLET | Freq: Four times a day (QID) | ORAL | 0 refills | Status: DC | PRN

## 2019-04-30 MED ORDER — HALOPERIDOL LACTATE 5 MG/ML IJ SOLN
2.0000 mg | Freq: Once | INTRAMUSCULAR | Status: AC
  Administered 2019-04-30: 2 mg via INTRAVENOUS
  Filled 2019-04-30: qty 1

## 2019-04-30 NOTE — ED Notes (Signed)
Pt continuously calling out to see if he can go home. Family member calling to find out if he can go home.

## 2019-04-30 NOTE — Discharge Instructions (Addendum)
Your symptoms today are suggestive of a virus causing your symptoms.  This could be Epstein-Barr virus or CMV causing another round of mononucleosis.  These tests are not done from the emergency department but can be ordered by a primary care doctor as needed.  Given that these are viral illnesses, you do not need antibiotics.  Your chest x-ray today showed no pneumonia.  Your labs yesterday showed elevation of your liver function tests but they have improved significantly since January 2020.  We recommend that you avoid acetaminophen (Tylenol) and alcohol at this time.  You may use ibuprofen 800 mg every 6 hours as needed for fever and pain.  We recommend that you have your liver function tests rechecked by your primary care provider to ensure that over time they continue to improve.  You do not need admission to the hospital at this time.   Steps to find a Primary Care Provider (PCP):  Call [phone] or [phone] to access "[facility]."  2.  You may also go on the [facility] website at [email or web address]  3.  [facility] also frequently accepts new patients.  [place] [phone]  4.  There are also multiple [facility], [facility] and [facility] practices throughout the [place] that are frequently accepting new patients. You may find a clinic that is close to your home and contact them.  [facility] [email or web address] [phone]   [facility] [email or web address]  [email or web address] [place] [email or web address] [phone]  5.  Local Health Departments also can provide primary care services.  [facility]  [place] [address] [phone]  [facility] [place]  [address] [place] [phone]

## 2019-05-04 LAB — CULTURE, BLOOD (ROUTINE X 2)
Culture: NO GROWTH
Culture: NO GROWTH
Special Requests: ADEQUATE

## 2019-05-30 ENCOUNTER — Telehealth: Payer: Self-pay | Admitting: Internal Medicine

## 2019-05-30 NOTE — Telephone Encounter (Signed)
Patient with bleeding for the last few weeks and rectal discomfort and urgency.  He will come in and see Dr. Carlean Purl on 06/02/19.  He reports he is unable to use OTC products because it " gives me a yeast infection".

## 2019-06-02 ENCOUNTER — Encounter: Payer: Self-pay | Admitting: Internal Medicine

## 2019-06-02 ENCOUNTER — Other Ambulatory Visit (INDEPENDENT_AMBULATORY_CARE_PROVIDER_SITE_OTHER): Payer: Medicaid Other

## 2019-06-02 ENCOUNTER — Ambulatory Visit (INDEPENDENT_AMBULATORY_CARE_PROVIDER_SITE_OTHER): Payer: Medicaid Other | Admitting: Internal Medicine

## 2019-06-02 VITALS — BP 94/68 | HR 64 | Temp 98.2°F | Ht 72.0 in | Wt 180.4 lb

## 2019-06-02 DIAGNOSIS — L309 Dermatitis, unspecified: Secondary | ICD-10-CM

## 2019-06-02 DIAGNOSIS — K648 Other hemorrhoids: Secondary | ICD-10-CM

## 2019-06-02 DIAGNOSIS — R7989 Other specified abnormal findings of blood chemistry: Secondary | ICD-10-CM

## 2019-06-02 DIAGNOSIS — K580 Irritable bowel syndrome with diarrhea: Secondary | ICD-10-CM

## 2019-06-02 DIAGNOSIS — R21 Rash and other nonspecific skin eruption: Secondary | ICD-10-CM

## 2019-06-02 DIAGNOSIS — R945 Abnormal results of liver function studies: Secondary | ICD-10-CM

## 2019-06-02 LAB — CBC WITH DIFFERENTIAL/PLATELET
Basophils Absolute: 0.1 10*3/uL (ref 0.0–0.1)
Basophils Relative: 1 % (ref 0.0–3.0)
Eosinophils Absolute: 0.2 10*3/uL (ref 0.0–0.7)
Eosinophils Relative: 3 % (ref 0.0–5.0)
HCT: 47.1 % (ref 39.0–52.0)
Hemoglobin: 16 g/dL (ref 13.0–17.0)
Lymphocytes Relative: 22.5 % (ref 12.0–46.0)
Lymphs Abs: 1.6 10*3/uL (ref 0.7–4.0)
MCHC: 33.9 g/dL (ref 30.0–36.0)
MCV: 87.8 fl (ref 78.0–100.0)
Monocytes Absolute: 0.4 10*3/uL (ref 0.1–1.0)
Monocytes Relative: 5.7 % (ref 3.0–12.0)
Neutro Abs: 4.8 10*3/uL (ref 1.4–7.7)
Neutrophils Relative %: 67.8 % (ref 43.0–77.0)
Platelets: 206 10*3/uL (ref 150.0–400.0)
RBC: 5.37 Mil/uL (ref 4.22–5.81)
RDW: 13.6 % (ref 11.5–15.5)
WBC: 7.1 10*3/uL (ref 4.0–10.5)

## 2019-06-02 LAB — COMPREHENSIVE METABOLIC PANEL
ALT: 20 U/L (ref 0–53)
AST: 21 U/L (ref 0–37)
Albumin: 5.2 g/dL (ref 3.5–5.2)
Alkaline Phosphatase: 95 U/L (ref 39–117)
BUN: 11 mg/dL (ref 6–23)
CO2: 27 mEq/L (ref 19–32)
Calcium: 9.8 mg/dL (ref 8.4–10.5)
Chloride: 102 mEq/L (ref 96–112)
Creatinine, Ser: 1.09 mg/dL (ref 0.40–1.50)
GFR: 81.86 mL/min (ref >60.00)
Glucose, Bld: 84 mg/dL (ref 70–99)
Potassium: 4 mEq/L (ref 3.5–5.1)
Sodium: 139 mEq/L (ref 135–145)
Total Bilirubin: 1.2 mg/dL (ref 0.2–1.2)
Total Protein: 7.7 g/dL (ref 6.0–8.3)

## 2019-06-02 MED ORDER — HYDROCORTISONE (PERIANAL) 2.5 % EX CREA: 1.0000 "application " | TOPICAL_CREAM | Freq: Two times a day (BID) | CUTANEOUS | 1 refills | Status: DC

## 2019-06-02 MED ORDER — DICYCLOMINE HCL 20 MG PO TABS: 20.0000 mg | ORAL_TABLET | Freq: Three times a day (TID) | ORAL | 1 refills | Status: DC

## 2019-06-02 MED ORDER — NYSTATIN-TRIAMCINOLONE 100000-0.1 UNIT/GM-% EX OINT: 1.0000 "application " | TOPICAL_OINTMENT | Freq: Two times a day (BID) | CUTANEOUS | 0 refills | Status: DC

## 2019-06-02 NOTE — Patient Instructions (Signed)
We have sent the following medications to your pharmacy for you to pick up at your convenience.   Your provider has requested that you go to the basement level for lab work before leaving today. Press "B" on the elevator. The lab is located at the first door on the left as you exit the elevator.   I appreciate the opportunity to care for you. Silvano Rusk, MD, [facility]
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
## 2019-06-03 ENCOUNTER — Telehealth: Payer: Self-pay

## 2019-06-03 MED ORDER — NYSTATIN 100000 UNIT/GM EX CREA: 1.0000 "application " | TOPICAL_CREAM | Freq: Two times a day (BID) | CUTANEOUS | 0 refills | Status: DC

## 2019-06-03 MED ORDER — TRIAMCINOLONE ACETONIDE 0.1 % EX OINT: 1.0000 "application " | TOPICAL_OINTMENT | Freq: Two times a day (BID) | CUTANEOUS | 0 refills | Status: DC

## 2019-06-03 NOTE — Addendum Note (Signed)
Addended by: Marlon Pel on: 06/03/2019 01:50 PM   Modules accepted: Orders

## 2019-06-03 NOTE — Telephone Encounter (Signed)
New rx sent

## 2019-06-03 NOTE — Telephone Encounter (Signed)
Pharmacy sent a fax saying the Mycolog is not covered on patients insurance. They are requesting we split up the ointments and send them in to be cheaper. Please advise Sir, thank you.

## 2019-06-03 NOTE — Telephone Encounter (Signed)
That will be fine  Nystatin 100,000 units/g number 15 g ointment or cream  Triamcinolone 0.1% number 15 g apply both of these twice daily no refills

## 2019-06-04 LAB — HEPATITIS PANEL, ACUTE
Hep A IgM: NONREACTIVE
Hep B C IgM: NONREACTIVE
Hepatitis B Surface Ag: NONREACTIVE
Hepatitis C Ab: NONREACTIVE
SIGNAL TO CUT-OFF: 0.02 (ref <1.00)

## 2019-06-04 LAB — ANA: Anti Nuclear Antibody (ANA): NEGATIVE

## 2019-06-04 LAB — ANTI-SMOOTH MUSCLE ANTIBODY, IGG: Actin (Smooth Muscle) Antibody (IGG): <20 U (ref <20)

## 2019-06-04 LAB — CERULOPLASMIN: Ceruloplasmin: 32 mg/dL (ref 18–36)

## 2019-06-05 LAB — EBV AB TO VIRAL CAPSID AG PNL, IGG+IGM
EBV VCA IgG: 437 U/mL — ABNORMAL HIGH (ref 0.0–17.9)
EBV VCA IgM: 53.4 U/mL — ABNORMAL HIGH (ref 0.0–35.9)

## 2019-06-05 LAB — CMV DNA, QUANTITATIVE, PCR: CMV DNA Quant: NEGATIVE IU/mL

## 2019-06-08 NOTE — Progress Notes (Signed)
Let him know that his liver tests are all back to normal - please get a GI sx update  No current infection though one test pending, and no autoimmune or inherited liver problems turned up  This is good news   He needs a f/u in 4-6 weeks

## 2019-06-09 LAB — EPSTEIN BARR VRS(EBV DNA BY PCR): Epstein-Barr DNA Quant, PCR: NEGATIVE copies/mL

## 2019-06-13 NOTE — Progress Notes (Signed)
My Chart message - no call needed Blake Bradley,  The labs are all ok.  How are you?  Please schedule a follow-up if you have not already.  [name], MD, Marval Regal

## 2019-06-30 ENCOUNTER — Ambulatory Visit: Payer: Medicaid Other | Admitting: Internal Medicine

## 2019-08-14 ENCOUNTER — Emergency Department (HOSPITAL_COMMUNITY)
Admission: EM | Admit: 2019-08-14 | Discharge: 2019-08-14 | Disposition: A | Discharge status: Home / Self Care | Payer: Medicaid Other | Attending: Emergency Medicine | Admitting: Emergency Medicine

## 2019-08-14 ENCOUNTER — Other Ambulatory Visit: Payer: Self-pay

## 2019-08-14 ENCOUNTER — Emergency Department (HOSPITAL_COMMUNITY): Payer: Medicaid Other

## 2019-08-14 DIAGNOSIS — F913 Oppositional defiant disorder: Secondary | ICD-10-CM | POA: Insufficient documentation

## 2019-08-14 DIAGNOSIS — R519 Headache, unspecified: Secondary | ICD-10-CM | POA: Diagnosis present

## 2019-08-14 DIAGNOSIS — F319 Bipolar disorder, unspecified: Secondary | ICD-10-CM | POA: Diagnosis not present

## 2019-08-14 DIAGNOSIS — F909 Attention-deficit hyperactivity disorder, unspecified type: Secondary | ICD-10-CM | POA: Insufficient documentation

## 2019-08-14 DIAGNOSIS — Z79899 Other long term (current) drug therapy: Secondary | ICD-10-CM | POA: Diagnosis not present

## 2019-08-14 MED ORDER — SODIUM CHLORIDE 0.9 % IV BOLUS
1000.0000 mL | Freq: Once | INTRAVENOUS | Status: AC
  Administered 2019-08-14: 21:00:00 1000 mL via INTRAVENOUS

## 2019-08-14 MED ORDER — HYDROMORPHONE HCL 1 MG/ML IJ SOLN
1.0000 mg | Freq: Once | INTRAMUSCULAR | Status: AC
  Administered 2019-08-14: 1 mg via INTRAVENOUS
  Filled 2019-08-14: qty 1

## 2019-08-14 MED ORDER — DIPHENHYDRAMINE HCL 50 MG/ML IJ SOLN
12.5000 mg | Freq: Once | INTRAMUSCULAR | Status: AC
  Administered 2019-08-14: 21:00:00 12.5 mg via INTRAVENOUS
  Filled 2019-08-14: qty 1

## 2019-08-14 MED ORDER — MELOXICAM 7.5 MG PO TABS
7.5000 mg | ORAL_TABLET | Freq: Once | ORAL | Status: AC
  Administered 2019-08-14: 7.5 mg via ORAL
  Filled 2019-08-14 (×2): qty 1

## 2019-08-14 MED ORDER — METOCLOPRAMIDE HCL 5 MG/ML IJ SOLN
10.0000 mg | Freq: Once | INTRAMUSCULAR | Status: AC
  Administered 2019-08-14: 21:00:00 10 mg via INTRAVENOUS
  Filled 2019-08-14: qty 2
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
## 2019-08-14 NOTE — ED Triage Notes (Signed)
Pt brought in by [facility] from home for migraine/HA that started around 1500. Pt denies photosensitivity. Pt endorsed left sided numbness that has since resolved. Pt also c/o nausea, not relieved by 4mg  zofran PTA. Pt A+Ox4, describes HA as "the worst migraine ever".

## 2019-08-14 NOTE — Discharge Instructions (Addendum)
Please get established with a PCP as soon as possible.  Also, please call Dr. Merlene Laughter to get established with their neurology services for further evaluation management of your migraines.  Return to ED or seek medical attention for any uncontrolled nausea and vomiting, new or worsening neurologic symptoms, new or worsening headache, or other new or concerning symptoms.

## 2019-08-14 NOTE — ED Notes (Signed)
Patient verbalizes understanding of discharge instructions. Opportunity for questioning and answers were provided. Armband removed by staff, pt discharged from ED.  

## 2019-08-30 ENCOUNTER — Telehealth: Payer: Self-pay | Admitting: Internal Medicine

## 2019-08-30 ENCOUNTER — Telehealth: Payer: Self-pay | Admitting: Gastroenterology

## 2019-08-30 ENCOUNTER — Other Ambulatory Visit: Payer: Self-pay | Admitting: Internal Medicine

## 2019-08-30 NOTE — Telephone Encounter (Signed)
Please advise Sir, thank you. 

## 2019-08-30 NOTE — Telephone Encounter (Signed)
Patient called on call w/ c/o rectal bleeding and discomfort. No frank pain. Bleeding started in last 24 hours. Does report a "dark spot" near anal canal, but bleeding from opposite site of anal canal. No f/c/n/v, abdominal pain. No improvement with Anusol. No lightheadedness, SOB, CP.  Notes from prior evaluations reviewed.  Hemorrhoids noted on colonoscopy in 2016. Discussed full ddx. Discussed conservative management options for possible hemorrhoidal bleed vs fissure. Hopeful for expedited outpatient appt for evaluation. Alternatively, if ongoing bleeding and concern, could go to ER for expedited evaluation, labs, etc, but given current pandemic-related issues, expressed appropriate caution. He was thankful for phone call.

## 2019-08-31 ENCOUNTER — Other Ambulatory Visit: Payer: Self-pay | Admitting: Internal Medicine

## 2019-08-31 MED ORDER — HYDROCORTISONE (PERIANAL) 2.5 % EX CREA: 1.0000 "application " | TOPICAL_CREAM | Freq: Two times a day (BID) | CUTANEOUS | 1 refills | Status: DC

## 2019-08-31 NOTE — Telephone Encounter (Signed)
Spoke with pt and he is aware. 

## 2019-08-31 NOTE — Telephone Encounter (Signed)
Patients spouse Huston Foley is calling back about below- states that patient is "bleeding from anus" and she wanted sooner appointment with Dr. Carlean Purl and I stated that he was booked out for this year. Offered her PA appointment but she stated that she will just see Dr. Carlean Purl Friday as he offered that day to her when Dr. Carlean Purl called this morning. Told her I would send message to Dr. Carlean Purl to verify.

## 2019-08-31 NOTE — Telephone Encounter (Signed)
Tell him to give it some more time  If I need to work him in I will but give it some time

## 2019-08-31 NOTE — Telephone Encounter (Signed)
I called him and reviewed note from last night  Sounds like a hemorrhoid flare - maybe a thrombosed one +/- fissure  Plan: refilled HC cream bid x 1 week then prn Lidocaine cream OTC Sitz baths Call back prn

## 2019-08-31 NOTE — Telephone Encounter (Signed)
Pt and pts wife calling back. Pt states he has what he thinks is a hemorrhoid and beside that he thinks he has a fissure. States he spoke to our on call md last night and was told if he has a fissure the hem creams may prevent a fissures healing due to the moist area. Pt states he has been bleeding nonstop for the past 14 hours. He is concerned as the bleeding has even soiled his boxers. Please advise.

## 2019-09-05 ENCOUNTER — Telehealth: Payer: Self-pay | Admitting: Internal Medicine

## 2019-09-05 NOTE — Telephone Encounter (Signed)
I was

## 2019-09-05 NOTE — Telephone Encounter (Signed)
Left message for patient to call back  

## 2019-09-05 NOTE — Telephone Encounter (Signed)
I returned the call to the patient's wife.  She does not want the appt tomorrow with Alonza Bogus, PA I encourage her to have him come and be evaluated, she was short, rude and refused any suggestions.. Only will see MD.  I explained that our PAs work very closely with the providers and that if we were to be evaluated in the hospital they would in many cases meet a PA first.  I encouraged her to have him come as this is the only opening this week with any provider.  She adamantly refused. I cancelled the appt at her request.

## 2019-09-05 NOTE — Telephone Encounter (Signed)
I put the patient in for an appt tomorrow am at 8:30 with Alonza Bogus, PA I left a detailed message for the patient to call and confirm.

## 2019-09-05 NOTE — Telephone Encounter (Signed)
Pt called back and said he refuses to see a P.A. "I want to see a doctor and not a PA, especially one that is new. I want to see a doctor when "blood is gushing out of my rectum"  Merleen Nicely [phone]

## 2019-09-05 NOTE — Telephone Encounter (Signed)
I was speaking with the patient and he either hung up or we were cut off.  Attempted to return the call 3 times.  Goes to VM.  I left another message. Patient was stating he has had a large amount of bleeding today and this is very "disturbing".  I asked him did he want the appt for tomorrow and that he needed to be assessed in person.  This is when the call was disconnected.

## 2019-09-05 NOTE — Telephone Encounter (Signed)
Pt's wife called stating that she spoke with three nurses of three different MD's who told her that her husband probably has a tear and they were not sure of why he was prescribed a cream because it is probable making his problem worse. Pt's wife would like a call back.

## 2019-09-05 NOTE — Telephone Encounter (Signed)
Pt's wife Merleen Nicely called to inform that rectal bleeding is getting worse, she is requesting an appt this week. Pls call pt.

## 2019-09-05 NOTE — Telephone Encounter (Signed)
Pt returning your call

## 2019-09-06 ENCOUNTER — Ambulatory Visit: Payer: Medicaid Other | Admitting: Gastroenterology

## 2019-10-19 ENCOUNTER — Encounter: Payer: Self-pay | Admitting: Internal Medicine

## 2019-10-19 ENCOUNTER — Other Ambulatory Visit (INDEPENDENT_AMBULATORY_CARE_PROVIDER_SITE_OTHER): Payer: Medicaid Other

## 2019-10-19 ENCOUNTER — Ambulatory Visit: Payer: Medicaid Other | Admitting: Internal Medicine

## 2019-10-19 VITALS — BP 104/62 | HR 60 | Temp 97.7°F | Ht 74.0 in | Wt 165.5 lb

## 2019-10-19 DIAGNOSIS — K644 Residual hemorrhoidal skin tags: Secondary | ICD-10-CM | POA: Diagnosis not present

## 2019-10-19 DIAGNOSIS — K625 Hemorrhage of anus and rectum: Secondary | ICD-10-CM

## 2019-10-19 DIAGNOSIS — Z1159 Encounter for screening for other viral diseases: Secondary | ICD-10-CM | POA: Diagnosis not present

## 2019-10-19 LAB — CBC WITH DIFFERENTIAL/PLATELET
Basophils Absolute: 0.1 10*3/uL (ref 0.0–0.1)
Basophils Relative: 1.1 % (ref 0.0–3.0)
Eosinophils Absolute: 0.4 10*3/uL (ref 0.0–0.7)
Eosinophils Relative: 6.3 % — ABNORMAL HIGH (ref 0.0–5.0)
HCT: 45.1 % (ref 39.0–52.0)
Hemoglobin: 15.6 g/dL (ref 13.0–17.0)
Lymphocytes Relative: 34.2 % (ref 12.0–46.0)
Lymphs Abs: 2.4 10*3/uL (ref 0.7–4.0)
MCHC: 34.6 g/dL (ref 30.0–36.0)
MCV: 89.1 fl (ref 78.0–100.0)
Monocytes Absolute: 0.7 10*3/uL (ref 0.1–1.0)
Monocytes Relative: 9.5 % (ref 3.0–12.0)
Neutro Abs: 3.5 10*3/uL (ref 1.4–7.7)
Neutrophils Relative %: 48.9 % (ref 43.0–77.0)
Platelets: 215 10*3/uL (ref 150.0–400.0)
RBC: 5.06 Mil/uL (ref 4.22–5.81)
RDW: 12.9 % (ref 11.5–15.5)
WBC: 7.1 10*3/uL (ref 4.0–10.5)
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
## 2019-10-19 NOTE — Patient Instructions (Signed)
You have been scheduled for a colonoscopy. Please follow written instructions given to you at your visit today.  Please pick up your prep supplies at the pharmacy within the next 1-3 days. If you use inhalers (even only as needed), please bring them with you on the day of your procedure.   I appreciate the opportunity to care for you. Carl Gessner, MD, FACG 

## 2019-10-25 ENCOUNTER — Ambulatory Visit (INDEPENDENT_AMBULATORY_CARE_PROVIDER_SITE_OTHER): Payer: Medicaid Other

## 2019-10-25 DIAGNOSIS — Z1159 Encounter for screening for other viral diseases: Secondary | ICD-10-CM

## 2019-10-26 LAB — SARS CORONAVIRUS 2 (TAT 6-24 HRS): SARS Coronavirus 2: NEGATIVE

## 2019-10-27 ENCOUNTER — Other Ambulatory Visit: Payer: Self-pay

## 2019-10-27 ENCOUNTER — Encounter: Payer: Self-pay | Admitting: Internal Medicine

## 2019-10-27 ENCOUNTER — Ambulatory Visit (AMBULATORY_SURGERY_CENTER): Payer: Medicaid Other | Admitting: Internal Medicine

## 2019-10-27 VITALS — BP 112/67 | HR 69 | Temp 98.5°F | Resp 13 | Ht 74.0 in | Wt 165.0 lb

## 2019-10-27 DIAGNOSIS — K625 Hemorrhage of anus and rectum: Secondary | ICD-10-CM

## 2019-10-27 MED ORDER — SODIUM CHLORIDE 0.9 % IV SOLN: 500.0000 mL | INTRAVENOUS | Status: DC

## 2019-10-27 MED ORDER — HYDROCORTISONE ACETATE 25 MG RE SUPP: 25.0000 mg | Freq: Every evening | RECTAL | 0 refills | Status: DC | PRN

## 2019-10-27 NOTE — Progress Notes (Signed)
PT taken to PACU. Monitors in place. VSS. Report given to RN. 

## 2019-10-27 NOTE — Patient Instructions (Addendum)
The bleeding is from the hemorrhoids.  I have prescribed some hydrocortisone suppositories to use.  If the problems persist and are bothersome can have you see a surgeon about possible surgical removal though we usually try to avoid that.  There is nothing bad going on.  I appreciate the opportunity to care for you. Iva Boop, MD, FACG YOU HAD AN ENDOSCOPIC PROCEDURE TODAY AT THE [facility]:   Refer to the procedure report that was given to you for any specific questions about what was found during the examination.  If the procedure report does not answer your questions, please call your gastroenterologist to clarify.  If you requested that your care partner not be given the details of your procedure findings, then the procedure report has been included in a sealed envelope for you to review at your convenience later.  YOU SHOULD EXPECT: Some feelings of bloating in the abdomen. Passage of more gas than usual.  Walking can help get rid of the air that was put into your GI tract during the procedure and reduce the bloating. If you had a lower endoscopy (such as a colonoscopy or flexible sigmoidoscopy) you may notice spotting of blood in your stool or on the toilet paper. If you underwent a bowel prep for your procedure, you may not have a normal bowel movement for a few days.  Please Note:  You might notice some irritation and congestion in your nose or some drainage.  This is from the oxygen used during your procedure.  There is no need for concern and it should clear up in a day or so.  SYMPTOMS TO REPORT IMMEDIATELY:   Following lower endoscopy (colonoscopy or flexible sigmoidoscopy):  Excessive amounts of blood in the stool  Significant tenderness or worsening of abdominal pains  Swelling of the abdomen that is new, acute  Fever of 100F or higher   For urgent or emergent issues, a gastroenterologist can be reached at any hour by calling [phone].   DIET:  We  do recommend a small meal at first, but then you may proceed to your regular diet.  Drink plenty of fluids but you should avoid alcoholic beverages for 24 hours.  MEDICATIONS: Continue present medications. Use Hydrocortisone suppositories as needed.  Follow Up: If problems persist, will consider a surgical consult.  Please see handouts given to you by your recovery nurse.  ACTIVITY:  You should plan to take it easy for the rest of today and you should NOT DRIVE or use heavy machinery until tomorrow (because of the sedation medicines used during the test).    FOLLOW UP: Our staff will call the number listed on your records 48-72 hours following your procedure to check on you and address any questions or concerns that you may have regarding the information given to you following your procedure. If we do not reach you, we will leave a message.  We will attempt to reach you two times.  During this call, we will ask if you have developed any symptoms of COVID 19. If you develop any symptoms (ie: fever, flu-like symptoms, shortness of breath, cough etc.) before then, please call [phone].  If you test positive for Covid 19 in the 2 weeks post procedure, please call and report this information to [place].    If any biopsies were taken you will be contacted by phone or by letter within the next 1-3 weeks.  Please call us at [phone] if you have not heard  about the biopsies in 3 weeks.   Thank you for allowing [place] to provide for your healthcare needs today.   SIGNATURES/CONFIDENTIALITY: You and/or your care partner have signed paperwork which will be entered into your electronic medical record.  These signatures attest to the fact that that the information above on your After Visit Summary has been reviewed and is understood.  Full responsibility of the confidentiality of this discharge information lies with you and/or your care-partner.

## 2019-10-27 NOTE — Progress Notes (Signed)
Temp LC V/s  cw 

## 2019-10-27 NOTE — Op Note (Signed)
[facility] Patient Name: Blake Bradley Procedure Date: 10/27/2019 4:24 PM MRN: [id number] Endoscopist: Iva Boop , MD Age: 29 Referring MD:  Date of Birth: [birth year] Gender: Male Account #: [id number] Procedure:                Colonoscopy Indications:              Rectal bleeding Medicines:                Propofol per Anesthesia, Monitored Anesthesia Care Procedure:                Pre-Anesthesia Assessment:                           - Prior to the procedure, a History and Physical                            was performed, and patient medications and                            allergies were reviewed. The patient's tolerance of                            previous anesthesia was also reviewed. The risks                            and benefits of the procedure and the sedation                            options and risks were discussed with the patient.                            All questions were answered, and informed consent                            was obtained. Prior Anticoagulants: The patient has                            taken no previous anticoagulant or antiplatelet                            agents. ASA Grade Assessment: II - A patient with                            mild systemic disease. After reviewing the risks                            and benefits, the patient was deemed in                            satisfactory condition to undergo the procedure.                           After obtaining informed consent, the colonoscope  was passed under direct vision. Throughout the                            procedure, the patient's blood pressure, pulse, and                            oxygen saturations were monitored continuously. The                            Colonoscope was introduced through the anus and                            advanced to the the cecum, identified by                            appendiceal orifice and  ileocecal valve. The                            colonoscopy was performed without difficulty. The                            patient tolerated the procedure well. The quality                            of the bowel preparation was good. The bowel                            preparation used was Miralax via split dose                            instruction. The ileocecal valve, appendiceal                            orifice, and rectum were photographed. Scope In: 4:51:43 PM Scope Out: 5:00:32 PM Scope Withdrawal Time: 0 hours 5 minutes 35 seconds  Total Procedure Duration: 0 hours 8 minutes 49 seconds  Findings:                 The perianal and digital rectal examinations were                            normal. Pertinent negatives include normal prostate                            (size, shape, and consistency).                           External and internal hemorrhoids were found.                            Estimated blood loss: none.                           The exam was otherwise without abnormality on  direct and retroflexion views. Complications:            No immediate complications. Estimated Blood Loss:     Estimated blood loss: none. Impression:               - External and internal hemorrhoids. Mostly external                           - The examination was otherwise normal on direct                            and retroflexion views.                           - No specimens collected. Recommendation:           - Patient has a contact number available for                            emergencies. The signs and symptoms of potential                            delayed complications were discussed with the                            patient. Return to normal activities tomorrow.                            Written discharge instructions were provided to the                            patient.                           - Resume previous diet.                            - Continue present medications.                           - No recommendation at this time regarding repeat                            colonoscopy due to young age.                           - hydrocortisone suppositories prn                           If persisten problems surgical consult [name], MD 10/27/2019 5:18:57 PM This report has been signed electronically.

## 2019-10-28 ENCOUNTER — Telehealth: Payer: Self-pay | Admitting: Internal Medicine

## 2019-10-28 ENCOUNTER — Telehealth: Payer: Self-pay

## 2019-10-28 MED ORDER — HYDROCORTISONE (PERIANAL) 2.5 % EX CREA: TOPICAL_CREAM | CUTANEOUS | 1 refills | Status: DC

## 2019-10-28 MED ORDER — HYDROCORTISONE (PERIANAL) 2.5 % EX CREA: 1.0000 "application " | TOPICAL_CREAM | Freq: Every day | CUTANEOUS | 1 refills | Status: DC

## 2019-10-28 NOTE — Telephone Encounter (Signed)
Change to one of the 2.5% creams 30 g  Use nightly x 2 weeks then prn 1 refill

## 2019-10-28 NOTE — Telephone Encounter (Signed)
Patient's father has questions in reference to duration of procedure done yesterday.

## 2019-10-28 NOTE — Telephone Encounter (Signed)
I tried to do a prior authorization for patient's Hydrocortisone Acetate 25 mg suppositories. Per [place] tracks phone # [phone] there are no suppositories covered. They cover several rectal creams: Proctopak 1% cream, anusol HC 2.5% cream, Proctozol 2.5% cream, Proctozone HC 2.5% cream. The cash price for the suppositories that were sent in is $470.00 and with GoodRx it is $127.57. Please advise Sir, thank you.

## 2019-10-28 NOTE — Telephone Encounter (Signed)
I have sent in proctozone HC 2.5% cream as directed. Pharmacy informed.

## 2019-10-28 NOTE — Telephone Encounter (Signed)
Left message for a call back.

## 2019-10-31 ENCOUNTER — Telehealth: Payer: Self-pay

## 2019-10-31 ENCOUNTER — Telehealth: Payer: Self-pay | Admitting: *Deleted

## 2019-10-31 NOTE — Telephone Encounter (Signed)
Left message on follow up call. 

## 2019-10-31 NOTE — Telephone Encounter (Signed)
Left message on f/u call 

## 2019-12-04 ENCOUNTER — Other Ambulatory Visit: Payer: Self-pay | Admitting: Internal Medicine

## 2020-01-24 ENCOUNTER — Telehealth: Payer: Self-pay | Admitting: Internal Medicine

## 2020-01-24 NOTE — Telephone Encounter (Signed)
Pt stated that Dr. Leone Payor had recommended alprazolam or klonopin for anxiety.  Pt stated that his psychiatrist is not working with him.  Pt is in the process of finding another psychiatrist.  Pt requested that Dr. Leone Payor prescribe those medications in the meantime.

## 2020-01-24 NOTE — Telephone Encounter (Signed)
I will not Rx this for him

## 2020-01-24 NOTE — Telephone Encounter (Signed)
Are you willing to prescribe?

## 2020-01-24 NOTE — Telephone Encounter (Signed)
Left message for patient to call back  

## 2020-01-26 NOTE — Telephone Encounter (Signed)
I left a message a for the patient with Dr. Leone Payor response

## 2020-02-14 ENCOUNTER — Telehealth: Payer: Self-pay | Admitting: Internal Medicine

## 2020-02-14 MED ORDER — DICYCLOMINE HCL 20 MG PO TABS: ORAL_TABLET | ORAL | 1 refills | Status: DC

## 2020-02-14 NOTE — Telephone Encounter (Signed)
Please advise Sir, thank you. 

## 2020-02-14 NOTE — Telephone Encounter (Signed)
Patient informed and dicyclomine refilled and appointment made to discuss meds.

## 2020-02-14 NOTE — Telephone Encounter (Signed)
Patient called requesting refill on Bentyl and also requesting to have a higher dosage.

## 2020-02-14 NOTE — Telephone Encounter (Signed)
Ok to refill x 2   There is not a higher dosage than 20 mg  He can have a f/u visit to sort this out further - next available

## 2020-03-13 DIAGNOSIS — F419 Anxiety disorder, unspecified: Secondary | ICD-10-CM | POA: Insufficient documentation

## 2020-03-15 ENCOUNTER — Ambulatory Visit (INDEPENDENT_AMBULATORY_CARE_PROVIDER_SITE_OTHER): Payer: Medicaid Other | Admitting: Internal Medicine

## 2020-03-15 ENCOUNTER — Encounter: Payer: Self-pay | Admitting: Internal Medicine

## 2020-03-15 ENCOUNTER — Other Ambulatory Visit (INDEPENDENT_AMBULATORY_CARE_PROVIDER_SITE_OTHER): Payer: Medicaid Other

## 2020-03-15 VITALS — BP 100/70 | HR 56 | Ht 74.0 in | Wt 166.4 lb

## 2020-03-15 DIAGNOSIS — K648 Other hemorrhoids: Secondary | ICD-10-CM | POA: Diagnosis not present

## 2020-03-15 DIAGNOSIS — K582 Mixed irritable bowel syndrome: Secondary | ICD-10-CM | POA: Diagnosis not present

## 2020-03-15 DIAGNOSIS — R195 Other fecal abnormalities: Secondary | ICD-10-CM

## 2020-03-15 DIAGNOSIS — R634 Abnormal weight loss: Secondary | ICD-10-CM

## 2020-03-15 DIAGNOSIS — F411 Generalized anxiety disorder: Secondary | ICD-10-CM

## 2020-03-15 LAB — IGA: IgA: 86 mg/dL (ref 68–378)
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
## 2020-03-15 NOTE — Patient Instructions (Signed)
Your provider has requested that you go to the basement level for lab work before leaving today. Press "B" on the elevator. The lab is located at the first door on the left as you exit the elevator.  If you are age 27 or older, your body mass index should be between 23-30. Your Body mass index is 21.36 kg/m. If this is out of the aforementioned range listed, please consider follow up with your Primary Care Provider.  If you are age 30 or younger, your body mass index should be between 19-25. Your Body mass index is 21.36 kg/m. If this is out of the aformentioned range listed, please consider follow up with your Primary Care Provider.   Due to recent changes in healthcare laws, you may see the results of your imaging and laboratory studies on MyChart before your provider has had a chance to review them.  We understand that in some cases there may be results that are confusing or concerning to you. Not all laboratory results come back in the same time frame and the provider may be waiting for multiple results in order to interpret others.  Please give [place] 48 hours in order for your provider to thoroughly review all the results before contacting the office for clarification of your results.   Thank you for choosing me and [facility].  Iva Boop, M.D., [facility]

## 2020-03-16 ENCOUNTER — Other Ambulatory Visit: Payer: Medicaid Other

## 2020-03-16 DIAGNOSIS — R195 Other fecal abnormalities: Secondary | ICD-10-CM

## 2020-03-16 DIAGNOSIS — K648 Other hemorrhoids: Secondary | ICD-10-CM

## 2020-03-16 DIAGNOSIS — K582 Mixed irritable bowel syndrome: Secondary | ICD-10-CM

## 2020-03-16 DIAGNOSIS — R634 Abnormal weight loss: Secondary | ICD-10-CM

## 2020-03-16 LAB — TISSUE TRANSGLUTAMINASE ABS,IGG,IGA
(tTG) Ab, IgA: 1 U/mL
(tTG) Ab, IgG: 1 U/mL

## 2020-03-19 LAB — FECAL FAT, QUALITATIVE
Fat Qual Neutral, Stl: NORMAL
Fat Qual Total, Stl: NORMAL

## 2020-03-23 LAB — PANCREATIC ELASTASE, FECAL: Pancreatic Elastase-1, Stool: >500 mcg/g

## 2020-05-04 ENCOUNTER — Other Ambulatory Visit: Payer: Self-pay | Admitting: Internal Medicine

## 2020-05-11 ENCOUNTER — Other Ambulatory Visit: Payer: Self-pay | Admitting: Internal Medicine

## 2020-06-07 ENCOUNTER — Telehealth: Payer: Self-pay | Admitting: Internal Medicine

## 2020-06-07 ENCOUNTER — Encounter (HOSPITAL_COMMUNITY): Payer: Self-pay | Admitting: *Deleted

## 2020-06-07 ENCOUNTER — Other Ambulatory Visit: Payer: Self-pay

## 2020-06-07 ENCOUNTER — Emergency Department (HOSPITAL_COMMUNITY)
Admission: EM | Admit: 2020-06-07 | Discharge: 2020-06-08 | Disposition: A | Discharge status: Home / Self Care | Payer: Medicaid Other | Attending: Emergency Medicine | Admitting: Emergency Medicine

## 2020-06-07 DIAGNOSIS — K625 Hemorrhage of anus and rectum: Secondary | ICD-10-CM | POA: Diagnosis present

## 2020-06-07 DIAGNOSIS — Z79899 Other long term (current) drug therapy: Secondary | ICD-10-CM | POA: Insufficient documentation

## 2020-06-07 LAB — CBC
HCT: 45.7 % (ref 39.0–52.0)
Hemoglobin: 15.4 g/dL (ref 13.0–17.0)
MCH: 29.8 pg (ref 26.0–34.0)
MCHC: 33.7 g/dL (ref 30.0–36.0)
MCV: 88.4 fL (ref 80.0–100.0)
Platelets: 240 10*3/uL (ref 150–400)
RBC: 5.17 MIL/uL (ref 4.22–5.81)
RDW: 12.2 % (ref 11.5–15.5)
WBC: 7.2 10*3/uL (ref 4.0–10.5)
nRBC: 0 % (ref 0.0–0.2)

## 2020-06-07 LAB — COMPREHENSIVE METABOLIC PANEL
ALT: 24 U/L (ref 0–44)
AST: 22 U/L (ref 15–41)
Albumin: 4.7 g/dL (ref 3.5–5.0)
Alkaline Phosphatase: 67 U/L (ref 38–126)
Anion gap: 10 (ref 5–15)
BUN: 12 mg/dL (ref 6–20)
CO2: 26 mmol/L (ref 22–32)
Calcium: 9.1 mg/dL (ref 8.9–10.3)
Chloride: 104 mmol/L (ref 98–111)
Creatinine, Ser: 1.02 mg/dL (ref 0.61–1.24)
GFR calc Af Amer: >60 mL/min (ref >60)
GFR calc non Af Amer: >60 mL/min (ref >60)
Glucose, Bld: 81 mg/dL (ref 70–99)
Potassium: 3.4 mmol/L — ABNORMAL LOW (ref 3.5–5.1)
Sodium: 140 mmol/L (ref 135–145)
Total Bilirubin: 1.1 mg/dL (ref 0.3–1.2)
Total Protein: 6.9 g/dL (ref 6.5–8.1)

## 2020-06-07 LAB — TYPE AND SCREEN
ABO/RH(D): A POS
Antibody Screen: NEGATIVE

## 2020-06-07 LAB — ABO/RH: ABO/RH(D): A POS

## 2020-06-07 NOTE — ED Triage Notes (Signed)
Pt began having rectal bleeding yesterday.  Pt has hx of IBS and sees GI for this.  Pt states that he was needing to go to the bathroom and was passing bright red blood.  He also states that he woke up and found blood in his bed.  Pt states that the bleeding is ongoing but not to where he is needing a pad.  Pt states that he has hemorrhoids (internal and external)

## 2020-06-07 NOTE — Telephone Encounter (Signed)
Pt's mother is requesting a call back from a nurse ASAP, caller states the pt is experiencing severe blood through the rectum and needs to be seen asap.  Nothing available soon

## 2020-06-07 NOTE — Telephone Encounter (Signed)
Pt's mother is requesting a call back sometime soon.

## 2020-06-07 NOTE — Telephone Encounter (Signed)
Patient reports that he is passing 2 days worth of bright red blood.  Blood does not stop and he is passing clots.  Patient is advised he will need to go to the ED for eval now.  He verbalized understanding.

## 2020-06-07 NOTE — ED Notes (Signed)
Pt called for triage no response x1 ?

## 2020-06-07 NOTE — Telephone Encounter (Signed)
Left message for patient to call back  
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
## 2020-06-14 ENCOUNTER — Telehealth: Payer: Self-pay | Admitting: Internal Medicine

## 2020-06-14 NOTE — Telephone Encounter (Signed)
Patient's mother notified of the appt dates and times.  

## 2020-06-14 NOTE — Telephone Encounter (Signed)
Please see ED notes from 06/07/20.  Is he a banding candidate?  Do you want to work him in?

## 2020-06-14 NOTE — Telephone Encounter (Signed)
I will consider banding  I am willing to see him 9/13 at 350 if that works for him  He can be reassured that though the bleeding looks heavy his blood count was ok

## 2020-06-14 NOTE — Telephone Encounter (Signed)
Patients mother called states they went to the ER and they recommended he see  Dr. Leone Payor fo rhe is still heavily bleeding.

## 2020-06-15 NOTE — Telephone Encounter (Signed)
Patients mother called back to advise that the hemorrhoid is not internal its external.

## 2020-06-20 NOTE — Telephone Encounter (Signed)
Patient's mother notified that will need to cancel his upcoming appt due to Dr. Leone Payor being out of the office.  He is offered an appt with APP.  He will come in on 9/16

## 2020-06-25 ENCOUNTER — Encounter: Payer: Medicaid Other | Admitting: Internal Medicine

## 2020-06-27 ENCOUNTER — Telehealth: Payer: Self-pay | Admitting: Internal Medicine

## 2020-06-27 MED ORDER — DICYCLOMINE HCL 20 MG PO TABS: ORAL_TABLET | ORAL | 0 refills | Status: DC

## 2020-06-27 NOTE — Telephone Encounter (Signed)
Bentyl refilled as requested. MyCharted him a message.

## 2020-06-28 ENCOUNTER — Ambulatory Visit (INDEPENDENT_AMBULATORY_CARE_PROVIDER_SITE_OTHER): Payer: Medicaid Other | Admitting: Physician Assistant

## 2020-06-28 ENCOUNTER — Encounter: Payer: Self-pay | Admitting: Physician Assistant

## 2020-06-28 VITALS — BP 114/72 | HR 68 | Ht 72.0 in | Wt 164.1 lb

## 2020-06-28 DIAGNOSIS — K648 Other hemorrhoids: Secondary | ICD-10-CM | POA: Diagnosis not present

## 2020-06-28 DIAGNOSIS — K580 Irritable bowel syndrome with diarrhea: Secondary | ICD-10-CM

## 2020-06-28 MED ORDER — HYDROCORTISONE (PERIANAL) 2.5 % EX CREA: TOPICAL_CREAM | CUTANEOUS | 0 refills | Status: DC

## 2020-06-28 MED ORDER — DICYCLOMINE HCL 20 MG PO TABS
ORAL_TABLET | ORAL | 0 refills | Status: DC
Start: 2020-06-28 — End: 2021-02-15

## 2020-06-28 MED ORDER — HYDROCORTISONE ACETATE 25 MG RE SUPP: 25.0000 mg | Freq: Every evening | RECTAL | 4 refills | Status: DC | PRN

## 2020-06-28 NOTE — Patient Instructions (Addendum)
If you are age 27 or older, your body mass index should be between 23-30. Your Body mass index is 22.26 kg/m. If this is out of the aforementioned range listed, please consider follow up with your Primary Care Provider.  If you are age 27 or younger, your body mass index should be between 19-25. Your Body mass index is 22.26 kg/m. If this is out of the aformentioned range listed, please consider follow up with your Primary Care Provider.   You have been given a testing kit to check for small intestine bacterial overgrowth (SIBO) which is completed by a company named [facility]. Make sure to return your test in the mail using the return mailing label given to you along with the kit. Your demographic and insurance information have already been sent to the company and they should be in contact with you over the next week regarding this test. [facility] will collect an upfront charge of $99.74 for commercial insurance plans and $209.74 is you are paying cash. Make sure to discuss with [facility] PRIOR to having the test if they have gotten informatoin from your insurance company as to how much your testing will cost out of pocket, if any. Please keep in mind that you will be getting a call from phone number [phone] or a similar number. If you do not hear from them within this time frame, please call our office at [phone].   Continue Bentyl 20 mg four times a day Continue the procto-med  START Hydrocortisone Suppositories 1 at bedtime as needed.  You may try to start using Imodium OTC 1-2 at the onset of diarrhea, and 1 every 6 hours as need.  You have been scheduled for a hemorrhoid banding scheduled for 07/03/20 at 4:00 pm with Dr. Havery Moros. Please arrive 15 minutes early.

## 2020-06-28 NOTE — Progress Notes (Signed)
Subjective:    Patient ID: Blake Bradley, male    DOB: December 19, 1990, 29 y.o.   MRN: [id number]  HPI Blake Bradley is a pleasant 29 year old white male, established with Dr. Leone Payor.  He has history of IBS-D, generalized anxiety disorder, bipolar disorder, ADD, prior episode of acute hepatitis and is status post appendectomy 2015. He comes in today with concerns regarding ongoing rectal bleeding and somewhat progressive IBS symptoms. He had colonoscopy in January 2021 - with the exception of external and internal hemorrhoids. He has been struggling over the past several months with recurring episodes of rectal bleeding, he just had an episode that lasted about 4 weeks.  He says he has bleeding on a daily basis with these episodes and more recently has also been having some oozing of blood in between bowel movements requiring him to wear a pad at times. No current complaint of rectal pain. Regarding his IBS symptoms he says he only eats once a day because he always has urgency postprandially.  He is avoiding wheat because he feels better off wheat and is on a very bland diet consisting of a lot of rice and chicken.  He has been taking dicyclomine 4 times daily but continues to have episodes of lower abdominal cramping and "attacks" with urgent diarrhea that may last up to 8 hours.  He feels that he has episodes of urgency and diarrhea a couple of times each week. Also with frequent bloating and gas. June 2021 celiac markers negative, fecal fat within normal limits, fecal elastase within normal limits   Review of Systems Pertinent positive and negative review of systems were noted in the above HPI section.  All other review of systems was otherwise negative.  Outpatient Encounter Medications as of 06/28/2020  Medication Sig   dicyclomine (BENTYL) 20 MG tablet TAKE 1 TABLET BY MOUTH 4 TIMES DAILY BEFORE MEAL(S) AND AT BEDTIME   hydrocortisone (PROCTO-MED HC) 2.5 % rectal cream APPLY PEA SIZE AMOUNT TO  RECTUM NIGHTLY FOR 2 WEEKS AND THEN AS NEEDED   [DISCONTINUED] dicyclomine (BENTYL) 20 MG tablet TAKE 1 TABLET BY MOUTH 4 TIMES DAILY BEFORE MEAL(S) AND AT BEDTIME   [DISCONTINUED] PROCTO-MED HC 2.5 % rectal cream APPLY PEA SIZE AMOUNT TO RECTUM NIGHTLY FOR 2 WEEKS AND THEN AS NEEDED   hydrocortisone (ANUSOL-HC) 25 MG suppository Place 1 suppository (25 mg total) rectally at bedtime as needed for hemorrhoids or anal itching.   [DISCONTINUED] albuterol (VENTOLIN HFA) 108 (90 Base) MCG/ACT inhaler Inhale into the lungs as needed.   [DISCONTINUED] dicyclomine (BENTYL) 20 MG tablet TAKE 1 TABLET BY MOUTH 4 TIMES DAILY BEFORE MEAL(S) AND AT BEDTIME   [DISCONTINUED] methylPREDNISolone (MEDROL DOSEPAK) 4 MG TBPK tablet as directed.   [DISCONTINUED] Triamcinolone Acetonide (NASACORT AQ NA) Place into the nose daily.   [DISCONTINUED] venlafaxine XR (EFFEXOR-XR) 37.5 MG 24 hr capsule Take 1 capsule by mouth daily.   No facility-administered encounter medications on file as of 06/28/2020.   Allergies  Allergen Reactions   Codeine Anaphylaxis   Ketorolac Rash    Possible rash, itching. Occurred with other meds. Tolerates ibuprofen   Morphine And Related Hives   Prednisone Hives   Ibuprofen Other (See Comments)    Told to stay away from by provider    Tylenol [Acetaminophen] Other (See Comments)    Told to stay away from by provider    Patient Active Problem List   Diagnosis Date Noted   CMV infection, acute (HCC) 10/22/2018   Acute hepatitis 10/18/2018  Post concussion syndrome 08/13/2017   Headache 06/11/2017   Generalized anxiety disorder 12/19/2016   IBS (irritable bowel syndrome) 03/29/2015   Hemorrhoids, internal, with bleeding 03/29/2015   Uninsured 03/27/2015   Bipolar disorder, unspecified (HCC) 11/08/2013   Allergy    ADD (attention deficit disorder)    Social History   Socioeconomic History   Marital status: Single    Spouse name: Not on file    Number of children: Not on file   Years of education: Not on file   Highest education level: Not on file  Occupational History   Not on file  Tobacco Use   Smoking status: Never Smoker   Smokeless tobacco: Never Used  Substance and Sexual Activity   Alcohol use: Yes    Comment: ocassionally   Drug use: No   Sexual activity: Not on file  Other Topics Concern   Not on file  Social History Narrative   Single - girlfriend, live together they have a son born 76 61-20   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   No Etoh, drugs, tobacco   Social Determinants of Corporate investment banker Strain:    Difficulty of Paying Living Expenses: Not on file  Food Insecurity:    Worried About Programme researcher, broadcasting/film/video in the Last Year: Not on file   [employer] of Food in the Last Year: Not on file  Transportation Needs:    Lack of Transportation (Medical): Not on file   Lack of Transportation (Non-Medical): Not on file  Physical Activity:    Days of Exercise per Week: Not on file   Minutes of Exercise per Session: Not on file  Stress:    Feeling of Stress : Not on file  Social Connections:    Frequency of Communication with Friends and Family: Not on file   Frequency of Social Gatherings with Friends and Family: Not on file   Attends Religious Services: Not on file   Active Member of Clubs or Organizations: Not on file   Attends Banker Meetings: Not on file   Marital Status: Not on file  Intimate Partner Violence:    Fear of Current or Ex-Partner: Not on file   Emotionally Abused: Not on file   Physically Abused: Not on file   Sexually Abused: Not on file    Mr. Castile family history includes Breast cancer in his mother; Colon cancer in his maternal uncle; Ulcerative colitis in his mother.      Objective:    Vitals:   06/28/20 0856  BP: 114/72  Pulse: 68    Physical Exam Well-developed well-nourished young white/male  in no acute distress.  Height, Weight, 164 BMI 22.2  HEENT; nontraumatic normocephalic, EOMI, PE R R LA, sclera anicteric. Oropharynx; not examined Neck; supple, no JVD Cardiovascular; regular rate and rhythm with S1-S2, no murmur rub or gallop Pulmonary; Clear bilaterally Abdomen; soft, nontender, nondistended, no palpable mass or hepatosplenomegaly, bowel sounds are active Rectal; no external hemorrhoids noted, on anoscopy 2 prominent internal hemorrhoids, 1 edematous and inflamed Skin; benign exam, no jaundice rash or appreciable lesions Extremities; no clubbing cyanosis or edema skin warm and dry Neuro/Psych; alert and oriented x4, grossly nonfocal mood and affect appropriate       Assessment & Plan:   #75 29 year old male with previously documented internal and external hemorrhoids who on exam today has prominent internal hemorrhoids, and no evident external hemorrhoids.  Patient with persistent  prolonged episodes of rectal bleeding over the past several months at times requiring him to wear a pad. Bleeding is consistent with internal hemorrhoidal etiology  #2 IBS-D.  Patient continues to have episodes of diarrhea and abdominal cramping despite regular use of dicyclomine, and dietary alteration.  Also with frequent bloating and gas. Rule out component of SIBO. 3.  Status post appendectomy 4.  History of acute CMV hepatitis 5.  Generalized anxiety disorder and bipolar disorder, ADD 6.  Status post appendectomy  Plan; start Anusol HC suppositories nightly for 5 to 7 days as needed for episodes of hemorrhoidal bleeding. Continue dicyclomine 20 mg p.o. 4 times daily Advised patient to try Imodium OTC at onset of more acute episodes of urgency and diarrhea, take 1-2 Imodium may repeat every 6 hours as needed Continue gluten avoidance Proceed with breath test/SIBO Patient will be scheduled for in office hemorrhoidal banding with Dr. Adela Lank (in Dr. Marvell Fuller absence).  Hemorrhoidal  banding was discussed in detail with the patient including indications risks and benefits and he is agreeable to proceed.  Depending on results of breath testing, and response to Imodium, consider Viberzi, and further abdominal imaging.  No evidence of IBD by prior CT in 2018  Sammuel Cooper PA-C 06/28/2020   Cc: Malka So., MD   Agree with plans. Insurance rx coverage may limit tx.  Iva Boop, MD, [facility]

## 2020-07-03 ENCOUNTER — Encounter: Payer: Medicaid Other | Admitting: Gastroenterology

## 2020-07-31 ENCOUNTER — Ambulatory Visit: Payer: Medicaid Other | Admitting: Internal Medicine

## 2020-08-21 ENCOUNTER — Encounter: Payer: Medicaid Other | Admitting: Gastroenterology

## 2020-08-29 ENCOUNTER — Ambulatory Visit (INDEPENDENT_AMBULATORY_CARE_PROVIDER_SITE_OTHER): Payer: Medicaid Other | Admitting: Internal Medicine

## 2020-08-29 ENCOUNTER — Encounter: Payer: Self-pay | Admitting: Internal Medicine

## 2020-08-29 VITALS — BP 100/60 | HR 64 | Ht 73.0 in | Wt 166.0 lb

## 2020-08-29 DIAGNOSIS — K624 Stenosis of anus and rectum: Secondary | ICD-10-CM

## 2020-08-29 DIAGNOSIS — K648 Other hemorrhoids: Secondary | ICD-10-CM

## 2020-08-29 DIAGNOSIS — K582 Mixed irritable bowel syndrome: Secondary | ICD-10-CM

## 2020-08-29 DIAGNOSIS — K644 Residual hemorrhoidal skin tags: Secondary | ICD-10-CM | POA: Diagnosis not present

## 2020-08-29 MED ORDER — AMBULATORY NON FORMULARY MEDICATION: 1 refills | Status: DC

## 2020-08-29 NOTE — Patient Instructions (Addendum)
Continue with your current medicines.  Your provider has prescribed Diltiazem 2%  for you. Please follow the directions written on your prescription bottle or given to you specifically by your provider. Since this is a specialty medication and is not readily available at most local pharmacies, we have sent your prescription to:  [facility] information is below: Address: [address] [place], [place] [address]  [phone]  *Please DO NOT go directly from our office to pick up this medication! Give the pharmacy 1 day to process the prescription as this is compounded and takes time to make.   Please follow up with Dr Leone Payor in 2 months.   I appreciate the opportunity to care for you. Stan Head, MD, [facility]
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
## 2020-09-12 DIAGNOSIS — U071 COVID-19: Secondary | ICD-10-CM

## 2020-09-12 HISTORY — DX: COVID-19: U07.1

## 2020-10-25 ENCOUNTER — Other Ambulatory Visit: Payer: Self-pay | Admitting: Internal Medicine

## 2020-12-11 ENCOUNTER — Ambulatory Visit: Payer: Medicaid Other | Admitting: Gastroenterology

## 2020-12-14 ENCOUNTER — Other Ambulatory Visit: Payer: Self-pay | Admitting: Physician Assistant

## 2020-12-26 ENCOUNTER — Other Ambulatory Visit: Payer: Self-pay | Admitting: Internal Medicine

## 2021-01-04 ENCOUNTER — Other Ambulatory Visit: Payer: Self-pay | Admitting: Physician Assistant

## 2021-01-09 ENCOUNTER — Ambulatory Visit: Payer: Medicaid Other | Admitting: Internal Medicine

## 2021-01-24 ENCOUNTER — Other Ambulatory Visit: Payer: Self-pay | Admitting: Physician Assistant

## 2021-02-13 ENCOUNTER — Other Ambulatory Visit: Payer: Self-pay

## 2021-02-14 ENCOUNTER — Ambulatory Visit (INDEPENDENT_AMBULATORY_CARE_PROVIDER_SITE_OTHER): Payer: Medicaid Other | Admitting: Internal Medicine

## 2021-02-14 ENCOUNTER — Encounter: Payer: Self-pay | Admitting: Internal Medicine

## 2021-02-14 VITALS — BP 108/60 | HR 68 | Ht 72.0 in | Wt 172.0 lb

## 2021-02-14 DIAGNOSIS — F40298 Other specified phobia: Secondary | ICD-10-CM

## 2021-02-14 DIAGNOSIS — F418 Other specified anxiety disorders: Secondary | ICD-10-CM | POA: Diagnosis not present

## 2021-02-14 DIAGNOSIS — K644 Residual hemorrhoidal skin tags: Secondary | ICD-10-CM | POA: Diagnosis not present

## 2021-02-14 DIAGNOSIS — K582 Mixed irritable bowel syndrome: Secondary | ICD-10-CM | POA: Diagnosis not present

## 2021-02-14 NOTE — Patient Instructions (Addendum)
P[employer] Runner, broadcasting/film/video on [employer] to use when wiping verses using toilet paper.    You have been referred to [facility]-Dr Richwood.  Make certain to bring a list of current medications, including any over the counter medications or vitamins. Also bring your co-pay if you have one as well as your insurance cards. [facility] is located at [address]. If you have not heard from their office in 1-2 week, please contact them at [phone].  If you are age 80 or younger, your body mass index should be between 19-25. Your Body mass index is 23.33 kg/m. If this is out of the aformentioned range listed, please consider follow up with your Primary Care Provider.   Thank you for choosing me and [facility].  Dr. Stan Head
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
## 2021-02-15 ENCOUNTER — Other Ambulatory Visit: Payer: Self-pay | Admitting: Internal Medicine

## 2021-02-15 ENCOUNTER — Other Ambulatory Visit: Payer: Self-pay | Admitting: Physician Assistant

## 2021-03-01 ENCOUNTER — Ambulatory Visit (HOSPITAL_COMMUNITY)
Admission: EM | Admit: 2021-03-01 | Discharge: 2021-03-01 | Disposition: A | Discharge status: Home / Self Care | Payer: Medicaid Other | Attending: Family Medicine | Admitting: Family Medicine

## 2021-03-01 ENCOUNTER — Emergency Department (HOSPITAL_BASED_OUTPATIENT_CLINIC_OR_DEPARTMENT_OTHER)
Admission: EM | Admit: 2021-03-01 | Discharge: 2021-03-01 | Disposition: A | Discharge status: Home / Self Care | Payer: Medicaid Other | Attending: Emergency Medicine | Admitting: Emergency Medicine

## 2021-03-01 ENCOUNTER — Encounter (HOSPITAL_COMMUNITY): Payer: Self-pay | Admitting: *Deleted

## 2021-03-01 ENCOUNTER — Other Ambulatory Visit: Payer: Self-pay

## 2021-03-01 ENCOUNTER — Encounter (HOSPITAL_BASED_OUTPATIENT_CLINIC_OR_DEPARTMENT_OTHER): Payer: Self-pay | Admitting: Obstetrics and Gynecology

## 2021-03-01 DIAGNOSIS — H66001 Acute suppurative otitis media without spontaneous rupture of ear drum, right ear: Secondary | ICD-10-CM

## 2021-03-01 DIAGNOSIS — H6691 Otitis media, unspecified, right ear: Secondary | ICD-10-CM | POA: Insufficient documentation

## 2021-03-01 DIAGNOSIS — Z8616 Personal history of COVID-19: Secondary | ICD-10-CM | POA: Diagnosis not present

## 2021-03-01 DIAGNOSIS — J069 Acute upper respiratory infection, unspecified: Secondary | ICD-10-CM | POA: Diagnosis not present

## 2021-03-01 DIAGNOSIS — J029 Acute pharyngitis, unspecified: Secondary | ICD-10-CM | POA: Diagnosis present

## 2021-03-01 MED ORDER — METHYLPREDNISOLONE 4 MG PO TBPK: ORAL_TABLET | ORAL | 0 refills | Status: DC

## 2021-03-01 MED ORDER — CEFDINIR 300 MG PO CAPS: 300.0000 mg | ORAL_CAPSULE | Freq: Two times a day (BID) | ORAL | 0 refills | Status: AC

## 2021-03-01 MED ORDER — SULFAMETHOXAZOLE-TRIMETHOPRIM 800-160 MG PO TABS: 1.0000 | ORAL_TABLET | Freq: Two times a day (BID) | ORAL | 0 refills | Status: DC
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
## 2021-03-01 NOTE — ED Triage Notes (Signed)
Pt reports ear pain on Rt with congestion.

## 2021-03-01 NOTE — ED Triage Notes (Signed)
Patient reports pain in the right ear, nausea, sore throat. Patient reports pain with chewing and swallowing.
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
## 2021-03-01 NOTE — ED Notes (Signed)
Pt verbalizes understanding of discharge instructions. Opportunity for questioning and answers were provided. Armand removed by staff, pt discharged from ED to home. Instructed to pick up Rx.  

## 2021-03-07 ENCOUNTER — Other Ambulatory Visit: Payer: Self-pay

## 2021-03-07 ENCOUNTER — Emergency Department (HOSPITAL_BASED_OUTPATIENT_CLINIC_OR_DEPARTMENT_OTHER)
Admission: EM | Admit: 2021-03-07 | Discharge: 2021-03-07 | Disposition: A | Discharge status: Home / Self Care | Payer: Medicaid Other | Attending: Emergency Medicine | Admitting: Emergency Medicine

## 2021-03-07 ENCOUNTER — Encounter (HOSPITAL_BASED_OUTPATIENT_CLINIC_OR_DEPARTMENT_OTHER): Payer: Self-pay | Admitting: *Deleted

## 2021-03-07 ENCOUNTER — Emergency Department (HOSPITAL_BASED_OUTPATIENT_CLINIC_OR_DEPARTMENT_OTHER): Payer: Medicaid Other

## 2021-03-07 DIAGNOSIS — R000 Tachycardia, unspecified: Secondary | ICD-10-CM | POA: Diagnosis not present

## 2021-03-07 DIAGNOSIS — L539 Erythematous condition, unspecified: Secondary | ICD-10-CM | POA: Diagnosis not present

## 2021-03-07 DIAGNOSIS — R1013 Epigastric pain: Secondary | ICD-10-CM | POA: Insufficient documentation

## 2021-03-07 DIAGNOSIS — Z8616 Personal history of COVID-19: Secondary | ICD-10-CM | POA: Diagnosis not present

## 2021-03-07 DIAGNOSIS — R1011 Right upper quadrant pain: Secondary | ICD-10-CM | POA: Diagnosis not present

## 2021-03-07 DIAGNOSIS — Z20822 Contact with and (suspected) exposure to covid-19: Secondary | ICD-10-CM | POA: Insufficient documentation

## 2021-03-07 DIAGNOSIS — B349 Viral infection, unspecified: Secondary | ICD-10-CM | POA: Insufficient documentation

## 2021-03-07 DIAGNOSIS — R509 Fever, unspecified: Secondary | ICD-10-CM | POA: Diagnosis present

## 2021-03-07 DIAGNOSIS — R1012 Left upper quadrant pain: Secondary | ICD-10-CM | POA: Insufficient documentation

## 2021-03-07 LAB — CBC WITH DIFFERENTIAL/PLATELET
Abs Immature Granulocytes: 0.07 10*3/uL (ref 0.00–0.07)
Basophils Absolute: 0 10*3/uL (ref 0.0–0.1)
Basophils Relative: 0 %
Eosinophils Absolute: 0 10*3/uL (ref 0.0–0.5)
Eosinophils Relative: 0 %
HCT: 46.1 % (ref 39.0–52.0)
Hemoglobin: 15.8 g/dL (ref 13.0–17.0)
Immature Granulocytes: 1 %
Lymphocytes Relative: 10 %
Lymphs Abs: 1.1 10*3/uL (ref 0.7–4.0)
MCH: 30.7 pg (ref 26.0–34.0)
MCHC: 34.3 g/dL (ref 30.0–36.0)
MCV: 89.7 fL (ref 80.0–100.0)
Monocytes Absolute: 1.2 10*3/uL — ABNORMAL HIGH (ref 0.1–1.0)
Monocytes Relative: 11 %
Neutro Abs: 9.1 10*3/uL — ABNORMAL HIGH (ref 1.7–7.7)
Neutrophils Relative %: 78 %
Platelets: 198 10*3/uL (ref 150–400)
RBC: 5.14 MIL/uL (ref 4.22–5.81)
RDW: 12.6 % (ref 11.5–15.5)
WBC: 11.6 10*3/uL — ABNORMAL HIGH (ref 4.0–10.5)
nRBC: 0 % (ref 0.0–0.2)

## 2021-03-07 LAB — RESP PANEL BY RT-PCR (FLU A&B, COVID) ARPGX2
Influenza A by PCR: NEGATIVE
Influenza B by PCR: NEGATIVE
SARS Coronavirus 2 by RT PCR: NEGATIVE

## 2021-03-07 LAB — URINALYSIS, ROUTINE W REFLEX MICROSCOPIC
Bilirubin Urine: NEGATIVE
Glucose, UA: NEGATIVE mg/dL
Hgb urine dipstick: NEGATIVE
Ketones, ur: 40 mg/dL — AB
Leukocytes,Ua: NEGATIVE
Nitrite: NEGATIVE
Protein, ur: 30 mg/dL — AB
Specific Gravity, Urine: 1.029 (ref 1.005–1.030)
pH: 8.5 — ABNORMAL HIGH (ref 5.0–8.0)

## 2021-03-07 LAB — COMPREHENSIVE METABOLIC PANEL
ALT: 22 U/L (ref 0–44)
AST: 17 U/L (ref 15–41)
Albumin: 4.4 g/dL (ref 3.5–5.0)
Alkaline Phosphatase: 82 U/L (ref 38–126)
Anion gap: 13 (ref 5–15)
BUN: 14 mg/dL (ref 6–20)
CO2: 24 mmol/L (ref 22–32)
Calcium: 9.1 mg/dL (ref 8.9–10.3)
Chloride: 100 mmol/L (ref 98–111)
Creatinine, Ser: 0.83 mg/dL (ref 0.61–1.24)
GFR, Estimated: >60 mL/min (ref >60)
Glucose, Bld: 96 mg/dL (ref 70–99)
Potassium: 3.5 mmol/L (ref 3.5–5.1)
Sodium: 137 mmol/L (ref 135–145)
Total Bilirubin: 0.7 mg/dL (ref 0.3–1.2)
Total Protein: 7.3 g/dL (ref 6.5–8.1)

## 2021-03-07 LAB — LIPASE, BLOOD: Lipase: 25 U/L (ref 11–51)

## 2021-03-07 LAB — MONONUCLEOSIS SCREEN: Mono Screen: NEGATIVE

## 2021-03-07 LAB — GROUP A STREP BY PCR: Group A Strep by PCR: NOT DETECTED

## 2021-03-07 MED ORDER — METOCLOPRAMIDE HCL 10 MG PO TABS: 10.0000 mg | ORAL_TABLET | Freq: Four times a day (QID) | ORAL | 0 refills | Status: DC | PRN

## 2021-03-07 MED ORDER — SODIUM CHLORIDE 0.9 % IV BOLUS
1000.0000 mL | Freq: Once | INTRAVENOUS | Status: AC
  Administered 2021-03-07: 1000 mL via INTRAVENOUS

## 2021-03-07 MED ORDER — ONDANSETRON HCL 4 MG/2ML IJ SOLN
4.0000 mg | Freq: Once | INTRAMUSCULAR | Status: AC
  Administered 2021-03-07: 4 mg via INTRAVENOUS
  Filled 2021-03-07: qty 2

## 2021-03-07 MED ORDER — SODIUM CHLORIDE 0.9 % IV SOLN: Freq: Once | INTRAVENOUS | Status: AC

## 2021-03-07 MED ORDER — FENTANYL CITRATE (PF) 100 MCG/2ML IJ SOLN
100.0000 ug | Freq: Once | INTRAMUSCULAR | Status: AC
Start: 2021-03-07 — End: 2021-03-07
  Administered 2021-03-07: 100 ug via INTRAVENOUS
  Filled 2021-03-07: qty 2

## 2021-03-07 MED ORDER — METOCLOPRAMIDE HCL 5 MG/ML IJ SOLN
10.0000 mg | Freq: Once | INTRAMUSCULAR | Status: AC
  Administered 2021-03-07: 10 mg via INTRAVENOUS
  Filled 2021-03-07: qty 2

## 2021-03-07 NOTE — ED Triage Notes (Signed)
Patient was here on the 20th of May with a c/o right ear, nausea and sore throat and was placed on Cefnidir and Prednisone and completed the treatment with no relief.  Pt came today with a T of 102.5.
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
## 2021-03-07 NOTE — Discharge Instructions (Addendum)
If you develop high fever, severe cough or cough with blood, trouble breathing, severe headache, neck pain/stiffness, vomiting, or any other new/concerning symptoms then return to the ER for evaluation  

## 2021-03-12 ENCOUNTER — Other Ambulatory Visit: Payer: Self-pay | Admitting: Physician Assistant

## 2021-04-22 ENCOUNTER — Other Ambulatory Visit: Payer: Self-pay | Admitting: Physician Assistant

## 2021-05-13 ENCOUNTER — Other Ambulatory Visit: Payer: Self-pay | Admitting: Physician Assistant

## 2021-05-13 ENCOUNTER — Other Ambulatory Visit: Payer: Self-pay | Admitting: Internal Medicine

## 2021-06-18 ENCOUNTER — Other Ambulatory Visit: Payer: Self-pay | Admitting: Physician Assistant

## 2021-07-15 ENCOUNTER — Other Ambulatory Visit: Payer: Self-pay | Admitting: Physician Assistant

## 2021-07-15 ENCOUNTER — Other Ambulatory Visit: Payer: Self-pay | Admitting: Internal Medicine

## 2021-07-16 NOTE — Telephone Encounter (Signed)
Spoke with Lottie Rater and he said they cancelled his appointment several times and then they didn't call him back and he just sorta forgot about it. I can call them tomorrow at [facility] to follow up Sir. He is still having issues and they is why requested the refill.

## 2021-07-16 NOTE — Telephone Encounter (Signed)
Please advise Sir? 

## 2021-07-16 NOTE — Telephone Encounter (Signed)
The last time he was here (May) he was supposed to have seen Dr. Cliffton Asters at [facility]  I cannot find a record of this.  Did he see Dr. Cliffton Asters?

## 2021-07-17 NOTE — Telephone Encounter (Signed)
I have left him a detailed message that we are referring him again to [facility] and for him to call us to go over which medicines is he taking for his rectal issues.

## 2021-07-17 NOTE — Telephone Encounter (Signed)
Need to refer him again  Please find out what hydrocortisone creams and suppositories he is using now if at all  He has 3 different ones on med list

## 2021-07-18 NOTE — Telephone Encounter (Signed)
Left him another detailed message to please call me back.

## 2021-07-31 ENCOUNTER — Telehealth: Payer: Self-pay | Admitting: Internal Medicine

## 2021-07-31 NOTE — Telephone Encounter (Signed)
Blake Bradley called me back and we have an updated medicine list now of the rectal meds he is using. May I refill this request. He has not heard yet from [facility] so I gave him the # and told him to call them tomorrow to make sure he hasn't missed a call from them.

## 2021-07-31 NOTE — Telephone Encounter (Signed)
Patient returned your call.  Please call back.  Thank you. 

## 2021-07-31 NOTE — Telephone Encounter (Signed)
Called him back and I have updated his medicine list.

## 2021-08-01 ENCOUNTER — Telehealth: Payer: Self-pay

## 2021-08-01 NOTE — Telephone Encounter (Signed)
October 5th 2022 a referral to [facility] was placed for eval / treat his bleeding external hemorroids. I called them today and they said they are still working on it. I left a message for the referral person to call me back so we can get a better idea of when this will be done. I called and had to leave a voicemail for Marl that this is still in the works. I also let him know that Dr Leone Payor refilled his Procto-med but that he won't continue to do so , that he needs to be seen at [facility]. He was referred back in May 2022 and never got seen for one reason or another.

## 2021-08-01 NOTE — Telephone Encounter (Signed)
I left Blake Bradley a message that his medicine has been refilled and that he needs to be seen by [facility] that Dr Leone Payor is not going to continue to refill this rx.

## 2021-08-03 ENCOUNTER — Other Ambulatory Visit: Payer: Self-pay | Admitting: Internal Medicine

## 2021-08-05 ENCOUNTER — Other Ambulatory Visit: Payer: Self-pay | Admitting: Physician Assistant

## 2021-08-12 NOTE — Telephone Encounter (Signed)
I spoke to [facility] and they called and left him a message to call them back. I called and had to left him a voicemail message which I did asking him to call them and book an appointment. I left there # as well [phone].

## 2021-08-26 NOTE — Telephone Encounter (Signed)
Called and had to leave another voicemail message to please call [facility] if he hasn't done so already to set up an appointment.

## 2021-09-04 NOTE — Telephone Encounter (Signed)
I spoke to Driscoll and he said he's had the flu for 2 weeks and is just now starting to feel better. He plans on calling [facility] next week to set up an appointment. He states he has their #.

## 2021-09-13 ENCOUNTER — Other Ambulatory Visit: Payer: Self-pay | Admitting: Physician Assistant

## 2021-09-13 NOTE — Telephone Encounter (Signed)
Correct - will not refill at this time

## 2021-09-13 NOTE — Telephone Encounter (Signed)
Reviewed last phone note by PJ about patient being referred to [facility]. Per her phone note, we will no longer refill patient's suppositories. Please confirm if this is the case, so I can deny this.

## 2021-09-16 ENCOUNTER — Other Ambulatory Visit: Payer: Self-pay | Admitting: Physician Assistant

## 2021-09-18 ENCOUNTER — Telehealth: Payer: Self-pay | Admitting: Internal Medicine

## 2021-09-18 NOTE — Telephone Encounter (Signed)
Inbound call from patient, stated that he had tried to call [facility] and got no answer yet to discuss possibly having surgery. Stated that he is currently bleeding and is seeking advice. Please advise.

## 2021-09-18 NOTE — Telephone Encounter (Signed)
I recommend prep H OTC Want to avoid overuse of steroid creams that may thin the skin and other tissues

## 2021-09-18 NOTE — Telephone Encounter (Signed)
I called [facility] and made Markelle an appointment for 10/21/2021 at 10:15am, arrive at 9:45am with Dr Michaell Cowing. He is on the cancellation list as well. They were playing phone tag.   He said he's not bleeding a lot but that the anusol suppositories really help when he uses them for 4-5 nights then he won't bleed for a month.   Please advise Sir. Thank you. Raylen is aware of the upcoming appointment.

## 2021-09-19 NOTE — Telephone Encounter (Signed)
Patient informed and said thank you. 

## 2021-11-13 ENCOUNTER — Emergency Department (HOSPITAL_BASED_OUTPATIENT_CLINIC_OR_DEPARTMENT_OTHER)
Admission: EM | Admit: 2021-11-13 | Discharge: 2021-11-14 | Disposition: A | Discharge status: Home / Self Care | Payer: Medicaid Other | Attending: Emergency Medicine | Admitting: Emergency Medicine

## 2021-11-13 ENCOUNTER — Other Ambulatory Visit: Payer: Self-pay

## 2021-11-13 ENCOUNTER — Encounter (HOSPITAL_BASED_OUTPATIENT_CLINIC_OR_DEPARTMENT_OTHER): Payer: Self-pay

## 2021-11-13 DIAGNOSIS — Z79899 Other long term (current) drug therapy: Secondary | ICD-10-CM | POA: Diagnosis not present

## 2021-11-13 DIAGNOSIS — Z20822 Contact with and (suspected) exposure to covid-19: Secondary | ICD-10-CM | POA: Diagnosis not present

## 2021-11-13 DIAGNOSIS — R112 Nausea with vomiting, unspecified: Secondary | ICD-10-CM

## 2021-11-13 DIAGNOSIS — Z8616 Personal history of COVID-19: Secondary | ICD-10-CM | POA: Insufficient documentation

## 2021-11-13 DIAGNOSIS — R1084 Generalized abdominal pain: Secondary | ICD-10-CM | POA: Diagnosis not present

## 2021-11-13 LAB — CBC
HCT: 45.9 % (ref 39.0–52.0)
Hemoglobin: 15.4 g/dL (ref 13.0–17.0)
MCH: 29.8 pg (ref 26.0–34.0)
MCHC: 33.6 g/dL (ref 30.0–36.0)
MCV: 88.8 fL (ref 80.0–100.0)
Platelets: 207 10*3/uL (ref 150–400)
RBC: 5.17 MIL/uL (ref 4.22–5.81)
RDW: 12.6 % (ref 11.5–15.5)
WBC: 9.8 10*3/uL (ref 4.0–10.5)
nRBC: 0 % (ref 0.0–0.2)

## 2021-11-13 LAB — URINALYSIS, ROUTINE W REFLEX MICROSCOPIC
Bilirubin Urine: NEGATIVE
Glucose, UA: NEGATIVE mg/dL
Hgb urine dipstick: NEGATIVE
Leukocytes,Ua: NEGATIVE
Nitrite: NEGATIVE
Protein, ur: 30 mg/dL — AB
Specific Gravity, Urine: 1.037 — ABNORMAL HIGH (ref 1.005–1.030)
pH: 6 (ref 5.0–8.0)

## 2021-11-13 MED ORDER — ONDANSETRON HCL 4 MG/2ML IJ SOLN
4.0000 mg | Freq: Once | INTRAMUSCULAR | Status: AC
  Administered 2021-11-14: 4 mg via INTRAVENOUS
  Filled 2021-11-13: qty 2

## 2021-11-13 MED ORDER — ONDANSETRON HCL 4 MG/2ML IJ SOLN: 4.0000 mg | Freq: Once | INTRAMUSCULAR | Status: DC | PRN

## 2021-11-13 MED ORDER — SODIUM CHLORIDE 0.9 % IV BOLUS
1000.0000 mL | Freq: Once | INTRAVENOUS | Status: AC
  Administered 2021-11-14: 1000 mL via INTRAVENOUS

## 2021-11-13 NOTE — ED Triage Notes (Signed)
Lower abdominal pain with nausea and vomiting since around 4 am this morning. Does have feel pressure when urinating. No diarrhea.

## 2021-11-14 ENCOUNTER — Emergency Department (HOSPITAL_BASED_OUTPATIENT_CLINIC_OR_DEPARTMENT_OTHER): Payer: Medicaid Other

## 2021-11-14 LAB — COMPREHENSIVE METABOLIC PANEL
ALT: 14 U/L (ref 0–44)
AST: 17 U/L (ref 15–41)
Albumin: 4.4 g/dL (ref 3.5–5.0)
Alkaline Phosphatase: 74 U/L (ref 38–126)
Anion gap: 11 (ref 5–15)
BUN: 17 mg/dL (ref 6–20)
CO2: 24 mmol/L (ref 22–32)
Calcium: 8.7 mg/dL — ABNORMAL LOW (ref 8.9–10.3)
Chloride: 103 mmol/L (ref 98–111)
Creatinine, Ser: 1.1 mg/dL (ref 0.61–1.24)
GFR, Estimated: >60 mL/min (ref >60)
Glucose, Bld: 114 mg/dL — ABNORMAL HIGH (ref 70–99)
Potassium: 3.3 mmol/L — ABNORMAL LOW (ref 3.5–5.1)
Sodium: 138 mmol/L (ref 135–145)
Total Bilirubin: 1.2 mg/dL (ref 0.3–1.2)
Total Protein: 6.9 g/dL (ref 6.5–8.1)

## 2021-11-14 LAB — RESP PANEL BY RT-PCR (FLU A&B, COVID) ARPGX2
Influenza A by PCR: NEGATIVE
Influenza B by PCR: NEGATIVE
SARS Coronavirus 2 by RT PCR: NEGATIVE

## 2021-11-14 LAB — LIPASE, BLOOD: Lipase: 25 U/L (ref 11–51)

## 2021-11-14 MED ORDER — PROCHLORPERAZINE EDISYLATE 10 MG/2ML IJ SOLN
10.0000 mg | Freq: Once | INTRAMUSCULAR | Status: AC
  Administered 2021-11-14: 10 mg via INTRAVENOUS
  Filled 2021-11-14: qty 2

## 2021-11-14 MED ORDER — PROMETHAZINE HCL 25 MG PO TABS: 25.0000 mg | ORAL_TABLET | Freq: Four times a day (QID) | ORAL | 0 refills | Status: DC | PRN

## 2021-11-14 MED ORDER — DIPHENHYDRAMINE HCL 50 MG/ML IJ SOLN
25.0000 mg | Freq: Once | INTRAMUSCULAR | Status: AC
  Administered 2021-11-14: 25 mg via INTRAVENOUS
  Filled 2021-11-14: qty 1

## 2021-11-14 NOTE — ED Notes (Signed)
Tolerated po challenge

## 2021-11-14 NOTE — Discharge Instructions (Signed)
You were seen today for nausea and vomiting.  Your COVID and influenza testing are negative.  The rest of your work-up is reassuring.  Make sure that you are staying hydrated.  Take Phenergan as needed for vomiting.
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
## 2021-11-15 ENCOUNTER — Emergency Department (HOSPITAL_BASED_OUTPATIENT_CLINIC_OR_DEPARTMENT_OTHER)
Admission: EM | Admit: 2021-11-15 | Discharge: 2021-11-16 | Disposition: A | Discharge status: Home / Self Care | Payer: Medicaid Other | Attending: Emergency Medicine | Admitting: Emergency Medicine

## 2021-11-15 ENCOUNTER — Encounter (HOSPITAL_BASED_OUTPATIENT_CLINIC_OR_DEPARTMENT_OTHER): Payer: Self-pay

## 2021-11-15 ENCOUNTER — Other Ambulatory Visit: Payer: Self-pay

## 2021-11-15 DIAGNOSIS — R319 Hematuria, unspecified: Secondary | ICD-10-CM | POA: Diagnosis present

## 2021-11-15 DIAGNOSIS — N3091 Cystitis, unspecified with hematuria: Secondary | ICD-10-CM | POA: Diagnosis not present

## 2021-11-15 DIAGNOSIS — Z8616 Personal history of COVID-19: Secondary | ICD-10-CM | POA: Insufficient documentation

## 2021-11-15 LAB — CBC
HCT: 41.6 % (ref 39.0–52.0)
Hemoglobin: 14.3 g/dL (ref 13.0–17.0)
MCH: 29.9 pg (ref 26.0–34.0)
MCHC: 34.4 g/dL (ref 30.0–36.0)
MCV: 87 fL (ref 80.0–100.0)
Platelets: 190 10*3/uL (ref 150–400)
RBC: 4.78 MIL/uL (ref 4.22–5.81)
RDW: 12.3 % (ref 11.5–15.5)
WBC: 14.1 10*3/uL — ABNORMAL HIGH (ref 4.0–10.5)
nRBC: 0 % (ref 0.0–0.2)

## 2021-11-15 LAB — COMPREHENSIVE METABOLIC PANEL
ALT: 13 U/L (ref 0–44)
AST: 19 U/L (ref 15–41)
Albumin: 4.3 g/dL (ref 3.5–5.0)
Alkaline Phosphatase: 61 U/L (ref 38–126)
Anion gap: 9 (ref 5–15)
BUN: 15 mg/dL (ref 6–20)
CO2: 26 mmol/L (ref 22–32)
Calcium: 9.1 mg/dL (ref 8.9–10.3)
Chloride: 102 mmol/L (ref 98–111)
Creatinine, Ser: 0.94 mg/dL (ref 0.61–1.24)
GFR, Estimated: >60 mL/min (ref >60)
Glucose, Bld: 99 mg/dL (ref 70–99)
Potassium: 3.1 mmol/L — ABNORMAL LOW (ref 3.5–5.1)
Sodium: 137 mmol/L (ref 135–145)
Total Bilirubin: 1.2 mg/dL (ref 0.3–1.2)
Total Protein: 7.1 g/dL (ref 6.5–8.1)

## 2021-11-15 LAB — LIPASE, BLOOD: Lipase: 25 U/L (ref 11–51)

## 2021-11-15 NOTE — ED Triage Notes (Signed)
Patient here POV from Home with Hematuria.  Patient states he was seen 2 days PTA for ABD Pain and was discharged.   Patient here today due to Same Symptoms but also due to Acute Onset of Hematuria this AM.   No Fevers. Moderate Nausea. No Emesis. No Constipation/Diarrhea. Painful to Urinate.  NAD Noted during Triage. A&Ox4. GCS 15. Ambulatory.

## 2021-11-15 NOTE — ED Notes (Signed)
Patient unable to provide UA; collection cup provided for Waiting Area.

## 2021-11-16 ENCOUNTER — Emergency Department (HOSPITAL_BASED_OUTPATIENT_CLINIC_OR_DEPARTMENT_OTHER): Payer: Medicaid Other

## 2021-11-16 ENCOUNTER — Encounter (HOSPITAL_BASED_OUTPATIENT_CLINIC_OR_DEPARTMENT_OTHER): Payer: Self-pay | Admitting: Emergency Medicine

## 2021-11-16 LAB — URINALYSIS, ROUTINE W REFLEX MICROSCOPIC
RBC / HPF: >50 RBC/hpf — ABNORMAL HIGH (ref 0–5)
WBC, UA: >50 WBC/hpf — ABNORMAL HIGH (ref 0–5)

## 2021-11-16 MED ORDER — HALOPERIDOL LACTATE 5 MG/ML IJ SOLN
2.0000 mg | Freq: Once | INTRAMUSCULAR | Status: AC
  Administered 2021-11-16: 2 mg via INTRAVENOUS
  Filled 2021-11-16: qty 1

## 2021-11-16 MED ORDER — DICYCLOMINE HCL 10 MG/ML IM SOLN
20.0000 mg | Freq: Once | INTRAMUSCULAR | Status: AC
  Administered 2021-11-16: 20 mg via INTRAMUSCULAR
  Filled 2021-11-16: qty 2

## 2021-11-16 MED ORDER — IOHEXOL 300 MG/ML  SOLN
100.0000 mL | Freq: Once | INTRAMUSCULAR | Status: AC | PRN
  Administered 2021-11-16: 80 mL via INTRAVENOUS

## 2021-11-16 MED ORDER — SULFAMETHOXAZOLE-TRIMETHOPRIM 800-160 MG PO TABS: 1.0000 | ORAL_TABLET | Freq: Two times a day (BID) | ORAL | 0 refills | Status: AC
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
## 2021-11-17 LAB — URINE CULTURE
Culture: <10000 — AB
Special Requests: NORMAL

## 2022-01-04 ENCOUNTER — Emergency Department (HOSPITAL_BASED_OUTPATIENT_CLINIC_OR_DEPARTMENT_OTHER): Payer: Medicaid Other

## 2022-01-04 ENCOUNTER — Emergency Department (HOSPITAL_BASED_OUTPATIENT_CLINIC_OR_DEPARTMENT_OTHER)
Admission: EM | Admit: 2022-01-04 | Discharge: 2022-01-04 | Discharge status: Against Medical Advice | Payer: Medicaid Other | Attending: Emergency Medicine | Admitting: Emergency Medicine

## 2022-01-04 ENCOUNTER — Other Ambulatory Visit: Payer: Self-pay

## 2022-01-04 ENCOUNTER — Encounter (HOSPITAL_BASED_OUTPATIENT_CLINIC_OR_DEPARTMENT_OTHER): Payer: Self-pay | Admitting: *Deleted

## 2022-01-04 DIAGNOSIS — R42 Dizziness and giddiness: Secondary | ICD-10-CM | POA: Diagnosis not present

## 2022-01-04 DIAGNOSIS — R519 Headache, unspecified: Secondary | ICD-10-CM | POA: Insufficient documentation

## 2022-01-04 DIAGNOSIS — R531 Weakness: Secondary | ICD-10-CM | POA: Diagnosis present

## 2022-01-04 LAB — COMPREHENSIVE METABOLIC PANEL
ALT: 26 U/L (ref 0–44)
AST: 43 U/L — ABNORMAL HIGH (ref 15–41)
Albumin: 4.9 g/dL (ref 3.5–5.0)
Alkaline Phosphatase: 81 U/L (ref 38–126)
Anion gap: 9 (ref 5–15)
BUN: 16 mg/dL (ref 6–20)
CO2: 27 mmol/L (ref 22–32)
Calcium: 9.8 mg/dL (ref 8.9–10.3)
Chloride: 103 mmol/L (ref 98–111)
Creatinine, Ser: 1.32 mg/dL — ABNORMAL HIGH (ref 0.61–1.24)
GFR, Estimated: >60 mL/min (ref >60)
Glucose, Bld: 90 mg/dL (ref 70–99)
Potassium: 3.4 mmol/L — ABNORMAL LOW (ref 3.5–5.1)
Sodium: 139 mmol/L (ref 135–145)
Total Bilirubin: 0.8 mg/dL (ref 0.3–1.2)
Total Protein: 7.4 g/dL (ref 6.5–8.1)

## 2022-01-04 LAB — DIFFERENTIAL
Abs Immature Granulocytes: 0.02 10*3/uL (ref 0.00–0.07)
Basophils Absolute: 0.1 10*3/uL (ref 0.0–0.1)
Basophils Relative: 1 %
Eosinophils Absolute: 0.4 10*3/uL (ref 0.0–0.5)
Eosinophils Relative: 4 %
Immature Granulocytes: 0 %
Lymphocytes Relative: 29 %
Lymphs Abs: 2.5 10*3/uL (ref 0.7–4.0)
Monocytes Absolute: 0.7 10*3/uL (ref 0.1–1.0)
Monocytes Relative: 9 %
Neutro Abs: 4.9 10*3/uL (ref 1.7–7.7)
Neutrophils Relative %: 57 %

## 2022-01-04 LAB — URINALYSIS, ROUTINE W REFLEX MICROSCOPIC
Bilirubin Urine: NEGATIVE
Glucose, UA: NEGATIVE mg/dL
Hgb urine dipstick: NEGATIVE
Ketones, ur: 40 mg/dL — AB
Leukocytes,Ua: NEGATIVE
Nitrite: NEGATIVE
Specific Gravity, Urine: 1.029 (ref 1.005–1.030)
pH: 6 (ref 5.0–8.0)

## 2022-01-04 LAB — RAPID URINE DRUG SCREEN, HOSP PERFORMED
Amphetamines: NOT DETECTED
Barbiturates: NOT DETECTED
Benzodiazepines: NOT DETECTED
Cocaine: NOT DETECTED
Opiates: NOT DETECTED
Tetrahydrocannabinol: NOT DETECTED

## 2022-01-04 LAB — CBC
HCT: 43.5 % (ref 39.0–52.0)
Hemoglobin: 14.8 g/dL (ref 13.0–17.0)
MCH: 30.2 pg (ref 26.0–34.0)
MCHC: 34 g/dL (ref 30.0–36.0)
MCV: 88.8 fL (ref 80.0–100.0)
Platelets: 281 10*3/uL (ref 150–400)
RBC: 4.9 MIL/uL (ref 4.22–5.81)
RDW: 13.7 % (ref 11.5–15.5)
WBC: 8.6 10*3/uL (ref 4.0–10.5)
nRBC: 0 % (ref 0.0–0.2)

## 2022-01-04 LAB — ETHANOL: Alcohol, Ethyl (B): 14 mg/dL — ABNORMAL HIGH (ref <10)

## 2022-01-04 MED ORDER — IOHEXOL 350 MG/ML SOLN
100.0000 mL | Freq: Once | INTRAVENOUS | Status: AC | PRN
  Administered 2022-01-04: 75 mL via INTRAVENOUS

## 2022-01-04 MED ORDER — PROCHLORPERAZINE EDISYLATE 10 MG/2ML IJ SOLN
10.0000 mg | Freq: Once | INTRAMUSCULAR | Status: DC
Start: 2022-01-04 — End: 2022-01-04
  Filled 2022-01-04: qty 2

## 2022-01-04 MED ORDER — DIPHENHYDRAMINE HCL 50 MG/ML IJ SOLN
25.0000 mg | Freq: Once | INTRAMUSCULAR | Status: DC
  Filled 2022-01-04: qty 1

## 2022-01-04 MED ORDER — METOCLOPRAMIDE HCL 5 MG/ML IJ SOLN
10.0000 mg | Freq: Once | INTRAMUSCULAR | Status: AC
  Administered 2022-01-04: 10 mg via INTRAVENOUS
  Filled 2022-01-04: qty 2

## 2022-01-04 NOTE — Discharge Instructions (Signed)
Go to the [facility] now for an MRI 

## 2022-01-04 NOTE — ED Notes (Signed)
Patient going POV to [facility] for left sided weakness and left sided facial droop  ?

## 2022-01-04 NOTE — ED Provider Notes (Signed)
?[facility] EMERGENCY DEPT ?Provider Note ? ? ?CSN: [id number] ?Arrival date & time: 01/04/22  0011 ? ?  ? ?History ? ?Chief Complaint  ?Patient presents with  ? Headache  ? ? ?Blake Bradley is a 31 y.o. male. ? ?31 yo M with a chief complaints of dizziness and one-sided facial changes and then left lower extremity weakness.  Tells me this happened earlier this evening.  He was in the gym and was running on a treadmill and as he was going from the treadmill to a stationary bike he felt a little bit lightheaded his girlfriend was there and noticed that he had some one-sided facial drooping.  He felt tingling count of all over his face but worse in the left than the right.  This resolved over about an hour while he continued to work out.  He developed a headache and then felt like he was having some left leg pain.  He had called his father who used to work as a Radiation protection practitioner and told him that he could be having a stroke and that he should come to the emergency department for evaluation.  ? ? ?Headache ? ?  ? ?Home Medications ?Prior to Admission medications   ?Medication Sig Start Date End Date Taking? Authorizing Provider  ?ALPRAZolam (XANAX) 0.25 MG tablet Take 0.25 mg by mouth daily as needed. 02/11/21   [provider]  ?ANUCORT-HC 25 MG suppository INSERT 1 SUPPOSITORY RECTALLY AT BEDTIME AS NEEDED FOR  HEMORRHOIDS  OR  ANAL  ITCHING 08/05/21   Esterwood, Amy S, PA-C  ?dicyclomine (BENTYL) 20 MG tablet TAKE 1 TABLET BY MOUTH 4 TIMES DAILY BEFORE MEAL(S) AND AT BEDTIME 04/23/21   Esterwood, Amy S, PA-C  ?methylPREDNISolone (MEDROL DOSEPAK) 4 MG TBPK tablet Use as directed 03/01/21   Pollyann Savoy, MD  ?metoCLOPramide (REGLAN) 10 MG tablet Take 1 tablet (10 mg total) by mouth every 6 (six) hours as needed for nausea or vomiting (nausea/headache). 03/07/21   Pricilla Loveless, MD  ?PROCTO-MED [facility] 2.5 % rectal cream INSERT  CREAM RECTALLY TWICE DAILY 08/05/21   Iva Boop, MD  ?promethazine  (PHENERGAN) 25 MG tablet Take 1 tablet (25 mg total) by mouth every 6 (six) hours as needed for nausea or vomiting. 11/14/21   Horton, Mayer Masker, MD  ?Wallene Dales ophthalmic solution Place 1 drop into both eyes daily. 08/27/20   [provider]  ?   ? ?Allergies    ?Amoxicillin, Codeine, Erythromycin base, Ketorolac, Morphine and related, Prednisone, Doxycycline, and Ibuprofen   ? ?Review of Systems   ?Review of Systems  ?Neurological:  Positive for headaches.  ? ?Physical Exam ?Updated Vital Signs ?BP 122/67   Pulse (!) 49   Temp 98.9 ?F (37.2 ?C) (Oral)   Resp 11   Ht 6\' 1"  (1.854 m)   Wt 78.5 kg   SpO2 97%   BMI 22.82 kg/m?  ?Physical Exam ?Vitals and nursing note reviewed.  ?Constitutional:   ?   Appearance: He is well-developed.  ?HENT:  ?   Head: Normocephalic and atraumatic.  ?Eyes:  ?   Pupils: Pupils are equal, round, and reactive to light.  ?Neck:  ?   Vascular: No JVD.  ?Cardiovascular:  ?   Rate and Rhythm: Normal rate and regular rhythm.  ?   Heart sounds: No murmur heard. ?  No friction rub. No gallop.  ?Pulmonary:  ?   Effort: No respiratory distress.  ?   Breath sounds: No wheezing.  ?Abdominal:  ?  General: There is no distension.  ?   Tenderness: There is no abdominal tenderness. There is no guarding or rebound.  ?Musculoskeletal:     ?   General: Normal range of motion.  ?   Cervical back: Normal range of motion and neck supple.  ?Skin: ?   Coloration: Skin is not pale.  ?   Findings: No rash.  ?Neurological:  ?   Mental Status: He is alert and oriented to person, place, and time.  ?   GCS: GCS eye subscore is 4. GCS verbal subscore is 5. GCS motor subscore is 6.  ?   Cranial Nerves: Cranial nerves 2-12 are intact.  ?   Sensory: Sensation is intact.  ?   Motor: Motor function is intact.  ?   Comments: Patient has a painful gait.  Describes pain to his left quadriceps.  Mild weakness is likely limited to pain.  Otherwise benign neurologic exam.  ?Psychiatric:     ?   Behavior: Behavior  normal.  ? ? ?ED Results / Procedures / Treatments   ?Labs ?(all labs ordered are listed, but only abnormal results are displayed) ?Labs Reviewed  ?ETHANOL - Abnormal; Notable for the following components:  ?    Result Value  ? Alcohol, Ethyl (B) 14 (*)   ? All other components within normal limits  ?COMPREHENSIVE METABOLIC PANEL - Abnormal; Notable for the following components:  ? Potassium 3.4 (*)   ? Creatinine, Ser 1.32 (*)   ? AST 43 (*)   ? All other components within normal limits  ?URINALYSIS, ROUTINE W REFLEX MICROSCOPIC - Abnormal; Notable for the following components:  ? Ketones, ur 40 (*)   ? Protein, ur TRACE (*)   ? All other components within normal limits  ?CBC  ?DIFFERENTIAL  ?RAPID URINE DRUG SCREEN, HOSP PERFORMED  ? ? ?EKG ?EKG Interpretation ? ?Date/Time:  Saturday January 04 2022 00:49:22 EDT ?Ventricular Rate:  48 ?PR Interval:  165 ?QRS Duration: 107 ?QT Interval:  446 ?QTC Calculation: 399 ?R Axis:   59 ?Text Interpretation: Sinus bradycardia ST elev, probable normal early repol pattern No significant change since last tracing Confirmed by Melene PlanFloyd, Rodert Hinch [phone]) on 01/04/2022 12:55:26 AM ? ?Radiology ?CT ANGIO HEAD NECK W WO CM ? ?Result Date: 01/04/2022 ?CLINICAL DATA:  Headache EXAM: CT ANGIOGRAPHY HEAD AND NECK TECHNIQUE: Multidetector CT imaging of the head and neck was performed using the standard protocol during bolus administration of intravenous contrast. Multiplanar CT image reconstructions and MIPs were obtained to evaluate the vascular anatomy. Carotid stenosis measurements (when applicable) are obtained utilizing NASCET criteria, using the distal internal carotid diameter as the denominator. RADIATION DOSE REDUCTION: This exam was performed according to the departmental dose-optimization program which includes automated exposure control, adjustment of the mA and/or kV according to patient size and/or use of iterative reconstruction technique. CONTRAST:  75mL OMNIPAQUE IOHEXOL 350 MG/ML  SOLN COMPARISON:  None. FINDINGS: CT HEAD FINDINGS Brain: There is no mass, hemorrhage or extra-axial collection. The size and configuration of the ventricles and extra-axial CSF spaces are normal. There is no acute or chronic infarction. The brain parenchyma is normal. Skull: The visualized skull base, calvarium and extracranial soft tissues are normal. Sinuses/Orbits: No fluid levels or advanced mucosal thickening of the visualized paranasal sinuses. No mastoid or middle ear effusion. The orbits are normal. CTA NECK FINDINGS SKELETON: There is no bony spinal canal stenosis. No lytic or blastic lesion. OTHER NECK: Normal pharynx, larynx and major salivary glands. No  cervical lymphadenopathy. Unremarkable thyroid gland. UPPER CHEST: No pneumothorax or pleural effusion. No nodules or masses. AORTIC ARCH: There is no calcific atherosclerosis of the aortic arch. There is no aneurysm, dissection or hemodynamically significant stenosis of the visualized portion of the aorta. Conventional 3 vessel aortic branching pattern. The visualized proximal subclavian arteries are widely patent. RIGHT CAROTID SYSTEM: Normal without aneurysm, dissection or stenosis. LEFT CAROTID SYSTEM: Normal without aneurysm, dissection or stenosis. VERTEBRAL ARTERIES: Left dominant configuration. Both origins are clearly patent. There is no dissection, occlusion or flow-limiting stenosis to the skull base (V1-V3 segments). CTA HEAD FINDINGS POSTERIOR CIRCULATION: --Vertebral arteries: Normal V4 segments. --Inferior cerebellar arteries: Normal. --Basilar artery: Normal. --Superior cerebellar arteries: Normal. --Posterior cerebral arteries (PCA): Normal. ANTERIOR CIRCULATION: --Intracranial internal carotid arteries: Normal. --Anterior cerebral arteries (ACA): Normal. Both A1 segments are present. Patent anterior communicating artery (a-comm). --Middle cerebral arteries (MCA): Normal. VENOUS SINUSES: As permitted by contrast timing, patent. ANATOMIC  VARIANTS: None Review of the MIP images confirms the above findings. IMPRESSION: Normal CTA of the head and neck. Electronically Signed   By: Deatra Robinson M.D.   On: 01/04/2022 01:36   ? ?Procedures ?Procedures

## 2022-01-04 NOTE — ED Notes (Addendum)
Pt going to [facility] ED to ED POV ? ?IV wrapped and secured  ? ?

## 2022-01-04 NOTE — ED Notes (Signed)
Spoke with Blake Bradley with stroke activation, she is aware of teleneuro consult. Pt transported to CT at this time ?

## 2022-01-04 NOTE — ED Notes (Signed)
Patient left with out getting the MRI SCAN. ?

## 2022-01-04 NOTE — ED Triage Notes (Signed)
Pt says that at 1930 tonight, he was on the treadmill, became "more short of breath than usual" and dizzy. He went to the locker room, started having pain in the left side of his body then says he could not open his left eye and drooping of the left side of his mouth. Lasted approx 1 hour. Now having pain in the left leg and headache.  ?

## 2023-06-22 ENCOUNTER — Telehealth: Payer: Self-pay | Admitting: Internal Medicine

## 2023-06-22 NOTE — Telephone Encounter (Signed)
Inbound call from patient stating he received a letter in the mail recently that was dated from over a year ago stating he would no longer be a patient with Dr. Leone Payor. Patient states he in confused as to why he received this letter. Advised patient he was referred over to [facility]. Patient states he was not seen with them. Patient states he saw his PCP regarding issue and was referred elsewhere, patient is unsure where but was advised he was not a candidate or surgery.  Patient states in the past month he has been bleeding from hemorrhoids. Patient requesting a call back to discuss how to mover forward. Please advise, thank you.

## 2023-06-22 NOTE — Telephone Encounter (Signed)
Pt stated that he received a letter about a month ago from our office stating that he would no longer be a pt of Dr. Leone Payor and it was dated from Oct 2023. Pt chart was reviewed and found no such documentation. Pt made aware. Pt stated that he has been having rectal bleeding for about a month now. Pt does have a long history of Hemorrhoids and stated that he is taking every thing he can OTC. Pt was notified that I would call him tomorrow AM and scheduled him for an office visit for 06/30/2023 at 11:00 am. ( 7 day Hold) Pt verbalized understanding with all questions answered.

## 2023-06-23 NOTE — Telephone Encounter (Signed)
Pt was scheduled for an office visit with Hyacinth Meeker PA on 06/30/2023 at 11:00 AM. Pt made aware. Pt verbalized understanding with all questions answered.

## 2023-06-23 NOTE — Telephone Encounter (Signed)
Pt was scheduled for an office visit with Hyacinth Meeker PA on 06/30/2023 at 11:00 AM.  Left message for pt to call back

## 2023-06-30 ENCOUNTER — Encounter: Payer: Self-pay | Admitting: Physician Assistant

## 2023-06-30 ENCOUNTER — Ambulatory Visit (INDEPENDENT_AMBULATORY_CARE_PROVIDER_SITE_OTHER): Payer: Medicaid Other | Admitting: Physician Assistant

## 2023-06-30 VITALS — BP 122/80 | HR 68 | Ht 73.0 in | Wt 175.0 lb

## 2023-06-30 DIAGNOSIS — K648 Other hemorrhoids: Secondary | ICD-10-CM

## 2023-06-30 DIAGNOSIS — K625 Hemorrhage of anus and rectum: Secondary | ICD-10-CM

## 2023-06-30 MED ORDER — HYDROCORTISONE ACETATE 25 MG RE SUPP: 25.0000 mg | Freq: Two times a day (BID) | RECTAL | 1 refills | Status: DC

## 2023-06-30 MED ORDER — HYDROCORTISONE (PERIANAL) 2.5 % EX CREA: 1.0000 | TOPICAL_CREAM | Freq: Two times a day (BID) | CUTANEOUS | 0 refills | Status: DC

## 2023-06-30 NOTE — Patient Instructions (Addendum)
_______________________________________________________  If your blood pressure at your visit was 140/90 or greater, please contact your primary care physician to follow up on this.  If you are age 30 or younger, your body mass index should be between 19-25. Your Body mass index is 23.09 kg/m. If this is out of the aformentioned range listed, please consider follow up with your Primary Care Provider.  ________________________________________________________  The [place] [facility] providers would like to encourage you to use [facility] to communicate with providers for non-urgent requests or questions.  Due to long hold times on the telephone, sending your provider a message by [facility] may be a faster and more efficient way to get a response.  Please allow 48 business hours for a response.  Please remember that this is for non-urgent requests.  _______________________________________________________  We have sent the following medications to your pharmacy for you to pick up at your convenience:  START: Hydrocortisone suppositories twice daily for 7 days.  You can repeat for 1 week if needed.  Hydrocortisone ointment 2.5% twice daily. (Do not use right now)  Thank you for entrusting me with your care and choosing [facility].  Hyacinth Meeker, PA-C
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
## 2023-07-01 ENCOUNTER — Telehealth: Payer: Self-pay | Admitting: Physician Assistant

## 2023-07-01 NOTE — Telephone Encounter (Signed)
Inbound call from patient stating suppositories that was prescribed yesterday 9/17 has a co pay of $100. Patient requesting a call to discuss if there is an alternative medication. Please advise, thank you

## 2023-07-01 NOTE — Telephone Encounter (Signed)
Inbound call from patient requesting a urgent call back. States he really needs medication to be called in. Please advise, thank you.

## 2023-07-02 ENCOUNTER — Telehealth: Payer: Self-pay | Admitting: Physician Assistant

## 2023-07-02 NOTE — Telephone Encounter (Signed)
Patient advised that work excuse has been sent to his MyChart for his review.  Patient agreed to plan and verbalized understanding.  No further questions.

## 2023-07-02 NOTE — Telephone Encounter (Signed)
Patient called the office stating that the hydrocortisone suppositories we sent to the phamacy are going to cost $100.  Prior to his appointment on 9-17, he was using over-the-counter Preparation H suppositories and hydrocortisone 1% over-the-counter cream.  The patient did not feel that this was helping.    Please advise of any further recommendations.

## 2023-07-02 NOTE — Telephone Encounter (Signed)
View All Conversations on this Encounter  Unk Lightning, [place]  You21 minutes ago (9:35 AM)    We can send in Hydrocortisone 2.5 % and he can use that on a prep h supp bid-thanks-JLL      Patient advised of Jennifer's recommendations to use Hyrdrocrotisone 2.5 % cream with OTC preparation H suppositories twice daily. Patient would prefer to use prescription strength medication and [facility] [address], who stated the price would be much cheaper.  The [facility] would [facility] [address] to see if there is a price difference.  If it is too costly he will use hydrocortisone cream with prep h suppositories twice daily.  Patient agreed to plan and verbalized understanding.  No further questions.

## 2023-07-02 NOTE — Telephone Encounter (Signed)
Inbound call from patient, is requesting a work note from previous visit on 9/17 at 11AM. Patient stated it could be uploaded on his MyChart.

## 2023-07-03 ENCOUNTER — Telehealth: Payer: Self-pay | Admitting: Physician Assistant

## 2023-07-03 ENCOUNTER — Other Ambulatory Visit: Payer: Self-pay

## 2023-07-03 MED ORDER — HYDROCORTISONE ACETATE 25 MG RE SUPP: 25.0000 mg | Freq: Two times a day (BID) | RECTAL | 1 refills | Status: DC

## 2023-07-03 NOTE — Telephone Encounter (Signed)
PT was called in suppositories but they cost way too much at [facility] pharmacy so he would like them sent [facility]. Please advise.

## 2023-07-03 NOTE — Telephone Encounter (Signed)
Patient aware that [facility]cortisone suppositories were [facility] to [facility] [address] per his request.

## 2023-07-17 ENCOUNTER — Telehealth: Payer: Self-pay | Admitting: Physician Assistant

## 2023-07-17 NOTE — Telephone Encounter (Signed)
PT was prescribed Bentyl and it makes him feel worse. He wants to speak with someone to get options for relief. Please advise.

## 2023-07-20 NOTE — Telephone Encounter (Signed)
Called patient in reference to abdominal pain, when spoke to patient he also states he has been bleeding for 3 days with noted clots. Patient also sent pics through MyChart showing bright red blood no tissue. Patient states he is still bleeding at present. Patient instructed to go to the ED with this noted amount of bleeding since 3 days ago. Bentyl was ordered via Dr. Leone Payor during an office visit 06/01/21, with noted refills. The last refill was noted with Ms. Amy Fairlawn, [place] 04/23/21. Patient states is also having abdominal pain and wants medication for that as well. Informed the patient to go to the ED and ask for the medication and they may re-order the medication, main issue at this time is his bleeding. Patient agreed to go to the ED.

## 2023-07-21 ENCOUNTER — Encounter (HOSPITAL_BASED_OUTPATIENT_CLINIC_OR_DEPARTMENT_OTHER): Payer: Self-pay | Admitting: Urology

## 2023-07-21 ENCOUNTER — Emergency Department (HOSPITAL_BASED_OUTPATIENT_CLINIC_OR_DEPARTMENT_OTHER)
Admission: EM | Admit: 2023-07-21 | Discharge: 2023-07-21 | Disposition: A | Discharge status: Home / Self Care | Payer: Medicaid Other | Attending: Emergency Medicine | Admitting: Emergency Medicine

## 2023-07-21 ENCOUNTER — Other Ambulatory Visit: Payer: Self-pay

## 2023-07-21 DIAGNOSIS — K625 Hemorrhage of anus and rectum: Secondary | ICD-10-CM | POA: Diagnosis present

## 2023-07-21 LAB — CBC WITH DIFFERENTIAL/PLATELET
Abs Immature Granulocytes: 0.01 10*3/uL (ref 0.00–0.07)
Basophils Absolute: 0.1 10*3/uL (ref 0.0–0.1)
Basophils Relative: 1 %
Eosinophils Absolute: 0.4 10*3/uL (ref 0.0–0.5)
Eosinophils Relative: 7 %
HCT: 44 % (ref 39.0–52.0)
Hemoglobin: 15.2 g/dL (ref 13.0–17.0)
Immature Granulocytes: 0 %
Lymphocytes Relative: 41 %
Lymphs Abs: 2.4 10*3/uL (ref 0.7–4.0)
MCH: 30.2 pg (ref 26.0–34.0)
MCHC: 34.5 g/dL (ref 30.0–36.0)
MCV: 87.3 fL (ref 80.0–100.0)
Monocytes Absolute: 0.4 10*3/uL (ref 0.1–1.0)
Monocytes Relative: 6 %
Neutro Abs: 2.7 10*3/uL (ref 1.7–7.7)
Neutrophils Relative %: 45 %
Platelets: 236 10*3/uL (ref 150–400)
RBC: 5.04 MIL/uL (ref 4.22–5.81)
RDW: 12.6 % (ref 11.5–15.5)
WBC: 6 10*3/uL (ref 4.0–10.5)
nRBC: 0 % (ref 0.0–0.2)

## 2023-07-21 LAB — COMPREHENSIVE METABOLIC PANEL
ALT: 13 U/L (ref 0–44)
AST: 19 U/L (ref 15–41)
Albumin: 4.5 g/dL (ref 3.5–5.0)
Alkaline Phosphatase: 85 U/L (ref 38–126)
Anion gap: 8 (ref 5–15)
BUN: 14 mg/dL (ref 6–20)
CO2: 26 mmol/L (ref 22–32)
Calcium: 9.2 mg/dL (ref 8.9–10.3)
Chloride: 105 mmol/L (ref 98–111)
Creatinine, Ser: 0.96 mg/dL (ref 0.61–1.24)
GFR, Estimated: >60 mL/min (ref >60)
Glucose, Bld: 83 mg/dL (ref 70–99)
Potassium: 3.9 mmol/L (ref 3.5–5.1)
Sodium: 139 mmol/L (ref 135–145)
Total Bilirubin: 0.7 mg/dL (ref 0.3–1.2)
Total Protein: 6.9 g/dL (ref 6.5–8.1)

## 2023-07-21 LAB — LIPASE, BLOOD: Lipase: 36 U/L (ref 11–51)

## 2023-07-21 MED ORDER — DICYCLOMINE HCL 20 MG PO TABS: ORAL_TABLET | ORAL | 1 refills | Status: DC

## 2023-07-21 NOTE — Telephone Encounter (Signed)
Called patient to see if admitted to hospital. Patient answers the phone and indicated to the nurse he did not go to the hospital yesterday because he had to work, although he states he is still bleeding rectally. Informed the patient that he still needs to go to the hospital due to his bleeding for 4 days now. An f/u appt was scheduled for 10/29/2023 with Ms. Zerita Boers, [place]. Patient states he will go to the ED today, since he is off work.

## 2023-07-21 NOTE — ED Provider Notes (Signed)
[place] EMERGENCY DEPARTMENT AT [facility] Provider Note   CSN: [id number] Arrival date & time: 07/21/23  1425     History Chief Complaint  Patient presents with   Rectal Bleeding    HPI Blake Bradley is a 32 y.o. male presenting for multiple episodes of bright red blood per rectum over this weekend. 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 Has a history of bright red blood per rectum secondary to hemorrhoids.  Has been followed by general surgery considered for banding but ultimately has too much tenesmus to tolerate intervention.  Supportive care has been reinforced over the past few years.  Patient states that this weekend he had approximately 3 days of fairly persistent a.m. bright red blood per rectum that is now resolved.  Was told to come to be evaluated by his GI.  Last colonoscopy 2015 nondiagnostic for acute pathology scheduled for 10-year follow-up next January..   Patient's recorded medical, surgical, social, medication list and allergies were reviewed in the Snapshot window as part of the initial history.   Review of Systems   Review of Systems  Constitutional:  Negative for chills and fever.  HENT:  Negative for ear pain and sore throat.   Eyes:  Negative for pain and visual disturbance.  Respiratory:  Negative for cough and shortness of breath.   Cardiovascular:  Negative for chest pain and palpitations.  Gastrointestinal:  Positive for blood in stool. Negative for abdominal pain and vomiting.  Genitourinary:  Negative for dysuria and hematuria.  Musculoskeletal:  Negative for arthralgias and back pain.  Skin:  Negative for color change and rash.  Neurological:  Negative for seizures and syncope.  All other systems reviewed and are negative.   Physical Exam Updated Vital Signs BP (!) 124/94 (BP Location: Right Arm)   Pulse (!) 55   Temp 98 F (36.7 C)   Resp 18    Ht 6\' 1"  (1.854 m)   Wt 79.3 kg   SpO2 100%   BMI 23.07 kg/m  Physical Exam Vitals and nursing note reviewed.  Constitutional:      General: He is not in acute distress.    Appearance: He is well-developed.  HENT:     Head: Normocephalic and atraumatic.  Eyes:     Conjunctiva/sclera: Conjunctivae normal.  Cardiovascular:     Rate and Rhythm: Normal rate and regular rhythm.     Heart sounds: No murmur heard. Pulmonary:     Effort: Pulmonary effort is normal. No respiratory distress.     Breath sounds: Normal breath sounds.  Abdominal:     Palpations: Abdomen is soft.     Tenderness: There is no abdominal tenderness.  Genitourinary:    Rectum: Normal.  Musculoskeletal:        General: No swelling.     Cervical back: Neck supple.  Skin:    General: Skin is warm and dry.     Capillary Refill: Capillary refill takes less than 2 seconds.  Neurological:     Mental Status: He is alert.  Psychiatric:        Mood and Affect: Mood normal.      ED Course/ Medical Decision Making/ A&P    Procedures Procedures   Medications Ordered in ED Medications - No data to display  Medical Decision Making:   32 year old male presenting with bright red blood per rectum.  Now resolved.  Performed a rectal  exam without any visible blood at this time.  He has a small anal fissure that might be the etiology also has palpable internal hemorrhoids at this time. He has very high resistance at his anus that may be contributing to recurrent events.  He has been referred and follows closely with gastroenterology.  Will perform CBC to evaluate for clinically significant blood loss/anemia, will evaluate liver and kidney function, measured BUN for upper GI etiology and plan for reassessments after all this data Reassessment: All these labs have resulted without any acute pathology.  Observed in ER for 3 hours without focal abnormality. Given resolution of syndrome, reassuring objective findings the  patient is stable for outpatient follow-up with his primary GI and PCP.  Disposition:  I have considered need for hospitalization, however, considering all of the above, I believe this patient is stable for discharge at this time.  Patient/family educated about specific return precautions for given chief complaint and symptoms.  Patient/family educated about follow-up with PCP.     Patient/family expressed understanding of return precautions and need for follow-up. Patient spoken to regarding all imaging and laboratory results and appropriate follow up for these results. All education provided in verbal form with additional information in written form. Time was allowed for answering of patient questions. Patient discharged.    Emergency Department Medication Summary:   Medications - No data to display      Clinical Impression:  1. BRBPR (bright red blood per rectum)      Discharge   Final Clinical Impression(s) / ED Diagnoses Final diagnoses:  BRBPR (bright red blood per rectum)    Rx / DC Orders ED Discharge Orders     None         Glyn Ade, MD 07/21/23 1750

## 2023-07-21 NOTE — ED Triage Notes (Signed)
Pt states called GI doctor Friday due to rectal bleeding , bright red blood, state pain to rectum and generalized abd pain as well  h/o hemmerhoids  On hydrocortisone creams with no relief

## 2023-07-22 ENCOUNTER — Telehealth: Payer: Self-pay | Admitting: *Deleted

## 2023-07-22 NOTE — Telephone Encounter (Signed)
This encounter has been addressed.

## 2023-07-22 NOTE — Telephone Encounter (Signed)
Patient called to see how he is doing and if he went to the ED as discussed on 10/7 and 07/21/23. Received call with questions about receiving Bentyl medication on 07/17/23; after discussing with patient his intention on attending the ED with the expectation that he would receive that medication during his ED visit. No answer at this time, I left a message for the patient to return my call.

## 2023-08-10 ENCOUNTER — Telehealth: Payer: Self-pay | Admitting: *Deleted

## 2023-08-10 NOTE — Telephone Encounter (Signed)
Called patient to inform a mistake was made in scheduling via previous appt and needs to reschedule appt to see Ms. Osborn Coho. The schedule has been changed from 10/29/23 to 11/13/23 at 11:00 am. Patient agreed.

## 2023-09-08 ENCOUNTER — Emergency Department (HOSPITAL_BASED_OUTPATIENT_CLINIC_OR_DEPARTMENT_OTHER): Payer: Medicaid Other | Admitting: Radiology

## 2023-09-08 ENCOUNTER — Other Ambulatory Visit: Payer: Self-pay

## 2023-09-08 ENCOUNTER — Encounter (HOSPITAL_BASED_OUTPATIENT_CLINIC_OR_DEPARTMENT_OTHER): Payer: Self-pay | Admitting: Emergency Medicine

## 2023-09-08 ENCOUNTER — Emergency Department (HOSPITAL_BASED_OUTPATIENT_CLINIC_OR_DEPARTMENT_OTHER)
Admission: EM | Admit: 2023-09-08 | Discharge: 2023-09-09 | Disposition: A | Discharge status: Home / Self Care | Payer: Medicaid Other | Attending: Emergency Medicine | Admitting: Emergency Medicine

## 2023-09-08 DIAGNOSIS — M25551 Pain in right hip: Secondary | ICD-10-CM

## 2023-09-08 DIAGNOSIS — W1839XA Other fall on same level, initial encounter: Secondary | ICD-10-CM | POA: Diagnosis not present

## 2023-09-08 DIAGNOSIS — Y990 Civilian activity done for income or pay: Secondary | ICD-10-CM | POA: Diagnosis not present

## 2023-09-08 MED ORDER — ACETAMINOPHEN 325 MG PO TABS
650.0000 mg | ORAL_TABLET | Freq: Once | ORAL | Status: AC
  Administered 2023-09-08: 650 mg via ORAL
  Filled 2023-09-08: qty 2

## 2023-09-08 NOTE — ED Provider Notes (Signed)
[place] EMERGENCY DEPARTMENT AT [facility] Provider Note   CSN: [id number] Arrival date & time: 09/08/23  2204     History {Add pertinent medical, surgical, social history, OB history to HPI:1} Chief complaint, right hip pain  Blake Bradley is a 32 y.o. male.  HPI   Patient states he was at work when he was moving something and a 40 pound rack almost fell on him.  Patient had to brace with his right leg and right arm.  Patient ended up falling onto the ground.  Patient did have some discomfort in his right arm initially but that has resolved.  He is primarily having pain in his right lateral posterior hip region.  It is very sharp and increases with movement and palpation.  Patient also mentions some sinus congestion this past week  Home Medications Prior to Admission medications   Medication Sig Start Date End Date Taking? Authorizing Provider  ALPRAZolam (XANAX) 0.25 MG tablet Take 0.25 mg by mouth daily as needed. 02/11/21   [provider]  dicyclomine (BENTYL) 20 MG tablet TAKE 1 TABLET BY MOUTH 4 TIMES DAILY BEFORE MEAL(S) AND AT BEDTIME 07/21/23   Glyn Ade, MD  hydrocortisone (ANUSOL-HC) 2.5 % rectal cream Place 1 Application rectally 2 (two) times daily. 06/30/23   Unk Lightning, PA  ISOtretinoin (ACCUTANE) 40 MG capsule Take 40 mg by mouth 2 (two) times daily.    [provider]  methylPREDNISolone (MEDROL DOSEPAK) 4 MG TBPK tablet Use as directed 03/01/21   Pollyann Savoy, MD  metoCLOPramide (REGLAN) 10 MG tablet Take 1 tablet (10 mg total) by mouth every 6 (six) hours as needed for nausea or vomiting (nausea/headache). 03/07/21   Pricilla Loveless, MD  promethazine (PHENERGAN) 25 MG tablet Take 1 tablet (25 mg total) by mouth every 6 (six) hours as needed for nausea or vomiting. 11/14/21   Horton, Mayer Masker, MD  TOBRADEX ophthalmic solution Place 1 drop into both eyes daily. 08/27/20   [provider]      Allergies     Codeine, Erythromycin base, Ketorolac, Morphine and codeine, Prednisone, Doxycycline, and Ibuprofen    Review of Systems   Review of Systems  Physical Exam Updated Vital Signs BP (!) 145/92 (BP Location: Right Arm)   Pulse 80   Temp 98.5 F (36.9 C)   Resp 18   Ht 1.854 m (6\' 1" )   Wt 81.6 kg   SpO2 100%   BMI 23.75 kg/m  Physical Exam Vitals and nursing note reviewed.  Constitutional:      General: He is not in acute distress.    Appearance: He is well-developed.  HENT:     Head: Normocephalic and atraumatic.     Right Ear: Tympanic membrane and external ear normal.     Left Ear: Tympanic membrane and external ear normal.     Nose: No congestion or rhinorrhea.  Eyes:     General: No scleral icterus.       Right eye: No discharge.        Left eye: No discharge.     Conjunctiva/sclera: Conjunctivae normal.  Neck:     Trachea: No tracheal deviation.  Cardiovascular:     Rate and Rhythm: Normal rate and regular rhythm.  Pulmonary:     Effort: Pulmonary effort is normal. No respiratory distress.     Breath sounds: Normal breath sounds. No stridor. No wheezing or rales.  Abdominal:     General: Bowel sounds are normal. There  is no distension.     Palpations: Abdomen is soft.     Tenderness: There is no abdominal tenderness. There is no guarding or rebound.  Musculoskeletal:        General: Tenderness present. No deformity.     Cervical back: Neck supple.     Comments: Tenderness palpation right hip, no ecchymoses noted, no edema  Skin:    General: Skin is warm and dry.     Findings: No rash.  Neurological:     General: No focal deficit present.     Mental Status: He is alert.     Cranial Nerves: No cranial nerve deficit, dysarthria or facial asymmetry.     Sensory: No sensory deficit.     Motor: No abnormal muscle tone or seizure activity.     Coordination: Coordination normal.     Comments: Normal strength and sensation bilateral lower extremities  Psychiatric:         Mood and Affect: Mood normal.    ED Results / Procedures / Treatments   Labs (all labs ordered are listed, but only abnormal results are displayed) Labs Reviewed - No data to display  EKG None  Radiology No results found.  Procedures Procedures  {Document cardiac monitor, telemetry assessment procedure when appropriate:1}  Medications Ordered in ED Medications - No data to display  ED Course/ Medical Decision Making/ A&P   {   Click here for ABCD2, HEART and other calculatorsREFRESH Note before signing :1}                              Medical Decision Making Amount and/or Complexity of Data Reviewed Radiology: ordered.  Risk OTC drugs.   ***  {Document critical care time when appropriate:1} {Document review of labs and clinical decision tools ie heart score, Chads2Vasc2 etc:1}  {Document your independent review of radiology images, and any outside records:1} {Document your discussion with family members, caretakers, and with consultants:1} {Document social determinants of health affecting pt's care:1} {Document your decision making why or why not admission, treatments were needed:1} Final Clinical Impression(s) / ED Diagnoses Final diagnoses:  None    Rx / DC Orders ED Discharge Orders     None

## 2023-09-08 NOTE — ED Triage Notes (Signed)
Patient presents with sinus pressure/congestion x 1 week  Patient also c/o right leg pain s/p injury at work this am. Denies head injury or loc. States "40 pound rack almost fell on me, and I injured my leg trying to keep it from hurting me"

## 2023-09-08 NOTE — Discharge Instructions (Addendum)
Your x-rays did not show any obvious abnormality.  Take over-the-counter medications as needed for pain.  Follow-up with an orthopedic doctor to be rechecked if the symptoms do not resolve over the next week

## 2023-09-23 ENCOUNTER — Other Ambulatory Visit: Payer: Self-pay | Admitting: Physician Assistant

## 2023-09-24 ENCOUNTER — Other Ambulatory Visit: Payer: Self-pay | Admitting: Physician Assistant

## 2023-10-13 NOTE — Progress Notes (Signed)
 [facility] Department [facility] Worker Note  Contact made with: Patient Contacts     Contact Date/Time Type Contact Phone/Fax   10/13/2023 09:37 AM EST Phone (Outgoing) Korinek, Nadeem Romanoski ([place]) [phone] (H)   Left Message        Referral Source: Other Risk List   Encounter type: Telephonic    Notes:  CHW called pt on Risk List. LVM to return call if services needed.   Electronically signed by: Sharene Rumple Craft, CHWorker 10/13/2023 9:41 AM

## 2023-10-29 ENCOUNTER — Ambulatory Visit: Payer: Medicaid Other | Admitting: Physician Assistant

## 2023-11-13 ENCOUNTER — Ambulatory Visit (INDEPENDENT_AMBULATORY_CARE_PROVIDER_SITE_OTHER): Payer: Medicaid Other | Admitting: Physician Assistant

## 2023-11-13 ENCOUNTER — Ambulatory Visit: Payer: Medicaid Other | Admitting: Physician Assistant

## 2023-11-13 ENCOUNTER — Encounter: Payer: Self-pay | Admitting: Physician Assistant

## 2023-11-13 VITALS — BP 100/60 | HR 60 | Ht 72.0 in | Wt 172.4 lb

## 2023-11-13 DIAGNOSIS — K648 Other hemorrhoids: Secondary | ICD-10-CM | POA: Diagnosis not present

## 2023-11-13 DIAGNOSIS — K649 Unspecified hemorrhoids: Secondary | ICD-10-CM

## 2023-11-13 DIAGNOSIS — R1084 Generalized abdominal pain: Secondary | ICD-10-CM | POA: Diagnosis not present

## 2023-11-13 DIAGNOSIS — K625 Hemorrhage of anus and rectum: Secondary | ICD-10-CM | POA: Diagnosis not present

## 2023-11-13 MED ORDER — HYDROCORTISONE ACETATE 25 MG RE SUPP: 25.0000 mg | Freq: Two times a day (BID) | RECTAL | 1 refills | Status: DC

## 2023-11-13 MED ORDER — HYOSCYAMINE SULFATE 0.125 MG SL SUBL
0.1250 mg | SUBLINGUAL_TABLET | SUBLINGUAL | 3 refills | Status: DC | PRN
Start: 2023-11-13 — End: 2024-05-05
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
## 2023-11-13 NOTE — Patient Instructions (Signed)
We have sent the following medications to your pharmacy for you to pick up at your convenience:  Hyoscyamine 0.125mg  SL 1-2 tablets every 4-6 hours as needed  Hydrocortisone Suppositories twice daily for 7 days, May repeat as needed   Referral placed to [facility]  _______________________________________________________  If your blood pressure at your visit was 140/90 or greater, please contact your primary care physician to follow up on this.  _______________________________________________________  If you are age 62 or older, your body mass index should be between 23-30. Your Body mass index is 23.38 kg/m. If this is out of the aforementioned range listed, please consider follow up with your Primary Care Provider.  If you are age 29 or younger, your body mass index should be between 19-25. Your Body mass index is 23.38 kg/m. If this is out of the aformentioned range listed, please consider follow up with your Primary Care Provider.   ________________________________________________________  The [place] [facility] providers would like to encourage you to use [facility] to communicate with providers for non-urgent requests or questions.  Due to long hold times on the telephone, sending your provider a message by [facility] may be a faster and more efficient way to get a response.  Please allow 48 business hours for a response.  Please remember that this is for non-urgent requests.  _______________________________________________________

## 2024-01-19 ENCOUNTER — Other Ambulatory Visit: Payer: Self-pay | Admitting: Physician Assistant

## 2024-03-11 ENCOUNTER — Telehealth: Payer: Self-pay | Admitting: Physician Assistant

## 2024-03-11 MED ORDER — HYDROCORTISONE ACETATE 25 MG RE SUPP: 25.0000 mg | Freq: Two times a day (BID) | RECTAL | 1 refills | Status: DC

## 2024-03-11 NOTE — Telephone Encounter (Signed)
Script for suppositories sent to pharmacy

## 2024-03-11 NOTE — Telephone Encounter (Signed)
 Faxed referral to [facility]. Left message for patient to call office.

## 2024-03-11 NOTE — Telephone Encounter (Signed)
 Patient informed script sent to pharmacy and referral faxed. Patient requested script to sent to [facility]. Resent script to preferred pharmacy.

## 2024-03-11 NOTE — Telephone Encounter (Signed)
 Inbound call from patient stating she is currently having rectal bleeding. States he requested a refill for suppositories but it was only a quantity of 2 instead of 14 and it was a higher price. Patient also is requesting to be advised of information for a referral that was discussed in January. States he has not heard anything from office. Patient is requesting a call back. Please advise, thank you.

## 2024-04-07 ENCOUNTER — Other Ambulatory Visit: Payer: Self-pay | Admitting: Physician Assistant

## 2024-04-14 ENCOUNTER — Other Ambulatory Visit: Payer: Self-pay

## 2024-04-14 DIAGNOSIS — Z79899 Other long term (current) drug therapy: Secondary | ICD-10-CM | POA: Insufficient documentation

## 2024-04-14 DIAGNOSIS — L03114 Cellulitis of left upper limb: Secondary | ICD-10-CM | POA: Insufficient documentation

## 2024-04-14 DIAGNOSIS — M79642 Pain in left hand: Secondary | ICD-10-CM | POA: Diagnosis present

## 2024-04-14 NOTE — ED Triage Notes (Signed)
 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 Was seen at urgent care started on keflex and given an abx. Reports concern for worsening swelling and redness to left hand.

## 2024-04-15 ENCOUNTER — Emergency Department (HOSPITAL_BASED_OUTPATIENT_CLINIC_OR_DEPARTMENT_OTHER)
Admission: EM | Admit: 2024-04-15 | Discharge: 2024-04-15 | Disposition: A | Discharge status: Home / Self Care | Attending: Emergency Medicine | Admitting: Emergency Medicine

## 2024-04-15 DIAGNOSIS — L03114 Cellulitis of left upper limb: Secondary | ICD-10-CM

## 2024-04-15 LAB — CBC WITH DIFFERENTIAL/PLATELET
Abs Immature Granulocytes: 0.03 K/uL (ref 0.00–0.07)
Basophils Absolute: 0.1 K/uL (ref 0.0–0.1)
Basophils Relative: 1 %
Eosinophils Absolute: 0.3 K/uL (ref 0.0–0.5)
Eosinophils Relative: 4 %
HCT: 41.1 % (ref 39.0–52.0)
Hemoglobin: 14.7 g/dL (ref 13.0–17.0)
Immature Granulocytes: 0 %
Lymphocytes Relative: 29 %
Lymphs Abs: 2.3 K/uL (ref 0.7–4.0)
MCH: 31.5 pg (ref 26.0–34.0)
MCHC: 35.8 g/dL (ref 30.0–36.0)
MCV: 88 fL (ref 80.0–100.0)
Monocytes Absolute: 0.7 K/uL (ref 0.1–1.0)
Monocytes Relative: 9 %
Neutro Abs: 4.5 K/uL (ref 1.7–7.7)
Neutrophils Relative %: 57 %
Platelets: 258 K/uL (ref 150–400)
RBC: 4.67 MIL/uL (ref 4.22–5.81)
RDW: 12 % (ref 11.5–15.5)
WBC: 7.9 K/uL (ref 4.0–10.5)
nRBC: 0 % (ref 0.0–0.2)

## 2024-04-15 LAB — COMPREHENSIVE METABOLIC PANEL WITH GFR
ALT: 25 U/L (ref 0–44)
AST: 40 U/L (ref 15–41)
Albumin: 4.5 g/dL (ref 3.5–5.0)
Alkaline Phosphatase: 100 U/L (ref 38–126)
Anion gap: 10 (ref 5–15)
BUN: 20 mg/dL (ref 6–20)
CO2: 28 mmol/L (ref 22–32)
Calcium: 9.2 mg/dL (ref 8.9–10.3)
Chloride: 104 mmol/L (ref 98–111)
Creatinine, Ser: 1.36 mg/dL — ABNORMAL HIGH (ref 0.61–1.24)
GFR, Estimated: >60 mL/min (ref >60)
Glucose, Bld: 67 mg/dL — ABNORMAL LOW (ref 70–99)
Potassium: 3.9 mmol/L (ref 3.5–5.1)
Sodium: 142 mmol/L (ref 135–145)
Total Bilirubin: 0.8 mg/dL (ref 0.0–1.2)
Total Protein: 7 g/dL (ref 6.5–8.1)

## 2024-04-15 MED ORDER — SULFAMETHOXAZOLE-TRIMETHOPRIM 800-160 MG PO TABS
1.0000 | ORAL_TABLET | Freq: Two times a day (BID) | ORAL | 0 refills | Status: AC
Start: 2024-04-15 — End: 2024-04-22

## 2024-04-15 MED ORDER — SULFAMETHOXAZOLE-TRIMETHOPRIM 800-160 MG PO TABS
1.0000 | ORAL_TABLET | Freq: Once | ORAL | Status: AC
  Administered 2024-04-15: 1 via ORAL
  Filled 2024-04-15: qty 1

## 2024-04-15 NOTE — Discharge Instructions (Signed)
 Keep your hand elevated. Use the antibiotics as prescribed Follow-up with the hand specialist in 1 week if no improvement. If you have any worsening pain swelling or redness to the left arm the next 24 to 48 hours please return to ER

## 2024-04-15 NOTE — ED Provider Notes (Signed)
 [place] EMERGENCY DEPARTMENT AT [facility] Provider Note   CSN: [id number] Arrival date & time: 04/14/24  2305     Patient presents with: Hand Pain (L)   Blake Bradley is a 33 y.o. male.   The history is provided by the patient.   Patient reports increasing pain and swelling to his left hand.  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   He was seen in an outside urgent care on July 2 for this. They removed a foreign body that was thought to be due to a thorn.  His tetanus status was updated and he was started on Keflex 4 times daily Since that time the pain and redness are worsening in the hand No fevers or vomiting Denies any injuries.  No trauma.  No other bite wounds or traumatic injuries reported Prior to Admission medications   Medication Sig Start Date End Date Taking? Authorizing Provider  sulfamethoxazole -trimethoprim  (BACTRIM  DS) 800-160 MG tablet Take 1 tablet by mouth 2 (two) times daily for 7 days. 04/15/24 04/22/24 Yes Midge Golas, MD  dicyclomine  (BENTYL ) 20 MG tablet TAKE 1 TABLET BY MOUTH 4 TIMES DAILY BEFORE MEAL(S) AND AT BEDTIME 07/21/23   Jerral Meth, MD  hydrocortisone  (ANUSOL -HC) 25 MG suppository Place 1 suppository (25 mg total) rectally 2 (two) times daily. 03/11/24   Beather Delon Gibson, PA  hyoscyamine  (LEVSIN  SL) 0.125 MG SL tablet Place 1-2 tablets (0.125-0.25 mg total) under the tongue every 4 (four) hours as needed. 11/13/23   Beather Delon Gibson, PA  ISOtretinoin (ACCUTANE) 40 MG capsule Take 40 mg by mouth 2 (two) times daily.    [provider]  PROCTO-MED HC  2.5 % rectal cream INSERT 1 APPLICATION RECTALLY TWICE DAILY 04/07/24   Beather Delon Gibson, PA    Allergies: Codeine, Erythromycin base, Ketorolac , Morphine  and codeine, Prednisone, and Doxycycline    Review of Systems  Updated Vital Signs BP 119/70   Pulse (!) 58   Temp 98.5 F (36.9 C)   Resp 18   SpO2 100%    Physical Exam CONSTITUTIONAL: Well developed/well nourished HEAD: Normocephalic/atraumatic NEURO: Pt is awake/alert/appropriate, moves all extremitiesx4.  No facial droop.   EXTREMITIES: pulses normal/equal, full ROM Patient is able to make a fist with his left hand.  Erythema and tenderness noted along the left middle finger.  No crepitus.  Mild tenderness noted to range of motion.  No tenderness noted to the flexor surface of the left middle finger No bruising No fluctuance   (all labs ordered are listed, but only abnormal results are displayed) Labs Reviewed  COMPREHENSIVE METABOLIC PANEL WITH GFR - Abnormal; Notable for the following components:      Result Value   Glucose, Bld 67 (*)    Creatinine, Ser 1.36 (*)    All other components within normal limits  CBC WITH DIFFERENTIAL/PLATELET    EKG: None  Radiology: No results found.   Procedures   Medications Ordered in the ED  sulfamethoxazole -trimethoprim  (BACTRIM  DS) 800-160 MG per tablet 1 tablet (1 tablet Oral Given 04/15/24 0309)                                    Medical Decision Making Amount and/or Complexity of Data Reviewed Labs: ordered.  Risk Prescription drug management.   Patient presents with worsening pain and redness to the left hand after recent landscaping.  He has  been on Keflex, will add on antibiotic for MRSA coverage.  Due to Doxy allergy, will start Bactrim   No evidence of any abscess. No evidence of any tenosynovitis. No indication for imaging at this time as he has not had any trauma. This does not appear to be fungal in nature  Will refer to hand if no improvement will be.  We discussed strict return precaution      Final diagnoses:  Cellulitis of left hand    ED Discharge Orders          Ordered    sulfamethoxazole -trimethoprim  (BACTRIM  DS) 800-160 MG tablet  2 times daily        04/15/24 0253               Midge Golas, MD 04/15/24 [phone]

## 2024-05-03 ENCOUNTER — Telehealth: Payer: Self-pay

## 2024-05-03 NOTE — Telephone Encounter (Signed)
 Incoming fax with note attached for dicyclomine  20mg  refill stating that the original rx written in ED at [facility]. Blake Bradley requesting refills be sent to [facility]. I have called and left him a voice mail message to call me back to get more details. Wanted to see if he is taking both hyoscyamine  and dicyclomine . The office note from January where he saw [name] PA-C it sounded like the dicyclomine  wasn't making a difference.

## 2024-05-05 MED ORDER — DICYCLOMINE HCL 20 MG PO TABS: ORAL_TABLET | ORAL | 5 refills | Status: DC

## 2024-05-05 NOTE — Telephone Encounter (Signed)
 Send Rx for 20 mg qid #120 5 refills  Explain this is the correct way to take it  If he tales 2 bid it is against advice  Also remind him to be sure to keep his [facility] appt as I was informed he has missed some

## 2024-05-05 NOTE — Telephone Encounter (Signed)
 I left Blake Bradley a detailed voice mail message which he said was okay to do if I didn't get him on the phone with Dr Darilyn instructions and advise. I have sent in the dicyclomine  refill to [facility] on [address].

## 2024-05-05 NOTE — Telephone Encounter (Signed)
 I spoke with Natha and he said that if he takes 2 of the dicyclomine  20mg  twice a day it helps him. He stopped the hyoscyamine  as it didn't help. He does have an appointment upcoming with [facility] on 05/25/2024 for his rectal bleeding. Please advise if I can send in a refill on the dicyclomine  20mg  for a sig of 40mg  BID Sir? Thank you.

## 2024-05-05 NOTE — Telephone Encounter (Signed)
 Patient returning call. Requesting a call back. Please advise.   Thank you

## 2024-05-13 ENCOUNTER — Other Ambulatory Visit: Payer: Self-pay | Admitting: Physician Assistant

## 2024-05-25 ENCOUNTER — Telehealth: Payer: Self-pay | Admitting: Internal Medicine

## 2024-05-25 NOTE — Telephone Encounter (Signed)
 Received call from patient, says he just got released from [facility] and they said he did not have hemorrhoids. Patient wants call back to discuss solutions regarding his bleeding and clotting. Please review and advised.  Thank you

## 2024-05-25 NOTE — Telephone Encounter (Signed)
 The patient was seen today by surgeon. No internal hemorrhoids were found on examination and the patient was referred for PT. He is not in agreement with this diagnosis and wants to know what you recommend. Patient continues to pass blood and clots with bowel movements. Denies constipation.

## 2024-05-25 NOTE — Progress Notes (Signed)
 REFERRING PHYSICIAN:  Eleanore Delon Bradley, *  PROVIDER:  BERNARDA WANDA NED, MD  MRN: [id number] DOB: [birth year] DATE OF ENCOUNTER: 05/25/2024  Subjective   Chief Complaint: Post Operative Visit ([facility])     History of Present Illness: Blake Bradley is a 33 y.o. male who is seen today as an office consultation at the request of Delon Blake for evaluation of Post Operative Visit ([facility]) .  Patient reports approximately 10 to 15 years of difficulty with rectal bleeding.  He reports very irregular bowel habits with constipation and diarrhea.  He has developed rectal bleeding with almost every bowel movement.  This has become very extensive at times.  Recently he has been having trouble with abdominal cramping as well.   Review of Systems: A complete review of systems was obtained from the patient.  I have reviewed this information and discussed as appropriate with the patient.  See HPI as well for other ROS.    Medical History: History reviewed. No pertinent past medical history.  There is no problem list on file for this patient.   History reviewed. No pertinent surgical history.   Allergies  Allergen Reactions  . Amoxicillin  Itching  . Codeine Anaphylaxis  . Erythromycin Base Itching  . Morphine  Hives and Anaphylaxis  . Acetaminophen  Rash    Told to stay away from by provider   Told to stay away from by provider  . Ibuprofen  Other (See Comments)    Told to stay away from by provider   Told to stay away from by provider  . Ketorolac  Rash    Possible rash, itching. Occurred with other meds. Tolerates ibuprofen   . Prednisone Hives and Rash  . Doxycycline Other (See Comments)    Skin peeling    Current Outpatient Medications on File Prior to Visit  Medication Sig Dispense Refill  . dicyclomine  (BENTYL ) 10 mg capsule Take 20 mg by mouth     No current facility-administered medications on file prior to visit.    History reviewed. No pertinent family  history.   Social History   Tobacco Use  Smoking Status Not on file  Smokeless Tobacco Not on file     Social History   Socioeconomic History  . Marital status: Single   Social Drivers of Health   Food Insecurity: Low Risk  (04/26/2024)   Received from [facility]   Hunger Vital Sign   . Within the past 12 months, you worried that your food would run out before you got money to buy more: Never true   . Within the past 12 months, the food you bought just didn't last and you didn't have money to get more. : Never true  Transportation Needs: No Transportation Needs (04/26/2024)   Received from [facility]   . In the past 12 months, has lack of reliable transportation kept you from medical appointments, meetings, work or from getting things needed for daily living? : No   Received from [employer]   Social Network  Housing Stability: Low Risk  (04/26/2024)   Received from [facility]   Housing Stability Vital Sign   . What is your living situation today?: I have a steady place to live   . Think about the place you live. Do you have problems with any of the following? Choose all that apply:: None/None on this list    Objective:    Vitals:   05/25/24 0929  BP: 121/77  Pulse: 66  Resp: 18  Temp: 36.7 C (98 F)  SpO2: 98%  Weight: 75.5 kg (166 lb 6.4 oz)  Height: 185.4 cm (6' 1)  PainSc:   3  PainLoc: Abdomen     Exam Gen: NAD Abd: soft Rectal: No fissure.  Severe tension of both levator muscles of the pelvic floor.  Mildly tender to palpation.  No spasm noted.   Labs, Imaging and Diagnostic Testing:  Procedure: Anoscopy Surgeon: Debby After the risks and benefits were explained, written consent was obtained for above procedure.  A medical assistant chaperone was present thoroughout the entire procedure. Examination chaperoned by Burnard Crete. Anesthesia: none Diagnosis: rectal bleeding Findings: No hemorrhoid disease noted.   Significant sphincter hypertension noted without anal fissure.   Assessment and Plan:  Diagnoses and all orders for this visit:  Pelvic floor tension -     Ambulatory Referral to Physical Therapy  Constipation due to outlet dysfunction 33 year old male with what appears to be significant pelvic floor tension.  On exam today his levators completely blocked his anal canal and he was unable to relax this.  I believe that this is causing issues with constipation and with his rectal bleeding.  There are no signs of hemorrhoid disease on anoscopy.  We discussed today ways to relieve this pelvic floor tension with stretching and with pelvic floor physical therapy.  I believe that his abdominal cramping is related to constipation and outlet dysfunction.  Return if symptoms worsen or fail to improve.   Bernarda JAYSON Debby, MD Colon and Rectal Surgery [facility]

## 2024-05-25 NOTE — Telephone Encounter (Signed)
 Called the patient. No answer. Left voicemail for the patient.

## 2024-05-25 NOTE — Telephone Encounter (Signed)
 I recommend he do pelvic floor PT as recommended by Dr. Debby, the colorectal surgeon.  His muscles in that area (rectum, anus) are very tight and he needs the therapy to help improve defecation.

## 2024-05-26 NOTE — Telephone Encounter (Signed)
 Spoke with the patient. He is interested in a referral to [facility]. Referral information faxed to [facility]  (Phone [phone] fax [phone]) Patient declines to go to PT at this time. He wants to address the rectal bleeding first. He has heard of a cream or a pill that will help with his tight muscles and wants to talk with Dr Avram about that. Appointment 06/16/24.

## 2024-05-26 NOTE — Telephone Encounter (Signed)
 If there are other colorectal surgeons that take his Medicaid he could see them.  [facility] has colorectal surgeons you can try there.  Lets get him a next available follow-up appointment with me as well.  I do think regardless he should do the pelvic PT because he clearly has muscle problems in the anorectal area.

## 2024-05-26 NOTE — Telephone Encounter (Signed)
 Inbound call from patient stating  that he went to see [facility] and was told that he did not have hemorrhoids and that the reason he was bleeding was because of his muscles. Patient is requesting referral to another surgeon if possible. Because he is still bleeding.  Please advise

## 2024-05-31 ENCOUNTER — Telehealth: Payer: Self-pay | Admitting: Physician Assistant

## 2024-05-31 MED ORDER — HYDROCORTISONE ACETATE 25 MG RE SUPP: 25.0000 mg | Freq: Two times a day (BID) | RECTAL | 0 refills | Status: DC | PRN

## 2024-05-31 NOTE — Telephone Encounter (Signed)
 Corrected script

## 2024-05-31 NOTE — Telephone Encounter (Signed)
 Script sent to pharmacy.

## 2024-05-31 NOTE — Addendum Note (Signed)
 Addended by: WILL POWELL CROME on: 05/31/2024 04:27 PM   Modules accepted: Orders

## 2024-05-31 NOTE — Telephone Encounter (Signed)
 Patient called and stated that he is needing a new prescription sent over to his pharmacy in regards to his hydrocortisone  25 MG suppositories. Please advise.

## 2024-05-31 NOTE — Telephone Encounter (Signed)
 Patient requesting prescription be sent over to [facility] on [address].

## 2024-06-07 NOTE — Progress Notes (Signed)
 New Patient Clinic Visit  Assessment/Plan:  Bleeding internal hemorrhoids with pelvic floor dysfunction Exam and history most suggestive of the bleeding coming from his grade 1 internal hemorrhoids.  He does have very high pelvic floor tone and I do think pelvic physical therapy is going to be beneficial to him.  He takes 10 fiber Gummies a day.  We discussed that fiber Gummies drive most of their  power from the laxative sugar alcohols they are made with rather than the actual fiber.  This can cause irregular bowel movements including diarrhea and abdominal cramps.  I have advised him to stop these and to start taking Metamucil powder instead.  I hope this will help him to have more regular bowel movements and help with some of his abdominal cramping.  He sees his gastroenterologist at the beginning of September.  I do think it would be reasonable to consider a flexible sigmoidoscopy or full colonoscopy as his last scope was 6 years ago and this would help us  to more definitively rule out a more proximal source of the bleeding.  I would like to see him back in about 1 month.  We can reassess at that time and discussed the role for rubber band ligation versus excisional hemorrhoidectomy.  Will check a CBC today as well given frequent bleeding to ensure his hgb levels are okay.  Blake SHAUNNA Callas, MD   History: Blake Bradley is a 33 y.o. year-old male who presents today for evaluation of rectal bleeding.  This has been going on for a few years.  He endorses struggling with significant anxiety made worse by the rectal bleeding and his concerns that he may have colon cancer.  When he was 22 he had what sounds like a very severe case of ruptured diverticulitis requiring 3 months in the hospital with IV antibiotics before he was able to undergo surgery. Reports he has had bowel issues since that time.  He does typically have 1 bowel movement per day but the stool consistency can vary from diarrhea to  quite constipated.  He will sometimes have a feeling of an incomplete evacuation and need to go back later to come pleat his bowel movement.  Rarely he will have the sensation of an inability to relax the anus and initiate the bowel movement.  He used to spend over 30 minutes on the toilet but now spends about 10 minutes on the toilet.  He will sometimes have a sharp pain with bowel movements associated with bleeding. He reports taking approximately 10 fiber Gummies every day.  He did have a colonoscopy in 2019.  He has seen another colorectal surgeon who recommended pelvic physical therapy for high tone pelvic floor dysfunction.  Past Medical History:  Diagnosis Date  . Allergy   . Anxiety   . Depression   . Hemorrhoids   . Inflammatory bowel disease     History reviewed. No pertinent surgical history.  ROS A 12-point review of systems was conducted. Pertinent positives and negatives are documented in the HPI. The remainder of the ROS was negative.   PE Vitals:   06/07/24 1528  BP: 131/73  Pulse: 69  Resp: 16  Temp: 98.5 F (36.9 C)    General: No acute distress, alert and awake Head/Eyes: Normocephalic, atraumatic, conjunctiva clear Heart: Regular rate and rhythm Lungs: Unlabored breathing, no accessory muscle use Abdomen: soft, nt, nd Musculoskeletal: No edema to BLE Skin: Warm, no erythema, no rashes   Anorectal (A chaperone Teresia Molt, RN]  was present for the entirety of the examination)   Inspection: Mild skin thinning.  No enlarged external hemorrhoids or obvious anal fissures.  In the left posterior just outside of the anal verge there is a small 1 mm wound that looks like a very tiny ruptured thrombosed hemorrhoid.  There is nothing to suggest an infection or fistula here.  Rectal exam: Very high sphincter and levator tone with mild tenderness along the levator plate.  No palpable masses. Anoscopy: A lubricated anoscope was inserted, and the obturator was  removed.  I performed a circumferential examination of the entire anal canal.  Notable findings include circumferential grade 1 internal hemorrhoids.  The anoscope was removed, and the patient tolerated the procedure well.  I have personally reviewed the reports and images of the patient's latest colonoscopy and/or CT scans. I have additionally reviewed the patient's most recent prior relevant PCP, ED, and specialist notes.  Documentation for time-based billing:  Total time spent of date of service was 45 minutes.  Patient care activities included preparing to see the patient such as reviewing the patient record, obtaining and/or reviewing separately obtained history, performing a medically appropriate history and physical examination, counseling and educating the patient, family, and/or caregiver, ordering prescription medications, tests, or procedures, referring and communicating with other health care providers when not separately reported during the visit, documenting clinical information in the electronic or other health record, independently interpreting results when not separately reported, communicating results to the patient/family/caregiver, and coordinating the care of the patient when not separately reported.

## 2024-06-16 ENCOUNTER — Encounter: Payer: Self-pay | Admitting: Internal Medicine

## 2024-06-16 ENCOUNTER — Ambulatory Visit: Admitting: Internal Medicine

## 2024-06-16 VITALS — BP 116/72 | HR 72 | Ht 73.0 in | Wt 164.4 lb

## 2024-06-16 DIAGNOSIS — R109 Unspecified abdominal pain: Secondary | ICD-10-CM | POA: Diagnosis not present

## 2024-06-16 DIAGNOSIS — K625 Hemorrhage of anus and rectum: Secondary | ICD-10-CM

## 2024-06-16 DIAGNOSIS — K649 Unspecified hemorrhoids: Secondary | ICD-10-CM

## 2024-06-16 DIAGNOSIS — K580 Irritable bowel syndrome with diarrhea: Secondary | ICD-10-CM | POA: Diagnosis not present

## 2024-06-16 DIAGNOSIS — R1084 Generalized abdominal pain: Secondary | ICD-10-CM

## 2024-06-16 DIAGNOSIS — M6289 Other specified disorders of muscle: Secondary | ICD-10-CM

## 2024-06-16 MED ORDER — HYOSCYAMINE SULFATE ER 0.375 MG PO TB12: 0.3750 mg | ORAL_TABLET | Freq: Two times a day (BID) | ORAL | 5 refills | Status: DC

## 2024-06-16 MED ORDER — GLYCOPYRROLATE 2 MG PO TABS: 2.0000 mg | ORAL_TABLET | Freq: Two times a day (BID) | ORAL | 5 refills | Status: AC

## 2024-06-16 MED ORDER — NA SULFATE-K SULFATE-MG SULF 17.5-3.13-1.6 GM/177ML PO SOLN: 1.0000 | ORAL | 0 refills | Status: DC

## 2024-06-16 NOTE — Patient Instructions (Signed)
 We have sent the following medications to your pharmacy for you to pick up at your convenience: Robinul   You have been scheduled for a colonoscopy. Please follow written instructions given to you at your visit today.   If you use inhalers (even only as needed), please bring them with you on the day of your procedure.  DO NOT TAKE 7 DAYS PRIOR TO TEST- Trulicity (dulaglutide) Ozempic, Wegovy (semaglutide) Mounjaro (tirzepatide) Bydureon Bcise (exanatide extended release)  DO NOT TAKE 1 DAY PRIOR TO YOUR TEST Rybelsus (semaglutide) Adlyxin (lixisenatide) Victoza (liraglutide) Byetta (exanatide) ___________________________________________________________________________  I appreciate the opportunity to care for you. Lupita Commander, MD, [facility]
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
## 2024-06-25 ENCOUNTER — Other Ambulatory Visit: Payer: Self-pay | Admitting: Physician Assistant

## 2024-06-30 ENCOUNTER — Other Ambulatory Visit: Payer: Self-pay | Admitting: Physician Assistant

## 2024-07-13 ENCOUNTER — Ambulatory Visit: Admitting: Internal Medicine

## 2024-07-13 ENCOUNTER — Encounter: Payer: Self-pay | Admitting: Internal Medicine

## 2024-07-13 VITALS — BP 131/74 | HR 67 | Temp 98.8°F | Resp 21 | Ht 73.0 in | Wt 164.6 lb

## 2024-07-13 DIAGNOSIS — K648 Other hemorrhoids: Secondary | ICD-10-CM | POA: Diagnosis not present

## 2024-07-13 DIAGNOSIS — K644 Residual hemorrhoidal skin tags: Secondary | ICD-10-CM | POA: Diagnosis not present

## 2024-07-13 DIAGNOSIS — K625 Hemorrhage of anus and rectum: Secondary | ICD-10-CM

## 2024-07-13 MED ORDER — SODIUM CHLORIDE 0.9 % IV SOLN: 500.0000 mL | Freq: Once | INTRAVENOUS | Status: DC

## 2024-07-13 NOTE — Patient Instructions (Addendum)
 I saw your hemorrhoids as before.  They remain small but that does not mean they do not bleed.  I do think following through with pelvic floor physical therapy is a very good idea.  Try not to let this worry you.  Good luck.  I appreciate the opportunity to care for you. Lupita CHARLENA Commander, MD, FACG YOU HAD AN ENDOSCOPIC PROCEDURE TODAY AT THE [facility]:   Refer to the procedure report that was given to you for any specific questions about what was found during the examination.  If the procedure report does not answer your questions, please call your gastroenterologist to clarify.  If you requested that your care partner not be given the details of your procedure findings, then the procedure report has been included in a sealed envelope for you to review at your convenience later.  YOU SHOULD EXPECT: Some feelings of bloating in the abdomen. Passage of more gas than usual.  Walking can help get rid of the air that was put into your GI tract during the procedure and reduce the bloating. If you had a lower endoscopy (such as a colonoscopy or flexible sigmoidoscopy) you may notice spotting of blood in your stool or on the toilet paper. If you underwent a bowel prep for your procedure, you may not have a normal bowel movement for a few days.  Please Note:  You might notice some irritation and congestion in your nose or some drainage.  This is from the oxygen used during your procedure.  There is no need for concern and it should clear up in a day or so.  SYMPTOMS TO REPORT IMMEDIATELY:  Following lower endoscopy (colonoscopy or flexible sigmoidoscopy):  Excessive amounts of blood in the stool  Significant tenderness or worsening of abdominal pains  Swelling of the abdomen that is new, acute  Fever of 100F or higher  Resume previous diet Continue present medications.  Follow up with Dr. Con colorectal surgery and also follow through with pelvic floor PT for high tone pelvic   For  urgent or emergent issues, a gastroenterologist can be reached at any hour by calling [phone]. Do not use MyChart messaging for urgent concerns.    DIET:  We do recommend a small meal at first, but then you may proceed to your regular diet.  Drink plenty of fluids but you should avoid alcoholic beverages for 24 hours.  ACTIVITY:  You should plan to take it easy for the rest of today and you should NOT DRIVE or use heavy machinery until tomorrow (because of the sedation medicines used during the test).    FOLLOW UP: Our staff will call the number listed on your records the next business day following your procedure.  We will call around 7:15- 8:00 am to check on you and address any questions or concerns that you may have regarding the information given to you following your procedure. If we do not reach you, we will leave a message.     If any biopsies were taken you will be contacted by phone or by letter within the next 1-3 weeks.  Please call us  at [phone] if you have not heard about the biopsies in 3 weeks.    SIGNATURES/CONFIDENTIALITY: You and/or your care partner have signed paperwork which will be entered into your electronic medical record.  These signatures attest to the fact that that the information above on your After Visit Summary has been reviewed and is understood.  Full responsibility  of the confidentiality of this discharge information lies with you and/or your care-partner.

## 2024-07-13 NOTE — Op Note (Signed)
 [facility] Patient Name: Blake Bradley Procedure Date: 07/13/2024 2:13 PM MRN: [id number] Endoscopist: Lupita FORBES Commander , MD, [id number] Age: 33 Referring MD:  Date of Birth: [birth year] Gender: Male Account #: [id number] Procedure:                Colonoscopy Indications:              Rectal bleeding Medicines:                Monitored Anesthesia Care Procedure:                Pre-Anesthesia Assessment:                           - Prior to the procedure, a History and Physical                            was performed, and patient medications and                            allergies were reviewed. The patient's tolerance of                            previous anesthesia was also reviewed. The risks                            and benefits of the procedure and the sedation                            options and risks were discussed with the patient.                            All questions were answered, and informed consent                            was obtained. Prior Anticoagulants: The patient has                            taken no anticoagulant or antiplatelet agents. ASA                            Grade Assessment: II - A patient with mild systemic                            disease. After reviewing the risks and benefits,                            the patient was deemed in satisfactory condition to                            undergo the procedure.                           After obtaining informed consent, the colonoscope  was passed under direct vision. Throughout the                            procedure, the patient's blood pressure, pulse, and                            oxygen saturations were monitored continuously. The                            CF [id number] #[id number] was introduced through the anus                            and advanced to the the terminal ileum, with                            identification of the appendiceal orifice and  IC                            valve. The colonoscopy was performed without                            difficulty. The patient tolerated the procedure                            well. The quality of the bowel preparation was                            fair. The terminal ileum, the appendiceal orifice                            and the rectum were photographed. The bowel                            preparation used was SUPREP via split dose                            instruction. Scope In: 2:26:32 PM Scope Out: 2:39:48 PM Scope Withdrawal Time: 0 hours 8 minutes 38 seconds  Total Procedure Duration: 0 hours 13 minutes 16 seconds  Findings:                 The perianal and digital rectal examinations were                            normal.                           External and internal hemorrhoids were found. The                            hemorrhoids were small.                           The terminal ileum appeared normal.  The exam was otherwise without abnormality on                            direct and retroflexion views. Complications:            No immediate complications. Estimated Blood Loss:     Estimated blood loss: none. Impression:               - Preparation of the colon was fair. Left colon                            adequate to good - there was adherent mucoid stool                            in right colon that would not completely flush away                           - External and internal hemorrhoids.                           - The examined portion of the ileum was normal.                           - The examination was otherwise normal on direct                            and retroflexion views. I think this exam is                            sufficinet to support bleeding hemorrhoids as                            opposed to other causes.                           - No specimens collected. Recommendation:           - Patient has a contact  number available for                            emergencies. The signs and symptoms of potential                            delayed complications were discussed with the                            patient. Return to normal activities tomorrow.                            Written discharge instructions were provided to the                            patient.                           - Resume previous  diet.                           - Continue present medications. F/U Dr. 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                           - No recommendation at this time regarding repeat                            colonoscopy due to young age.                           - cc by Fax to Dr. Toribio Con [facility] colorectal                            surgery Lupita FORBES Commander, MD 07/13/2024 2:54:53 PM This report has been signed electronically.

## 2024-07-13 NOTE — Progress Notes (Signed)
 Previous colon patient dropped HR < 40 and BP, premed with Robinul  0.2 mg given IV and pre hydration,   MD updated, vss

## 2024-07-13 NOTE — Progress Notes (Signed)
 Report given to PACU, vss

## 2024-07-13 NOTE — Progress Notes (Signed)
 Pt's states no medical or surgical changes since previsit or office visit.

## 2024-07-13 NOTE — Progress Notes (Signed)
 History and Physical Interval Note:  07/13/2024 2:19 PM  Blake Bradley  has presented today for endoscopic procedure(s), with the diagnosis of  Encounter Diagnosis  Name Primary?   Rectal bleeding Yes  .  The various methods of evaluation and treatment have been discussed with the patient and/or family. After consideration of risks, benefits and other options for treatment, the patient has consented to  the endoscopic procedure(s).   The patient's history has been reviewed, patient examined, no change in status, stable for endoscopic procedure(s).  I have reviewed the patient's chart and labs.  Questions were answered to the patient's satisfaction.     Lupita CHARLENA Commander, MD, NOLIA

## 2024-07-14 ENCOUNTER — Telehealth: Payer: Self-pay

## 2024-07-14 NOTE — Telephone Encounter (Signed)
 Follow up call to pt, lm for pt to call if having any difficulty with normal activities or eating and drinking.  Also to call if any other questions or concerns.
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
## 2024-07-20 ENCOUNTER — Ambulatory Visit: Attending: General Surgery | Admitting: Physical Therapy

## 2024-09-14 ENCOUNTER — Other Ambulatory Visit: Payer: Self-pay | Admitting: Physician Assistant
